# Patient Record
Sex: Female | Born: 1945 | Race: Black or African American | Hispanic: No | Marital: Married | Smoking: Never smoker
Health system: Southern US, Community
[De-identification: ages and names within clinical notes are randomized; demographics above are authoritative.]

## PROBLEM LIST (undated history)

## (undated) DIAGNOSIS — E119 Type 2 diabetes mellitus without complications: Secondary | ICD-10-CM

## (undated) DIAGNOSIS — G4730 Sleep apnea, unspecified: Secondary | ICD-10-CM

## (undated) DIAGNOSIS — I519 Heart disease, unspecified: Secondary | ICD-10-CM

## (undated) DIAGNOSIS — G9389 Other specified disorders of brain: Secondary | ICD-10-CM

## (undated) DIAGNOSIS — I729 Aneurysm of unspecified site: Secondary | ICD-10-CM

## (undated) DIAGNOSIS — I10 Essential (primary) hypertension: Secondary | ICD-10-CM

## (undated) DIAGNOSIS — M1990 Unspecified osteoarthritis, unspecified site: Secondary | ICD-10-CM

## (undated) DIAGNOSIS — M109 Gout, unspecified: Secondary | ICD-10-CM

## (undated) DIAGNOSIS — K219 Gastro-esophageal reflux disease without esophagitis: Secondary | ICD-10-CM

## (undated) DIAGNOSIS — R413 Other amnesia: Secondary | ICD-10-CM

## (undated) DIAGNOSIS — D649 Anemia, unspecified: Secondary | ICD-10-CM

## (undated) DIAGNOSIS — R569 Unspecified convulsions: Secondary | ICD-10-CM

## (undated) DIAGNOSIS — N189 Chronic kidney disease, unspecified: Secondary | ICD-10-CM

## (undated) HISTORY — DX: Other specified disorders of brain: G93.89

## (undated) HISTORY — DX: Other amnesia: R41.3

## (undated) HISTORY — DX: Aneurysm of unspecified site: I72.9

## (undated) HISTORY — DX: Unspecified osteoarthritis, unspecified site: M19.90

## (undated) HISTORY — DX: Type 2 diabetes mellitus without complications: E11.9

## (undated) HISTORY — DX: Essential (primary) hypertension: I10

## (undated) HISTORY — DX: Gout, unspecified: M10.9

## (undated) HISTORY — DX: Unspecified convulsions: R56.9

## (undated) HISTORY — DX: Heart disease, unspecified: I51.9

## (undated) HISTORY — PX: BREAST SURGERY: SHX581

## (undated) HISTORY — PX: TONSILLECTOMY: SUR1361

---

## 1976-12-19 DIAGNOSIS — I729 Aneurysm of unspecified site: Secondary | ICD-10-CM

## 1976-12-19 HISTORY — DX: Aneurysm of unspecified site: I72.9

## 2009-09-08 ENCOUNTER — Ambulatory Visit: Payer: Self-pay | Admitting: Cardiology

## 2012-10-17 DIAGNOSIS — Z79899 Other long term (current) drug therapy: Secondary | ICD-10-CM | POA: Insufficient documentation

## 2013-12-31 ENCOUNTER — Ambulatory Visit (INDEPENDENT_AMBULATORY_CARE_PROVIDER_SITE_OTHER): Payer: Medicare Other | Admitting: Cardiovascular Disease

## 2013-12-31 ENCOUNTER — Encounter: Payer: Self-pay | Admitting: Cardiovascular Disease

## 2013-12-31 VITALS — BP 126/68 | HR 73 | Ht 63.0 in | Wt 195.7 lb

## 2013-12-31 DIAGNOSIS — E119 Type 2 diabetes mellitus without complications: Secondary | ICD-10-CM

## 2013-12-31 DIAGNOSIS — Z9989 Dependence on other enabling machines and devices: Secondary | ICD-10-CM

## 2013-12-31 DIAGNOSIS — E785 Hyperlipidemia, unspecified: Secondary | ICD-10-CM

## 2013-12-31 DIAGNOSIS — G4733 Obstructive sleep apnea (adult) (pediatric): Secondary | ICD-10-CM

## 2013-12-31 DIAGNOSIS — I10 Essential (primary) hypertension: Secondary | ICD-10-CM

## 2013-12-31 DIAGNOSIS — I671 Cerebral aneurysm, nonruptured: Secondary | ICD-10-CM

## 2013-12-31 NOTE — Patient Instructions (Addendum)
Your physician has recommended that you have a sleep study. This test records several body functions during sleep, including: brain activity, eye movement, oxygen and carbon dioxide blood levels, heart rate and rhythm, breathing rate and rhythm, the flow of air through your mouth and nose, snoring, body muscle movements, and chest and belly movement.  This will be scheduled @ [facility].  Your physician recommends that you schedule a follow-up appointment in: 3 MONTHS.

## 2014-01-01 ENCOUNTER — Telehealth: Payer: Self-pay | Admitting: Cardiovascular Disease

## 2014-01-01 NOTE — Telephone Encounter (Signed)
Her husband called and wanted to know if her sleep study have been scheduled?He did not leave his name,just said he was her husband.

## 2014-01-01 NOTE — Telephone Encounter (Signed)
Forward to scheduler-deedie and kay  Has appt been made contact patient

## 2014-01-13 ENCOUNTER — Encounter: Payer: Self-pay | Admitting: Cardiovascular Disease

## 2014-01-21 ENCOUNTER — Encounter: Payer: Self-pay | Admitting: Cardiovascular Disease

## 2014-01-21 DIAGNOSIS — Z9989 Dependence on other enabling machines and devices: Secondary | ICD-10-CM

## 2014-01-21 DIAGNOSIS — I671 Cerebral aneurysm, nonruptured: Secondary | ICD-10-CM | POA: Insufficient documentation

## 2014-01-21 DIAGNOSIS — E119 Type 2 diabetes mellitus without complications: Secondary | ICD-10-CM | POA: Insufficient documentation

## 2014-01-21 DIAGNOSIS — E785 Hyperlipidemia, unspecified: Secondary | ICD-10-CM | POA: Insufficient documentation

## 2014-01-21 DIAGNOSIS — I10 Essential (primary) hypertension: Secondary | ICD-10-CM | POA: Insufficient documentation

## 2014-01-21 DIAGNOSIS — G4733 Obstructive sleep apnea (adult) (pediatric): Secondary | ICD-10-CM | POA: Insufficient documentation

## 2014-01-21 NOTE — Progress Notes (Signed)
Patient ID: Tara Holmes, female   DOB: [birth year], 68 y.o.   MRN: [id number]     HPI: Tara Holmes is a 68 y.o. female who presents to the office today for cardiology evaluation.  [name] is a 68 year old Afro-American female who has a history of hypertension for greater than 25 years, and family history for diabetes mellitus, hyperlipidemia, obesity, and is status post clipping of remote left brain aneurysm. She has a history of obstructive sleep apnea which was originally diagnosed over 7 years ago. This study had been done in [place].  Apparently, it she is now using [facility]. She needs a face-to-face evaluation in order to get supplies. Typically, she goes to bed at 9:30 to 10 PM and wakes up between 5:30 and 6:00 in the morning. Often times she wakes up at least 2 times per night. She has been using a fullface mask.  Presently, I calculated Epworth Sleepiness Scale score today which endorsed at 9. She had the highest chance of dozing while lying down to rest in the afternoon if circumstance permit as well as a passenger in a car for an hour without a break. Moderate chance of dozing while watching television and a slight chance of dozing when sitting quietly after lunch without alcohol. At times she has been without CPAP therapy. Her S 8 Elite CPAP unitis now over 7 years.  She has a history of hyperlipidemia for which she's been on Mevacor 40 mg, WelChol and fish oil capsules. Her blood pressure has been treated with losartan 100 mg, amlodipine 10 mg, as well as furosemide 40 mg. She is diabetic on Januvia.  She denies chest pressure. There is no seizure activity.  Presently, she works as a Oceanographer.  History reviewed. No pertinent past medical history.  History reviewed. No pertinent past surgical history.  No Known Allergies  Current Outpatient Prescriptions  Medication Sig Dispense Refill  . amLODipine (NORVASC) 10 MG tablet Take 10 mg  by mouth daily. Takes 1/2 tablet daily.      . Calcium Carbonate-Vitamin D (CALCIUM-VITAMIN D) 500-200 MG-UNIT per tablet Take 1 tablet by mouth 2 (two) times daily.      . colesevelam (WELCHOL) 625 MG tablet Take 625 mg by mouth 2 (two) times daily with a meal. 3 tabs      . furosemide (LASIX) 40 MG tablet Take 40 mg by mouth.      Marland Kitchen glimepiride (AMARYL) 2 MG tablet Take 2 mg by mouth daily with breakfast. Takes 1/2 tablet twice daily      . losartan (COZAAR) 100 MG tablet Take 100 mg by mouth daily.      Marland Kitchen lovastatin (MEVACOR) 40 MG tablet Take 40 mg by mouth at bedtime.      Marland Kitchen omega-3 acid ethyl esters (LOVAZA) 1 G capsule Take 1 g by mouth 2 (two) times daily.      Marland Kitchen oxybutynin (DITROPAN) 5 MG tablet Take 5 mg by mouth 2 (two) times daily.      . sitaGLIPtin (JANUVIA) 50 MG tablet Take 50 mg by mouth daily.       No current facility-administered medications for this visit.    History   Social History  . Marital Status: Married    Spouse Name: N/A    Number of Children: N/A  . Years of Education: N/A   Occupational History  . Not on file.   Social History Main Topics  . Smoking status: Never Smoker   .  Smokeless tobacco: Never Used  . Alcohol Use: No  . Drug Use: Not on file  . Sexual Activity: Not on file   Other Topics Concern  . Not on file   Social History Narrative  . No narrative on file    History reviewed. No pertinent family history.  ROS is negative for fevers, chills or night sweats. She denies headaches. She denies change in vision. She denies change in hearing. There is no presyncope or syncope. There is no angina symptoms. She denies nausea or vomiting. She denies blood in stool or urine. There is no claudication. There is no myalgias. She denies bruxism or restless legs. Without CPAP she snores. She denies definitive excessive daytime sleepiness.  Other comprehensive 14 point system review is negative.  PE BP 126/68  Pulse 73  Ht 5\' 3"  (1.6 m)  Wt 195  lb 11.2 oz (88.769 kg)  BMI 34.68 kg/m2  General: Alert, oriented, no distress.  Skin: normal turgor, no rashes HEENT: Normocephalic, atraumatic. Pupils round and reactive; sclera anicteric;no lid lag. Extraocular muscles intact. Nose without nasal septal hypertrophy Mouth/Parynx benign; Mallinpatti scale 3 Neck: No JVD, no carotid bruits; normal carotid upstroke Lungs: clear to ausculatation and percussion; no wheezing or rales Chest wall: no tenderness to palpitation Heart: RRR, s1 s2 normal 1/6 systolic murmur, unchanged; no rub or thrill or heave. Abdomen: soft, nontender; no hepatosplenomehaly, BS+; abdominal aorta nontender and not dilated by palpation. Back: no CVA tenderness Pulses 2+ Extremities: no clubbing cyanosis or edema, Homan's sign negative  Neurologic: grossly nonfocal; cranial nerves grossly normal. Psychologic: normal affect and mood.  ECG (independently read by me): Sinus rhythm. Nondiagnostic T changes.  LABS:  BMET No results found for this basename: na, k, cl, co2, glucose, bun, creatinine, calcium, gfrnonaa, gfraa     Hepatic Function Panel  No results found for this basename: prot, albumin, ast, alt, alkphos, bilitot, bilidir, ibili     CBC No results found for this basename: wbc, rbc, hgb, hct, plt, mcv, mch, mchc, rdw, neutrabs, lymphsabs, monoabs, eosabs, basosabs     BNP No results found for this basename: probnp    Lipid Panel  No results found for this basename: chol, trig, hdl, cholhdl, vldl, ldlcalc     RADIOLOGY: No results found.    ASSESSMENT AND PLAN: [name] is a 68 year old Afro-American female who has a history of diabetes mellitus, hyperlipidemia, hypertension, as well as documented obstructive sleep apnea for at least 7 years duration. Her blood pressure today is well controlled on amlodipine, losartan, and furosemide. She is tolerating lovastatin, lovaza, and WelChol for hyperlipidemia. She now is using [facility] for her  medical device company.  Her Epworth Sleepiness Scale endorsed at 9. She typically wakes up several times per night. His been over 7 years since her initial sleep study. She initially had been set at set pressure of 15 cm water pressure. A download done last year showed an AHI of 1.7 at that time she was averaging 7 hours and 32 minutes use. I am recommending a followup sleep study which will be done as a split night protocol if she meets criteria. She may benefit from a new CPAP machine. She will be getting her supplies from [facility]. I'll see her in several months for followup evaluation.     Troy Sine, MD, [facility]  01/21/2014 4:35 PM

## 2014-01-27 ENCOUNTER — Telehealth: Payer: Self-pay | Admitting: *Deleted

## 2014-01-27 NOTE — Telephone Encounter (Signed)
Returned call and pt verified x 2 w/ pt's husband, Percell Miller.  Stated he wanted to know if pt's sleep study results have been sent to [facility] in [place], [place].  Husband informed this RN is not sure of where pt is in the process and that Mariann Laster will be notified to contact him w/ an update.  Verbalized understanding and wanted to make sure info is sent to [facility] in [place], [place].  Message forwarded to [place], [place].

## 2014-01-27 NOTE — Telephone Encounter (Signed)
Pt husband called in regards to his wife's sleep study results. Also he wanted to know if Dr. Claiborne Billings sent it over to [facility] so she can get the stuff that she needs.  TK

## 2014-01-30 NOTE — Telephone Encounter (Signed)
Office note given to husband today while here getting a test. I have been unable to fax to [facility] in [place], [place] due to wrong number.

## 2014-03-19 ENCOUNTER — Ambulatory Visit (INDEPENDENT_AMBULATORY_CARE_PROVIDER_SITE_OTHER): Payer: Medicare Other | Admitting: Cardiovascular Disease

## 2014-03-19 ENCOUNTER — Encounter: Payer: Self-pay | Admitting: Cardiovascular Disease

## 2014-03-19 VITALS — BP 122/60 | HR 81 | Ht 63.0 in | Wt 193.3 lb

## 2014-03-19 DIAGNOSIS — G4733 Obstructive sleep apnea (adult) (pediatric): Secondary | ICD-10-CM

## 2014-03-19 DIAGNOSIS — E785 Hyperlipidemia, unspecified: Secondary | ICD-10-CM

## 2014-03-19 DIAGNOSIS — Z9989 Dependence on other enabling machines and devices: Principal | ICD-10-CM

## 2014-03-19 DIAGNOSIS — I10 Essential (primary) hypertension: Secondary | ICD-10-CM

## 2014-03-19 DIAGNOSIS — E119 Type 2 diabetes mellitus without complications: Secondary | ICD-10-CM

## 2014-03-19 NOTE — Patient Instructions (Signed)
Your physician recommends that you schedule a follow-up appointment in:2-3 months in a sleep clinic.

## 2014-03-26 ENCOUNTER — Telehealth: Payer: Self-pay | Admitting: Cardiovascular Disease

## 2014-03-26 NOTE — Telephone Encounter (Signed)
Please call-need a letter for Medicare for her C Pap> Please call,he will give you the details.

## 2014-03-26 NOTE — Telephone Encounter (Signed)
Forward to [place] CMA

## 2014-03-27 ENCOUNTER — Telehealth: Payer: Self-pay | Admitting: *Deleted

## 2014-03-27 NOTE — Telephone Encounter (Signed)
Faxed CPAP supply order back to [facility].

## 2014-03-27 NOTE — Telephone Encounter (Signed)
Amber can you call this patient and tell her that I sent them the signed orders late yesterday. Thanks.

## 2014-03-28 ENCOUNTER — Telehealth: Payer: Self-pay | Admitting: Cardiovascular Disease

## 2014-03-28 NOTE — Telephone Encounter (Signed)
Returned call and pt verified x 2.  Pt informed, per Mariann Laster, signed orders sent late yesterday.  Pt verbalized understanding and agreed w/ plan.

## 2014-03-28 NOTE — Telephone Encounter (Signed)
Calling back because they are asking for an additional form in order to get the c-pap machine . Please Call   Thanks

## 2014-03-28 NOTE — Telephone Encounter (Signed)
Returned call and pt verified x 2 w/ Percell Miller, pt's husband.  Stated [facility] needs another form completed for re-certification of pt's CPAP.  Stated he will have them fax it over again.  Fax number to medical records given and husband informed Mariann Laster will be notified.  Verbalized understanding.  Message forwarded to [place], [place].  Message forwarded to Medical Records to look out for fax and place on Wanda's desk once received.

## 2014-03-31 NOTE — Telephone Encounter (Signed)
Called and left message that I faxed the information requested by [facility] on 03/27/14. I telephoned [facility] and spoke with a rep informing her that I had done so. She states that she received the order form but not the questionnaire portion. Told her that I will resend the information, however the two pages were faxed at the same time. Information refaxed and receipt confirmed while on the phone with [facility].

## 2014-04-06 ENCOUNTER — Encounter: Payer: Self-pay | Admitting: Cardiovascular Disease

## 2014-04-06 NOTE — Progress Notes (Signed)
Patient ID: Tara Holmes, female   DOB: 1946/12/11, 68 y.o.   MRN: [id number]      HPI: Tara Holmes is a 68 y.o. female who presents to the office today for cardiology evaluation.  Tara Holmes is a 68 year old Afro-American female with a history of hypertension for greater than 25 years, family history for diabetes mellitus, hyperlipidemia, obesity, and is status post clipping of remote left brain aneurysm. She has a history of obstructive sleep apnea which was originally diagnosed over 7 years ago. This study had been done in [place].   RECENT  Epworth Sleepiness Scale score endorsed at 9. She had the highest chance of dozing while lying down to rest in the afternoon if circumstance permit as well as a passenger in a car for an hour without a break. Moderate chance of dozing while watching television and a slight chance of dozing when sitting quietly after lunch without alcohol. At times she has been without CPAP therapy.  When I last saw her, S 8 Elite CPAP unitis was over 52 years old and at times.  There was some intermittent malfunction.  I referred her for a noose split-night CPAP titration trial which she had done.  I do not have the specifics of this recent evaluation currently available, but this did verify continued significant obstructive sleep apnea.  She has not yet been given her new CPAP unit since she plans to use [facility] in [place], [place].  They have been contacted regarding the specifics of her recent sleep study and she will obtain a new CPAP unit and supplies.  She has a history of hyperlipidemia for which she's been on Mevacor 40 mg, WelChol and fish oil capsules. Her blood pressure has been treated with losartan 100 mg, amlodipine 10 mg, as well as furosemide 40 mg. She is diabetic on Januvia.  She denies chest pressure. There is no seizure activity.  Presently, she works as a Oceanographer.  History reviewed. No pertinent past medical  history.  History reviewed. No pertinent past surgical history.  No Known Allergies  Current Outpatient Prescriptions  Medication Sig Dispense Refill  . amLODipine (NORVASC) 10 MG tablet Take 10 mg by mouth daily. Takes 1/2 tablet daily.      . Calcium Carbonate-Vitamin D (CALCIUM-VITAMIN D) 500-200 MG-UNIT per tablet Take 1 tablet by mouth 2 (two) times daily.      . carvedilol (COREG) 25 MG tablet Take 1 tablet by mouth 2 (two) times daily.      . furosemide (LASIX) 40 MG tablet Take 40 mg by mouth.      Marland Kitchen glimepiride (AMARYL) 2 MG tablet Take 2 mg by mouth daily with breakfast. Takes 1/2 tablet twice daily      . linagliptin (TRADJENTA) 5 MG TABS tablet Take 5 mg by mouth daily.      Marland Kitchen losartan (COZAAR) 100 MG tablet Take 100 mg by mouth daily.      Marland Kitchen lovastatin (MEVACOR) 40 MG tablet Take 40 mg by mouth at bedtime.      Marland Kitchen omega-3 acid ethyl esters (LOVAZA) 1 G capsule Take 1 g by mouth 2 (two) times daily.      Marland Kitchen oxybutynin (DITROPAN) 5 MG tablet Take 5 mg by mouth 2 (two) times daily.       No current facility-administered medications for this visit.    History   Social History  . Marital Status: Married    Spouse Name: N/A    Number of Children:  N/A  . Years of Education: N/A   Occupational History  . Not on file.   Social History Main Topics  . Smoking status: Never Smoker   . Smokeless tobacco: Never Used  . Alcohol Use: No  . Drug Use: Not on file  . Sexual Activity: Not on file   Other Topics Concern  . Not on file   Social History Narrative  . No narrative on file    History reviewed. No pertinent family history.  ROS is negative for fevers, chills or night sweats. She denies headaches. She denies change in vision. She denies change in hearing.  She is unaware of lymphadenopathy There is no presyncope or syncope. There is no angina symptoms. She denies nausea or vomiting.  She denies change in bowel or bladder habits.  She denies blood in stool or urine.  There is no claudication. There is no myalgias.  She is diabetic.  She denies cold or heat intolerance. She denies bruxism or restless legs. Without CPAP she snores. She denies definitive excessive daytime sleepiness.  She denied hypnagogic hallucinations.  Other comprehensive 14 point system review is negative.  PE BP 122/60  Pulse 81  Ht 5\' 3"  (1.6 m)  Wt 193 lb 4.8 oz (87.68 kg)  BMI 34.25 kg/m2  General: Alert, oriented, no distress.  Skin: normal turgor, no rashes HEENT: Normocephalic, atraumatic. Pupils round and reactive; sclera anicteric;no lid lag. Extraocular muscles intact. Nose without nasal septal hypertrophy Mouth/Parynx benign; Mallinpatti scale 3 Neck: No JVD, no carotid bruits; normal carotid upstroke Lungs: clear to ausculatation and percussion; no wheezing or rales Chest wall: no tenderness to palpitation Heart: RRR, s1 s2 normal 1/6 systolic murmur, unchanged; no rub or thrill or heave. Abdomen: soft, nontender; no hepatosplenomehaly, BS+; abdominal aorta nontender and not dilated by palpation. Back: no CVA tenderness Pulses 2+ Extremities: no clubbing cyanosis or edema, Homan's sign negative  Neurologic: grossly nonfocal; cranial nerves grossly normal. Psychologic: normal affect and mood.  ECG (independently read by me): Normal sinus rhythm 81 beats per minute.  Nonspecific ST changes in leads 1, AVL and V6.  Prior 12/31/2013 ECG (independently read by me): Sinus rhythm. Nondiagnostic T changes.  LABS:  BMET No results found for this basename: na,  k,  cl,  co2,  glucose,  bun,  creatinine,  calcium,  gfrnonaa,  gfraa     Hepatic Function Panel  No results found for this basename: prot,  albumin,  ast,  alt,  alkphos,  bilitot,  bilidir,  ibili     CBC No results found for this basename: wbc,  rbc,  hgb,  hct,  plt,  mcv,  mch,  mchc,  rdw,  neutrabs,  lymphsabs,  monoabs,  eosabs,  basosabs     BNP No results found for this basename: probnp     Lipid Panel  No results found for this basename: chol,  trig,  hdl,  cholhdl,  vldl,  ldlcalc     RADIOLOGY: No results found.    ASSESSMENT AND PLAN: Tara Holmes is a 68 year old Afro-American female who has a history of diabetes mellitus, hyperlipidemia, hypertension, as well as documented obstructive sleep apnea for at least 7 years duration. Her blood pressure today is well controlled on amlodipine, losartan, and furosemide. She is tolerating lovastatin, lovaza, and WelChol for hyperlipidemia.  She recently underwent a followup sleep study, which again confirms significant obstructive sleep apnea.  Remotely, she had used [employer] if her in the company.  She now  tells me she will be using [facility].  We contacted [facility] to make certain, that they've received the results of her most recent sleep study and recommendation for CPAP therapy based on her CPAP titration.  I will see her back in the office in the sleep clinic following initiation of therapy and further recommendations will remain at that time.   Troy Sine, MD, [facility]  04/06/2014 6:55 PM

## 2014-04-30 NOTE — Addendum Note (Signed)
Addended byLauralee Evener. on: 04/30/2014 01:38 PM   Modules accepted: Orders

## 2014-05-02 ENCOUNTER — Telehealth: Payer: Self-pay | Admitting: *Deleted

## 2014-05-02 ENCOUNTER — Telehealth: Payer: Self-pay | Admitting: Cardiovascular Disease

## 2014-05-02 NOTE — Telephone Encounter (Signed)
Faxed order to change pressure setting to auto 11-20 cmH20 for 2 weeks. Download and fax results to [phone].

## 2014-05-02 NOTE — Telephone Encounter (Signed)
fowarded to [facility] [place]

## 2014-05-02 NOTE — Telephone Encounter (Signed)
Called and spoke with Butch Penny @ [facility] in [place]. [phone])  Requested that she have a respiratory therapist to telephone patient today @ 4 ( if possible)  to see if they can assist her with her CPAP issues. If after troubleshooting with her and they need order for pressure change then send it to Dr. Claiborne Billings for signature.

## 2014-05-02 NOTE — Telephone Encounter (Signed)
Returned a call and spoke with patients husband informing him that [facility] will be calling his wife to see if they can assist her.

## 2014-05-02 NOTE — Telephone Encounter (Signed)
Returned a call to [employer]. She recommends setting the patients machine on auto for a few weeks to see what patient is requiring. Advised them to send order for Dr. Claiborne Billings to sign. Fax number given to her.

## 2014-05-02 NOTE — Telephone Encounter (Signed)
She is stating that she sleeps about the first few hours then she wakes up and is up for the rest of the night , and that she has been snoring and she sleeps with the machine on . The number that was put in her machine was a 9 and she feels like that particular number is not working . She thinks her other machine was on 15 and she didn't have any problems with that one. She is now using a new machine . She is asking that someone please call her at 4pm and she will be near her phone . 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   Thanks

## 2014-05-02 NOTE — Telephone Encounter (Signed)
[facility] needs to speak with you about this patient.

## 2014-06-09 ENCOUNTER — Telehealth: Payer: Self-pay | Admitting: Diagnostic Neuroimaging

## 2014-06-09 NOTE — Telephone Encounter (Signed)
Spoke with patient and explained that she had not been seen in our office since 09/2012 (f/u on eval for seizure activity)and the head pain would be a new problem, suggested that she contact her PCP to have them send a new referral. she verbalized understanding and said that she would.

## 2014-06-09 NOTE — Telephone Encounter (Signed)
Patient has been having pain in her head above where she had an aneurism--not a constant ache---please call.

## 2014-06-09 NOTE — Telephone Encounter (Signed)
fyi

## 2014-06-18 ENCOUNTER — Encounter: Payer: Self-pay | Admitting: Diagnostic Neuroimaging

## 2014-06-18 ENCOUNTER — Encounter (INDEPENDENT_AMBULATORY_CARE_PROVIDER_SITE_OTHER): Payer: Self-pay

## 2014-06-18 ENCOUNTER — Ambulatory Visit (INDEPENDENT_AMBULATORY_CARE_PROVIDER_SITE_OTHER): Payer: Medicare Other | Admitting: Diagnostic Neuroimaging

## 2014-06-18 VITALS — BP 142/76 | HR 74 | Temp 97.1°F | Ht 63.0 in | Wt 197.0 lb

## 2014-06-18 DIAGNOSIS — G44209 Tension-type headache, unspecified, not intractable: Secondary | ICD-10-CM
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
## 2015-07-07 ENCOUNTER — Encounter: Payer: Self-pay | Admitting: Cardiovascular Disease

## 2015-08-11 ENCOUNTER — Telehealth: Payer: Self-pay | Admitting: *Deleted

## 2015-08-11 ENCOUNTER — Encounter: Payer: Self-pay | Admitting: Cardiovascular Disease

## 2015-08-11 NOTE — Telephone Encounter (Signed)
-----   Message from Troy Sine, MD sent at 08/11/2015 11:20 AM EDT ----- Compliant; good result with CPAP

## 2015-08-12 ENCOUNTER — Telehealth: Payer: Self-pay | Admitting: Cardiovascular Disease

## 2015-08-12 NOTE — Telephone Encounter (Signed)
Pt is returning Wanda's call from yesterday . Please f/u with her   Thanks

## 2015-08-12 NOTE — Telephone Encounter (Signed)
Routed to [place]. No answer when called patient today 8/24

## 2015-08-13 NOTE — Telephone Encounter (Signed)
Please call.

## 2015-08-13 NOTE — Telephone Encounter (Addendum)
Pt is returning Wanda's call. She said that she can be reached on her work number [phone] before 9:30am .

## 2015-08-13 NOTE — Telephone Encounter (Signed)
Routed to [place]

## 2015-08-13 NOTE — Telephone Encounter (Signed)
Pt is calling back stating that she the message can be left with her husband at [phone]

## 2015-08-13 NOTE — Telephone Encounter (Signed)
Routed to [place]

## 2015-08-14 NOTE — Telephone Encounter (Signed)
-----   Message from Troy Sine, MD sent at 08/11/2015 11:20 AM EDT ----- Compliant; good result with CPAP

## 2015-08-14 NOTE — Telephone Encounter (Signed)
Informed patient's husband CPAP compliance report is good. No changes needed at this time.

## 2016-04-28 ENCOUNTER — Ambulatory Visit: Payer: Medicare Other | Admitting: Cardiovascular Disease

## 2016-05-06 ENCOUNTER — Ambulatory Visit (INDEPENDENT_AMBULATORY_CARE_PROVIDER_SITE_OTHER): Payer: Medicare Other | Admitting: Cardiovascular Disease

## 2016-05-06 ENCOUNTER — Encounter: Payer: Self-pay | Admitting: Cardiovascular Disease

## 2016-05-06 VITALS — BP 107/56 | HR 68 | Ht 63.0 in | Wt 188.6 lb

## 2016-05-06 DIAGNOSIS — I10 Essential (primary) hypertension: Secondary | ICD-10-CM | POA: Diagnosis not present

## 2016-05-06 DIAGNOSIS — I359 Nonrheumatic aortic valve disorder, unspecified: Secondary | ICD-10-CM

## 2016-05-06 DIAGNOSIS — E119 Type 2 diabetes mellitus without complications: Secondary | ICD-10-CM

## 2016-05-06 DIAGNOSIS — G4733 Obstructive sleep apnea (adult) (pediatric): Secondary | ICD-10-CM | POA: Diagnosis not present

## 2016-05-06 DIAGNOSIS — E785 Hyperlipidemia, unspecified: Secondary | ICD-10-CM

## 2016-05-06 DIAGNOSIS — Z9989 Dependence on other enabling machines and devices: Secondary | ICD-10-CM

## 2016-05-06 NOTE — Patient Instructions (Signed)
Your physician has requested that you have an echocardiogram. Echocardiography is a painless test that uses sound waves to create images of your heart. It provides your doctor with information about the size and shape of your heart and how well your heart's chambers and valves are working. This procedure takes approximately one hour. There are no restrictions for this procedure. This will be scheduled at [facility].  Your physician wants you to follow-up in: 6 months or sooner if needed. You will receive a reminder letter in the mail two months in advance. If you don't receive a letter, please call our office to schedule the follow-up appointment.  If you need a refill on your cardiac medications before your next appointment, please call your pharmacy.

## 2016-05-08 ENCOUNTER — Encounter: Payer: Self-pay | Admitting: Cardiovascular Disease

## 2016-05-08 NOTE — Progress Notes (Signed)
Patient ID: Yamina Sponseller, female   DOB: 1946-06-05, 70 y.o.   MRN: [id number]     Primary M.D.: Dr. Quillian Quince Pomponsini  HPI: Azelyn Winker is a 70 y.o. female who presents to the office today for a 2 year follow-up cardiology evaluation.  [name] has a long history of hypertension, family history for diabetes mellitus, hyperlipidemia, obesity, and is status post clipping of remote left brain aneurysm. She has a history of obstructive sleep apnea which was originally diagnosed over 9 years ago. This study had been done in [place].  Since I last saw her, she was able to obtain a new CPAP machine and now has a ResMed air since 10.  [place]itt[place] to using CPAP with 100% compliance.  Typically she goes to bed between 9 and 9:30 PM.  She believes her sleep is restorative.  She occasionally takes a daytime nap.  She has a history of hyperlipidemia for which she's been on Mevacor 40 mg, and fish oil capsules.  She is no longer taking WelChol.  Her blood pressure has been treated with losartan 100 mg, amlodipine 10 mg, carvedilol 25 mg twice a day and furosemide 40 mg. She is diabetic and now is on glimepiride and Tradjenta.  She denies chest pressure. There is no seizure activity.  She has a known cardiac murmur.  Her last echo was in 2012.  She denies palpitations.  She denies dyspnea, PND, orthopnea.  She presents for evaluation.   Past Medical History  Diagnosis Date  . Hypertension   . Diabetes ([place])   . Aneurysm ([place]) 1978    Past Surgical History  Procedure Laterality Date  . Breast surgery Left     fibroid    No Known Allergies  Current Outpatient Prescriptions  Medication Sig Dispense Refill  . amLODipine (NORVASC) 10 MG tablet Take 10 mg by mouth daily. Takes 1/2 tablet daily.    . Calcium Carbonate-Vitamin D (CALCIUM-VITAMIN D) 500-200 MG-UNIT per tablet Take 1 tablet by mouth 2 (two) times daily.    . carvedilol (COREG) 25 MG tablet Take 1 tablet by mouth 2  (two) times daily.    . furosemide (LASIX) 40 MG tablet Take 40 mg by mouth.    Marland Kitchen glimepiride (AMARYL) 2 MG tablet Take 2 mg by mouth daily with breakfast. Takes 1/2 tablet  daily    . linagliptin (TRADJENTA) 5 MG TABS tablet Take 5 mg by mouth daily.    Marland Kitchen losartan (COZAAR) 100 MG tablet Take 100 mg by mouth daily.    Marland Kitchen lovastatin (MEVACOR) 40 MG tablet Take 40 mg by mouth at bedtime.    Marland Kitchen omega-3 acid ethyl esters (LOVAZA) 1 G capsule Take 1 g by mouth every morning.     Marland Kitchen oxybutynin (DITROPAN) 5 MG tablet Take 5 mg by mouth daily.     . traMADol (ULTRAM) 50 MG tablet Take by mouth every 4 (four) hours as needed.     No current facility-administered medications for this visit.    Social History   Social History  . Marital Status: Married    Spouse Name: Percell Miller  . Number of Children: 2  . Years of Education: MA   Occupational History  . Retired    Social History Main Topics  . Smoking status: Never Smoker   . Smokeless tobacco: Never Used  . Alcohol Use: No  . Drug Use: Not on file  . Sexual Activity: Not on file   Other Topics Concern  .  Not on file   Social History Narrative   Patient lives at home with spouse.   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  There is no tobacco, alcohol.  Family History  Problem Relation Age of Onset  . Heart attack Mother   . Diabetes Mother   . Colon cancer Father     ROS General: Negative; No fevers, chills, or night sweats;  HEENT: Negative; No changes in vision or hearing, sinus congestion, difficulty swallowing Pulmonary: Negative; No cough, wheezing, shortness of breath, hemoptysis Cardiovascular: Negative; No chest pain, presyncope, syncope, palpitations GI: Negative; No nausea, vomiting, diarrhea, or abdominal pain GU: Negative; No dysuria, hematuria, or difficulty voiding Musculoskeletal: Negative; no myalgias, joint pain, or weakness Hematologic/Oncology:  Negative; no easy bruising, bleeding Endocrine: Negative; no heat/cold intolerance; no diabetes Neuro: Negative; no changes in balance, headaches Skin: Negative; No rashes or skin lesions Psychiatric: Negative; No behavioral problems, depression Sleep: Positive for obstructive sleep apnea on CPAP therapy and she admits to 100% compliance.  No breakthrough snoring, daytime sleepiness, hypersomnolence, bruxism, restless legs, hypnogognic hallucinations, no cataplexy Other comprehensive 14 point system review is negative.   PE BP 107/56 mmHg  Pulse 68  Ht 5\' 3"  (1.6 m)  Wt 188 lb 9.6 oz (85.548 kg)  BMI 33.42 kg/m2   Wt Readings from Last 3 Encounters:  05/06/16 188 lb 9.6 oz (85.548 kg)  06/18/14 197 lb (89.359 kg)  03/19/14 193 lb 4.8 oz (87.68 kg)   General: Alert, oriented, no distress.  Skin: normal turgor, no rashes HEENT: Normocephalic, atraumatic. Pupils round and reactive; sclera anicteric;no lid lag. Extraocular muscles intact. Nose without nasal septal hypertrophy Mouth/Parynx benign; Mallinpatti scale 3 Neck: No JVD, no carotid bruits; normal carotid upstroke Lungs: clear to ausculatation and percussion; no wheezing or rales Chest wall: no tenderness to palpitation Heart: RRR, s1 s2 normal 2/6 systolic murmur in the aortic area, somewhat harsh;  no rub or thrill or heave. Abdomen: soft, nontender; no hepatosplenomehaly, BS+; abdominal aorta nontender and not dilated by palpation. Back: no CVA tenderness Pulses 2+ Extremities: no clubbing cyanosis or edema, Homan's sign negative  Neurologic: grossly nonfocal; cranial nerves grossly normal. Psychologic: normal affect and mood.  ECG (independently read by me): Normal sinus rhythm at 66 bpm.  Inferolateral T wave abnormality.  April 2015 ECG (independently read by me): Normal sinus rhythm 81 beats per minute.  Nonspecific ST changes in leads 1, AVL and V6.  Prior 12/31/2013 ECG (independently read by me): Sinus rhythm.  Nondiagnostic T changes.  LABS: No flowsheet data found.   BMET No results found for: NA   Hepatic Function Panel  No results found for: PROT   CBC No results found for: WBC   BNP No results found for: PROBNP  Lipid Panel  No results found for: CHOL   RADIOLOGY: No results found.    ASSESSMENT AND PLAN: [name] is a 70 year old Afro-American female who has a history of diabetes mellitus, hyperlipidemia, hypertension, as well as documented obstructive sleep apnea for at least 9 years duration. Her blood pressure today is well controlled on amlodipine, losartan, carvedilol, and furosemide.  On physical examination her cardiac murmur is latter and appears more harsh suggesting aortic valve disease.  Her last echo Doppler study was in 2012.  I'm scheduling her for a five-year follow-up echocardiographic evaluation to reevaluate systolic and diastolic function as well as potential for development of aortic valve stenosis.  Since I last saw her, she has received a new ResMed air since 10 CPAP machine.  She admits to 100% compliance.  There is no breakthrough snoring.  She denies hypersomnolence, but occasionally still takes intermittent naps.  She tells me she will be seeing her primary physician next week and laboratory will be obtained at that time.  I will ask that these be sent to me for my review.  She is diabetic and tolerating Tradjenta and Amaryl.  She has hyperlipidemia and is on lovastatin 40 mg and omega-3 fatty acids.  I will contact her regarding her echocardiographic results.  As long as she remains stable, I will see her in 6 months for reevaluation.  Time spent: 25 minutes  Troy Sine, MD, [facility]  05/08/2016 10:55 AM

## 2016-05-12 DIAGNOSIS — D649 Anemia, unspecified: Secondary | ICD-10-CM | POA: Insufficient documentation

## 2016-05-23 ENCOUNTER — Other Ambulatory Visit: Payer: Self-pay

## 2016-05-23 ENCOUNTER — Ambulatory Visit (HOSPITAL_COMMUNITY): Payer: Medicare Other | Attending: Cardiology

## 2016-05-23 DIAGNOSIS — E785 Hyperlipidemia, unspecified: Secondary | ICD-10-CM | POA: Insufficient documentation

## 2016-05-23 DIAGNOSIS — I119 Hypertensive heart disease without heart failure: Secondary | ICD-10-CM | POA: Insufficient documentation

## 2016-05-23 DIAGNOSIS — E119 Type 2 diabetes mellitus without complications: Secondary | ICD-10-CM | POA: Insufficient documentation

## 2016-05-23 DIAGNOSIS — I10 Essential (primary) hypertension: Secondary | ICD-10-CM

## 2016-05-23 DIAGNOSIS — Z6833 Body mass index (BMI) 33.0-33.9, adult: Secondary | ICD-10-CM | POA: Insufficient documentation

## 2016-05-23 DIAGNOSIS — I340 Nonrheumatic mitral (valve) insufficiency: Secondary | ICD-10-CM | POA: Insufficient documentation

## 2016-05-23 DIAGNOSIS — E669 Obesity, unspecified: Secondary | ICD-10-CM | POA: Diagnosis not present

## 2016-05-23 DIAGNOSIS — I359 Nonrheumatic aortic valve disorder, unspecified: Secondary | ICD-10-CM | POA: Diagnosis not present

## 2016-05-23 DIAGNOSIS — I358 Other nonrheumatic aortic valve disorders: Secondary | ICD-10-CM | POA: Diagnosis not present

## 2016-05-23 LAB — ECHOCARDIOGRAM COMPLETE
AVLVOTPG: 4 mmHg
CHL CUP TV REG PEAK VELOCITY: 242 cm/s
E/e' ratio: 11.93
EWDT: 211 ms
FS: 28 % (ref 28–44)
IVS/LV PW RATIO, ED: 1.34
LA diam index: 2.17 cm/m2
LA vol index: 29.7 mL/m2
LA vol: 59 cm3
LASIZE: 43 cm
LAVOLA4C: 52 mL
LDCA: 2.54 cm2
LEFT ATRIUM END SYS DIAM: 43 cm
LV E/e' medial: 11.93
LV E/e'average: 11.93
LV PW d: 9.58 mm — AB (ref 0.6–1.1)
LVELAT: 8.55 cm/s
LVOT SV: 58 cm3
LVOT VTI: 22.7 cm
LVOT peak vel: 95.5 cm/s
LVOTD: 18 mm
MV Dec: 211
MV Peak grad: 4 mmHg
MV pk E vel: 102 m/s
MVPKAVEL: 52.3 m/s
TDI e' lateral: 8.55
TDI e' medial: 6.14
TRMAXVEL: 242 m/s

## 2016-06-01 ENCOUNTER — Encounter: Payer: Self-pay | Admitting: *Deleted

## 2016-09-19 DIAGNOSIS — Z6837 Body mass index (BMI) 37.0-37.9, adult: Secondary | ICD-10-CM | POA: Insufficient documentation

## 2017-04-05 ENCOUNTER — Telehealth: Payer: Self-pay | Admitting: Cardiovascular Disease

## 2017-04-05 NOTE — Telephone Encounter (Signed)
Spoke to patient. She states she's been having some problems and wanted to be re-seen. She's previously seen Dr. Claiborne Billings. Has a scheduled visit on 5/21 w Dr. Claiborne Billings. I've also spoken with Dr. Danelle Berry office - they had sent a referral for patient to be re-seen by [place]. I informed them I could schedule her without a referral. Dr. Harmon Pier requested she be seen sooner than the May appt d/t concern that she may need stress test - will see if patient is amenable to visit w PA for evaluation.

## 2017-04-05 NOTE — Telephone Encounter (Signed)
Spoke to patient, I explained probably best that if Dr. Harmon Pier felt strong concern that she needed to be seen more urgently, and had not ordered a stress test, I would want to get her in this week or next w APP to follow up on the concern of chest pain. Pt refused APP visit - but was agreeable to see Dr. Claiborne Billings on Monday. Have arranged on open slot for her to come in. Pt aware to call back sooner if new concerns. She's aware for active chest pain, worsening symptoms, to go to ED.  Routed to Dr. Claiborne Billings as Juluis Rainier

## 2017-04-05 NOTE — Telephone Encounter (Signed)
New Message     Have your received order for Stress Test?? Patient states that her PCP sent it to Dr Claiborne Billings

## 2017-04-07 NOTE — Telephone Encounter (Signed)
ok 

## 2017-04-10 ENCOUNTER — Ambulatory Visit (INDEPENDENT_AMBULATORY_CARE_PROVIDER_SITE_OTHER): Payer: Medicare Other | Admitting: Cardiovascular Disease

## 2017-04-10 ENCOUNTER — Encounter: Payer: Self-pay | Admitting: Cardiovascular Disease

## 2017-04-10 VITALS — BP 118/64 | HR 67 | Ht 64.0 in | Wt 191.0 lb

## 2017-04-10 DIAGNOSIS — E1121 Type 2 diabetes mellitus with diabetic nephropathy: Secondary | ICD-10-CM

## 2017-04-10 DIAGNOSIS — G4733 Obstructive sleep apnea (adult) (pediatric): Secondary | ICD-10-CM

## 2017-04-10 DIAGNOSIS — R079 Chest pain, unspecified: Secondary | ICD-10-CM

## 2017-04-10 DIAGNOSIS — I10 Essential (primary) hypertension: Secondary | ICD-10-CM

## 2017-04-10 DIAGNOSIS — Z9989 Dependence on other enabling machines and devices: Secondary | ICD-10-CM

## 2017-04-10 DIAGNOSIS — E785 Hyperlipidemia, unspecified: Secondary | ICD-10-CM | POA: Diagnosis not present

## 2017-04-10 DIAGNOSIS — N184 Chronic kidney disease, stage 4 (severe): Secondary | ICD-10-CM

## 2017-04-10 NOTE — Patient Instructions (Addendum)
Your physician has requested that you have a lexiscan myoview. For further information please visit [email or web address]. Please follow instruction sheet, as given.  Your physician recommends that you schedule a follow-up appointment in: 2-3 months with Dr Claiborne Billings.  You will receive your test results via mail, phone call, or by My chart.

## 2017-04-10 NOTE — Progress Notes (Signed)
Patient ID: Tara Holmes, female   DOB: 1946-08-12, 71 y.o.   MRN: [id number]     Primary M.D.: Dr. Quillian Quince Pomponsini  HPI: Tara Holmes is a 71 y.o. female who presents to the office today for an 61 month follow-up cardiology evaluation.  [name] has a long history of hypertension, family history for diabetes mellitus, hyperlipidemia, obesity, and is status post clipping of remote left brain aneurysm. She has a history of obstructive sleep apnea which was originally diagnosed over 9 years ago. This study had been done in [place].  Since I last saw her, she was able to obtain a new CPAP machine and now has a ResMed air since 10.  Tara Holmes to using CPAP with 100% compliance.  Typically she goes to bed between 9 and 9:30 PM.  She believes her sleep is restorative.  She occasionally takes a daytime nap.  She has a history of hyperlipidemia for which she's been on Mevacor 40 mg, and fish oil capsules.  She is no longer taking WelChol.  Her blood pressure has been treated with losartan 100 mg, amlodipine 10 mg, carvedilol 25 mg twice a day and furosemide 40 mg. She is diabetic and now is on glimepiride and Tradjenta.  She was recently evaluated at [facility] room for chest pain on 04/02/2017.  Her chest pain was nonexertional and not described as a tightness.  It was felt to be somewhat atypical.  She saw her primary physician in follow-up the following day.  Her pain was described as sharp, which occurred in different locations on her chest nor not associated with radiation to her arms or jaw.  It would last 5 minutes and then dissipate spontaneously.  She was advised to follow-up with me for cardiology evaluation.  Since I saw her, she also has been evaluated at [facility] for chronic kidney disease stage IV.  There has been discussion for possible AV fistula formation in the very near future.  At her last evaluation.  Her serum creatinine was 3.6.  She had seen Dr.  Lissa Merlin and recently saw Dr. Adonis Housekeeper.   She presents for follow-up cardiology evaluation.  Past Medical History:  Diagnosis Date  . Aneurysm ([place]) 1978  . Diabetes ([place])   . Hypertension     Past Surgical History:  Procedure Laterality Date  . BREAST SURGERY Left    fibroid    No Known Allergies  Current Outpatient Prescriptions  Medication Sig Dispense Refill  . Calcium Carbonate-Vitamin D (CALCIUM-VITAMIN D) 500-200 MG-UNIT per tablet Take 1 tablet by mouth 2 (two) times daily.    . carvedilol (COREG) 25 MG tablet Take 1 tablet by mouth 2 (two) times daily.    . furosemide (LASIX) 40 MG tablet Take 40 mg by mouth.    Tara Holmes Kitchen glimepiride (AMARYL) 2 MG tablet Take 2 mg by mouth daily with breakfast. Takes 1/2 tablet  daily    . linagliptin (TRADJENTA) 5 MG TABS tablet Take 5 mg by mouth daily.    Tara Holmes Kitchen losartan (COZAAR) 100 MG tablet Take 100 mg by mouth daily.    Tara Holmes Kitchen lovastatin (MEVACOR) 40 MG tablet Take 40 mg by mouth at bedtime.    Tara Holmes Kitchen omega-3 acid ethyl esters (LOVAZA) 1 G capsule Take 1 g by mouth every morning.     Tara Holmes Kitchen oxybutynin (DITROPAN) 5 MG tablet Take 5 mg by mouth daily.     . RaNITidine HCl (ZANTAC PO) Take 1 tablet by mouth daily before breakfast.    .  traMADol (ULTRAM) 50 MG tablet Take by mouth every 4 (four) hours as needed.     No current facility-administered medications for this visit.     Social History   Social History  . Marital status: Married    Spouse name: Percell Miller  . Number of children: 2  . Years of education: MA   Occupational History  . Retired    Social History Main Topics  . Smoking status: Never Smoker  . Smokeless tobacco: Never Used  . Alcohol use No  . Drug use: Unknown  . Sexual activity: Not on file   Other Topics Concern  . Not on file   Social History Narrative   Patient lives at home with spouse.   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  There is no tobacco,  alcohol.  Family History  Problem Relation Age of Onset  . Heart attack Mother   . Diabetes Mother   . Colon cancer Father     ROS General: Negative; No fevers, chills, or night sweats;  HEENT: Negative; No changes in vision or hearing, sinus congestion, difficulty swallowing Pulmonary: Negative; No cough, wheezing, shortness of breath, hemoptysis Cardiovascular: Negative; No chest pain, presyncope, syncope, palpitations GI: Negative; No nausea, vomiting, diarrhea, or abdominal pain GU: Negative; No dysuria, hematuria, or difficulty voiding Musculoskeletal: Negative; no myalgias, joint pain, or weakness Hematologic/Oncology: Negative; no easy bruising, bleeding Endocrine: Negative; no heat/cold intolerance; no diabetes Neuro: Negative; no changes in balance, headaches Skin: Negative; No rashes or skin lesions Psychiatric: Negative; No behavioral problems, depression Sleep: Positive for obstructive sleep apnea on CPAP therapy and she admits to 100% compliance.  No breakthrough snoring, daytime sleepiness, hypersomnolence, bruxism, restless legs, hypnogognic hallucinations, no cataplexy Other comprehensive 14 point system review is negative.   PE BP 118/64   Pulse 67   Ht 5\' 4"  (1.626 m)   Wt 191 lb (86.6 kg)   BMI 32.79 kg/m    Repeat blood pressure by me 122/70  Wt Readings from Last 3 Encounters:  04/10/17 191 lb (86.6 kg)  05/06/16 188 lb 9.6 oz (85.5 kg)  06/18/14 197 lb (89.4 kg)   General: Alert, oriented, no distress.  Skin: normal turgor, no rashes HEENT: Normocephalic, atraumatic. Pupils round and reactive; sclera anicteric;no lid lag. Extraocular muscles intact. Nose without nasal septal hypertrophy Mouth/Parynx benign; Mallinpatti scale 3 Neck: No JVD, no carotid bruits; normal carotid upstroke Lungs: clear to ausculatation and percussion; no wheezing or rales Chest wall: no tenderness to palpitation Heart: RRR, s1 s2 normal 2/6 systolic murmur in the aortic  area, somewhat harsh;  no rub or thrill or heave. Abdomen: soft, nontender; no hepatosplenomehaly, BS+; abdominal aorta nontender and not dilated by palpation. Back: no CVA tenderness Pulses 2+ Extremities: no clubbing cyanosis or edema, Homan's sign negative  Neurologic: grossly nonfocal; cranial nerves grossly normal. Psychologic: normal affect and mood.  ECG (independently read by me): Normal sinus rhythm at 67 bpm.  Resolution of prior inferolateral T wave inversion, however, there is new T-wave inversion in leads 1 and aVL.  May 2017 ECG (independently read by me): Normal sinus rhythm at 66 bpm.  Inferolateral T wave abnormality.  April 2015 ECG (independently read by me): Normal sinus rhythm 81 beats per minute.  Nonspecific ST changes in leads 1, AVL and V6.  Prior 12/31/2013 ECG (independently read by me): Sinus rhythm. Nondiagnostic T changes.  LABS: No flowsheet data found.  BMET No results found for: NA   Hepatic Function Panel  No results found for: PROT   CBC No results found for: WBC   BNP No results found for: PROBNP  Lipid Panel  No results found for: CHOL   RADIOLOGY: No results found.  IMPRESSION:  1. Chest pain, unspecified type   2. Essential hypertension   3. Hyperlipidemia with target LDL less than 70   4. Type 2 diabetes mellitus with diabetic nephropathy, without long-term current use of insulin ([place])   5. OSA on CPAP   6. Chronic kidney disease, stage IV (severe) (HCC)     ASSESSMENT AND PLAN: [name] is a 71 year old African-American female who has a history of diabetes mellitus, hyperlipidemia, hypertension, as well as documented obstructive sleep apnea. Her blood pressure today is well controlled on losartan, carvedilol, and furosemide.  She has had diabetes mellitus for over 12 years and has developed progressive renal insufficiency with her most recent creatinine at 3.6.  She's been followed at [facility] by the nephrology department  and also had seen the vascular department for consideration of future AV fistula formation.  On physical examination her cardiac murmur is more harsh suggesting aortic valve disease.  Because of this, I scheduled her for 5 year follow-up echo Doppler study which was done in June 2017 and showed mild LVH with normal systolic function.  She had grade 2 diastolic dysfunction, moderate calcification of her aortic valve suggesting sclerosis but without stenosis, and mitral annular calcification with trivial MR. She had normal PA pressures. She received a new ResMed AirSense 10 CPAP machine last year and admits to 100% compliance.  There is no breakthrough snoring.  She denies hypersomnolence, but occasionally still takes intermittent naps.  She continues to be on lovastatin 40 mg for hyperlipidemia in addition to low Bozzo.  With her long-standing history of diabetes mellitus, and recent episodes of chest discomfort, although atypical, I have recommended that she undergo a [place] study to make certain there is no underlying ischemia.  If so, medications will need to be adjusted.  With her renal insufficiency, she is not a candidate for cardiac catheterization unless she becomes unstable or is on dialysis.  I will see her in several months for follow up evaluation.  Time spent: 25 minutes  Troy Sine, MD, [facility]  04/12/2017 6:28 PM

## 2017-04-13 ENCOUNTER — Telehealth: Payer: Self-pay | Admitting: Cardiovascular Disease

## 2017-04-13 NOTE — Telephone Encounter (Signed)
Spoke with pt she states that in the past she has had a reaction with contrast dye, she states that she gets vaginal infection every time they use it and wants to make sure it is not the same contrast they are going to use for the [facility]. Also, she is allergic to tylenol.

## 2017-04-13 NOTE — Telephone Encounter (Signed)
Need to contact nuclear medicine to verify type of contrast to be used during test,   Check available protocol for patient with prior reaction to contrast dye.    Please update allergies for this patient as well.

## 2017-04-13 NOTE — Telephone Encounter (Signed)
Pt.notified

## 2017-04-13 NOTE — Telephone Encounter (Signed)
Patient calling, states that she is scheduled for a Myocardial Perfusion on Wednesday. Patient would like to discuss some concerns from her last office visit.Thanks.

## 2017-04-13 NOTE — Telephone Encounter (Signed)
Spoke with Barnett Applebaum in nuclear imaging she states that they do not use contrast they use nuclear material that is not the same as contrast and it is ok for a pt with contrast allergy to use this.

## 2017-04-13 NOTE — Telephone Encounter (Signed)
Allergies added

## 2017-04-14 ENCOUNTER — Telehealth (HOSPITAL_COMMUNITY): Payer: Self-pay

## 2017-04-14 NOTE — Telephone Encounter (Signed)
Encounter complete. 

## 2017-04-19 ENCOUNTER — Ambulatory Visit (HOSPITAL_COMMUNITY)
Admission: RE | Admit: 2017-04-19 | Discharge: 2017-04-19 | Disposition: A | Discharge status: Home / Self Care | Payer: Medicare Other | Source: Ambulatory Visit | Attending: Cardiology | Admitting: Cardiology

## 2017-04-19 DIAGNOSIS — G4733 Obstructive sleep apnea (adult) (pediatric): Secondary | ICD-10-CM | POA: Insufficient documentation

## 2017-04-19 DIAGNOSIS — I129 Hypertensive chronic kidney disease with stage 1 through stage 4 chronic kidney disease, or unspecified chronic kidney disease: Secondary | ICD-10-CM | POA: Insufficient documentation

## 2017-04-19 DIAGNOSIS — Z8249 Family history of ischemic heart disease and other diseases of the circulatory system: Secondary | ICD-10-CM | POA: Insufficient documentation

## 2017-04-19 DIAGNOSIS — R079 Chest pain, unspecified: Secondary | ICD-10-CM | POA: Insufficient documentation

## 2017-04-19 DIAGNOSIS — E1122 Type 2 diabetes mellitus with diabetic chronic kidney disease: Secondary | ICD-10-CM | POA: Insufficient documentation

## 2017-04-19 DIAGNOSIS — E669 Obesity, unspecified: Secondary | ICD-10-CM | POA: Diagnosis not present

## 2017-04-19 DIAGNOSIS — Z8679 Personal history of other diseases of the circulatory system: Secondary | ICD-10-CM | POA: Insufficient documentation

## 2017-04-19 DIAGNOSIS — N184 Chronic kidney disease, stage 4 (severe): Secondary | ICD-10-CM | POA: Diagnosis not present

## 2017-04-19 DIAGNOSIS — I10 Essential (primary) hypertension: Secondary | ICD-10-CM

## 2017-04-19 LAB — MYOCARDIAL PERFUSION IMAGING
CHL CUP NUCLEAR SDS: 0
CHL CUP NUCLEAR SRS: 2
LV dias vol: 91 mL (ref 46–106)
LVSYSVOL: 41 mL
NUC STRESS TID: 1.05
Peak HR: 94 {beats}/min
Rest HR: 56 {beats}/min
SSS: 2

## 2017-04-19 MED ORDER — REGADENOSON 0.4 MG/5ML IV SOLN
0.4000 mg | Freq: Once | INTRAVENOUS | Status: AC
  Administered 2017-04-19: 0.4 mg via INTRAVENOUS

## 2017-04-19 MED ORDER — TECHNETIUM TC 99M TETROFOSMIN IV KIT
31.0000 | PACK | Freq: Once | INTRAVENOUS | Status: AC | PRN
  Administered 2017-04-19: 31 via INTRAVENOUS
  Filled 2017-04-19: qty 31

## 2017-04-19 MED ORDER — AMINOPHYLLINE 25 MG/ML IV SOLN
75.0000 mg | Freq: Once | INTRAVENOUS | Status: AC
  Administered 2017-04-19: 75 mg via INTRAVENOUS

## 2017-04-19 MED ORDER — TECHNETIUM TC 99M TETROFOSMIN IV KIT
10.8000 | PACK | Freq: Once | INTRAVENOUS | Status: AC | PRN
  Administered 2017-04-19: 10.8 via INTRAVENOUS
  Filled 2017-04-19: qty 11

## 2017-05-01 ENCOUNTER — Encounter: Payer: Self-pay | Admitting: *Deleted

## 2017-05-03 ENCOUNTER — Telehealth: Payer: Self-pay | Admitting: Cardiovascular Disease

## 2017-05-03 NOTE — Telephone Encounter (Signed)
New message ° ° ° ° ° °Calling to get stress test results °

## 2017-05-03 NOTE — Telephone Encounter (Signed)
Notes recorded by Troy Sine, MD on 04/30/2017 at 9:17 AM EDT Normal nuclear study  Per result note, letter was mailed to patient on 5/14 Patient called w/results

## 2017-05-08 ENCOUNTER — Ambulatory Visit: Payer: Medicare Other | Admitting: Cardiovascular Disease

## 2017-07-03 ENCOUNTER — Encounter (INDEPENDENT_AMBULATORY_CARE_PROVIDER_SITE_OTHER): Payer: Medicare Other | Admitting: Vascular Surgery

## 2017-07-18 ENCOUNTER — Ambulatory Visit (INDEPENDENT_AMBULATORY_CARE_PROVIDER_SITE_OTHER): Payer: Medicare Other | Admitting: Cardiovascular Disease

## 2017-07-18 VITALS — BP 122/74 | HR 61 | Ht 63.0 in | Wt 183.8 lb

## 2017-07-18 DIAGNOSIS — I10 Essential (primary) hypertension: Secondary | ICD-10-CM | POA: Diagnosis not present

## 2017-07-18 DIAGNOSIS — E1121 Type 2 diabetes mellitus with diabetic nephropathy: Secondary | ICD-10-CM | POA: Diagnosis not present

## 2017-07-18 DIAGNOSIS — I2510 Atherosclerotic heart disease of native coronary artery without angina pectoris: Secondary | ICD-10-CM | POA: Diagnosis not present

## 2017-07-18 DIAGNOSIS — Z9989 Dependence on other enabling machines and devices: Secondary | ICD-10-CM

## 2017-07-18 DIAGNOSIS — N184 Chronic kidney disease, stage 4 (severe): Secondary | ICD-10-CM | POA: Diagnosis not present

## 2017-07-18 DIAGNOSIS — E785 Hyperlipidemia, unspecified: Secondary | ICD-10-CM | POA: Diagnosis not present

## 2017-07-18 DIAGNOSIS — G4733 Obstructive sleep apnea (adult) (pediatric): Secondary | ICD-10-CM | POA: Diagnosis not present

## 2017-07-18 DIAGNOSIS — I359 Nonrheumatic aortic valve disorder, unspecified: Secondary | ICD-10-CM | POA: Diagnosis not present

## 2017-07-18 NOTE — Patient Instructions (Signed)
Your physician wants you to follow-up in: 1 year or sooner if needed. You will receive a reminder letter in the mail two months in advance. If you don't receive a letter, please call our office to schedule the follow-up appointment.  

## 2017-07-18 NOTE — Progress Notes (Signed)
Patient ID: Tara Holmes, female   DOB: 1945-12-31, 71 y.o.   MRN: [id number]     Primary M.D.: Dr. Quillian Quince Pomponsini  HPI: Tara Holmes is a 71 y.o. female who presents to the office today for a 4 month follow-up cardiology evaluation.  Tara Holmes has a long history of hypertension, family history for diabetes mellitus, hyperlipidemia, obesity, and is status post clipping of remote left brain aneurysm. She has a history of obstructive sleep apnea which was originally diagnosed over 9 years ago. This study had been done in [place].  Since I last saw her, she was able to obtain a new CPAP machine and now has a ResMed air since 10.  Mitts to using CPAP with 100% compliance.  Typically she goes to bed between 9 and 9:30 PM.  She believes her sleep is restorative.  She occasionally takes a daytime nap.  She has a history of hyperlipidemia for which she's been on Mevacor 40 mg, and fish oil capsules.  She is no longer taking WelChol.  Her blood pressure has been treated with losartan 100 mg, amlodipine 10 mg, carvedilol 25 mg twice a day and furosemide 40 mg. She is diabetic and now is on glimepiride and Tradjenta.  She was  evaluated at [facility] emergency room for chest pain on 04/02/2017.  Her chest pain was nonexertional and not described as a tightness.  It was felt to be somewhat atypical.  She saw her primary physician in follow-up the following day.  Her pain was described as sharp, which occurred in different locations on her chest nor not associated with radiation to her arms or jaw.  It would last 5 minutes and then dissipate spontaneously.    Since I saw her, she also has been evaluated at [facility] for chronic kidney disease stage IV.  There has been discussion for possible AV fistula formation in the very near future.  At her last evaluation.  Her serum creatinine was 3.6.  She had seen Dr. Lissa Merlin and recently saw Dr. Adonis Housekeeper.   I last saw her in April 2018 in  follow-up of her ER evaluation.  I scheduled her for [facility] study which was done on 04/19/2017.  This revealed normal perfusion without scar or ischemia.  Ejection fraction was 55%.  I reviewed her most recent echo which showed LVH with normal systolic function with grade 2 diastolic dysfunction, aortic valve sclerosis without stenosis, and mitral annular calcification with trivial PR.  She has continued to use her CPAP.  She admits to 100% compliance.  She presents for follow-up cardiology evaluation.  Past Medical History:  Diagnosis Date  . Aneurysm ([place]) 1978  . Diabetes ([place])   . Hypertension     Past Surgical History:  Procedure Laterality Date  . BREAST SURGERY Left    fibroid    Allergies  Allergen Reactions  . Acetaminophen   . Amoxicillin Other (See Comments)    Pt states allergic reaction in vaginal region.  . Contrast Media [Iodinated Diagnostic Agents]     Current Outpatient Prescriptions  Medication Sig Dispense Refill  . acyclovir (ZOVIRAX) 200 MG capsule Take 1 capsule by mouth as needed.    Marland Kitchen amLODipine (NORVASC) 10 MG tablet Take 5 mg by mouth daily.    . carvedilol (COREG) 25 MG tablet Take 1 tablet by mouth 2 (two) times daily.    . ferrous sulfate 325 (65 FE) MG EC tablet Take 2 tablets by mouth daily.  0  .  furosemide (LASIX) 40 MG tablet Pt take 1 tablet one day and 1 1/2 tablets the next day    . glimepiride (AMARYL) 2 MG tablet Take 0.5 tablets by mouth daily.    Marland Kitchen linagliptin (TRADJENTA) 5 MG TABS tablet Take 5 mg by mouth as needed.     Marland Kitchen losartan (COZAAR) 100 MG tablet Take 100 mg by mouth daily.    Marland Kitchen lovastatin (MEVACOR) 40 MG tablet Take 40 mg by mouth at bedtime.    Marland Kitchen omega-3 acid ethyl esters (LOVAZA) 1 G capsule Take 1 g by mouth every morning.     Marland Kitchen oxybutynin (DITROPAN) 5 MG tablet Take 1 tablet by mouth 2 (two) times daily.    . traMADol (ULTRAM) 50 MG tablet Take by mouth every 4 (four) hours as needed.     No current  facility-administered medications for this visit.     Social History   Social History  . Marital status: Married    Spouse name: Percell Miller  . Number of children: 2  . Years of education: MA   Occupational History  . Retired    Social History Main Topics  . Smoking status: Never Smoker  . Smokeless tobacco: Never Used  . Alcohol use No  . Drug use: Unknown  . Sexual activity: Not on file   Other Topics Concern  . Not on file   Social History Narrative   Patient lives at home with spouse.   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  There is no tobacco, alcohol.  Family History  Problem Relation Age of Onset  . Heart attack Mother   . Diabetes Mother   . Colon cancer Father     ROS General: Negative; No fevers, chills, or night sweats;  HEENT: Negative; No changes in vision or hearing, sinus congestion, difficulty swallowing Pulmonary: Negative; No cough, wheezing, shortness of breath, hemoptysis Cardiovascular: Negative; No chest pain, presyncope, syncope, palpitations GI: Negative; No nausea, vomiting, diarrhea, or abdominal pain GU: Negative; No dysuria, hematuria, or difficulty voiding Musculoskeletal: Negative; no myalgias, joint pain, or weakness Hematologic/Oncology: Negative; no easy bruising, bleeding Endocrine: Negative; no heat/cold intolerance; no diabetes Neuro: Negative; no changes in balance, headaches Skin: Negative; No rashes or skin lesions Psychiatric: Negative; No behavioral problems, depression Sleep: Positive for obstructive sleep apnea on CPAP therapy and she admits to 100% compliance.  No breakthrough snoring, daytime sleepiness, hypersomnolence, bruxism, restless legs, hypnogognic hallucinations, no cataplexy Other comprehensive 14 point system review is negative.   PE BP 122/74   Pulse 61   Ht 5\' 3"  (1.6 m)   Wt 183 lb 12.8 oz (83.4 kg)   BMI 32.56 kg/m    Repeat blood  pressure by me 122/70  Wt Readings from Last 3 Encounters:  07/18/17 183 lb 12.8 oz (83.4 kg)  04/19/17 191 lb (86.6 kg)  04/10/17 191 lb (86.6 kg)    General: Alert, oriented, no distress.  Skin: normal turgor, no rashes, warm and dry HEENT: Normocephalic, atraumatic. Pupils equal round and reactive to light; sclera anicteric; extraocular muscles intact;  Nose without nasal septal hypertrophy Mouth/Parynx benign; Mallinpatti scale 3 Neck: No JVD, no carotid bruits; normal carotid upstroke Lungs: clear to ausculatation and percussion; no wheezing or rales Chest wall: without tenderness to palpitation Heart: PMI not displaced, RRR, s1 s2 normal, 2/6 somewhat harsh systolic murmur, no diastolic murmur, no rubs, gallops, thrills, or heaves Abdomen: soft, nontender; no hepatosplenomehaly,  BS+; abdominal aorta nontender and not dilated by palpation. Back: no CVA tenderness Pulses 2+ Musculoskeletal: full range of motion, normal strength, no joint deformities Extremities: no clubbing cyanosis or edema, Homan's sign negative  Neurologic: grossly nonfocal; Cranial nerves grossly wnl Psychologic: Normal mood and affect   ECG (independently read by me): Normal sinus rhythm at 61 bpm.  T-wave abnormality in leads 1 and aVL  April 2018 ECG (independently read by me): Normal sinus rhythm at 67 bpm.  Resolution of prior inferolateral T wave inversion, however, there is new T-wave inversion in leads 1 and aVL.  May 2017 ECG (independently read by me): Normal sinus rhythm at 66 bpm.  Inferolateral T wave abnormality.  April 2015 ECG (independently read by me): Normal sinus rhythm 81 beats per minute.  Nonspecific ST changes in leads 1, AVL and V6.   12/31/2013 ECG (independently read by me): Sinus rhythm. Nondiagnostic T changes.  LABS: No flowsheet data found.   BMET No results found for: NA   Hepatic Function Panel  No results found for: PROT   CBC No results found for:  WBC   BNP No results found for: PROBNP  Lipid Panel  No results found for: CHOL   RADIOLOGY: No results found.  IMPRESSION:  1. Essential hypertension   2. CAD in native artery   3. OSA on CPAP   4. Aortic valvular disorder   5. Hyperlipidemia with target LDL less than 70   6. Chronic kidney disease, stage IV (severe) ([place])   7. Type 2 diabetes mellitus with diabetic nephropathy, without long-term current use of insulin ([place])     ASSESSMENT AND PLAN: Tara Holmes is a 71 year old African-American female who has a history of diabetes mellitus, hyperlipidemia, hypertension, as well as documented obstructive sleep apnea. Her blood pressure continues to be  well controlled on losartan, amlodipine, carvedilol, and furosemide.  She has had diabetes mellitus for over 12 years and has developed progressive renal insufficiency with her most recent creatinine at 3.6 earlier this year.  She had  been followed at [facility] by the nephrology department and also had seen the vascular department for consideration of future AV fistula formation.  She is now being followed by Dr. Marlowe Sax a nephrologist in [place] and there are no plans for an AV fistula insertion.  On physical examination, her systolic murmur is most likely contributed by her significant aortic sclerosis rather than stenosis.  At noted on her last echo.  With her recent somewhat atypical chest pain, she had undergone a nuclear perfusion study.  I reviewed this with her in detail today and this continues to be low risk and showed normal perfusion without scar or ischemia.  She has not had recurrent ischemic chest pain symptomatology, but at times notes a vague nonexertional chest ache which lasts seconds and is noncardiac.  She is sleeping well and is utilizing CPAP with 100% compliance.  She denies excessive daytime sleepiness.  Her sleep is restorative.  She is diabetic on Amaryl and only takes Tradjenta as needed and not daily.  She continues  to be on lovastatin as well as omega-3 fatty acid with lovaza.  Her ECG remained stable.  Time spent: 25 minutes  Troy Sine, MD, [facility]  07/19/2017 7:26 PM

## 2017-07-19 ENCOUNTER — Encounter: Payer: Self-pay | Admitting: Cardiovascular Disease

## 2017-09-15 IMAGING — NM NM MISC PROCEDURE
9 series · 54 of 54 positions shown · non-contrast
Comparison: none

[Series 1: wbr rest · 6.40mm/px · 6 of 64 frames shown]
[frame 6/64]
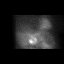
[frame 16/64]
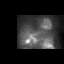
[frame 27/64]
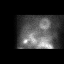
[frame 38/64]
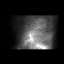
[frame 48/64]
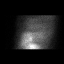
[frame 59/64]
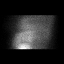

[Series 1: wbr_r-proj_st wbr rest · 6.40mm/px · 6 of 64 frames shown]
[frame 6/64]
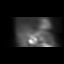
[frame 16/64]
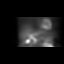
[frame 27/64]
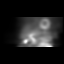
[frame 38/64]
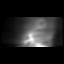
[frame 48/64]
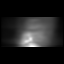
[frame 59/64]
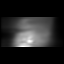

[Series 1: rest sax · 6.4mm · 6.40mm/px · 6 of 25 frames shown]
[frame 3/25]
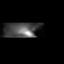
[frame 7/25]
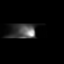
[frame 11/25]
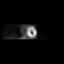
[frame 15/25]
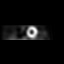
[frame 19/25]
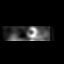
[frame 23/25]
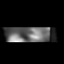

[Series 2: wbr_s-proj_st wbr stress-gsp · 6.40mm/px · 6 of 512 frames shown]
[frame 43/512]
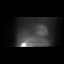
[frame 128/512]
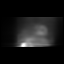
[frame 214/512]
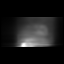
[frame 299/512]
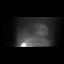
[frame 384/512]
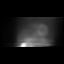
[frame 470/512]
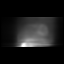

[Series 2: wbr stress-gsp · 6.40mm/px · 6 of 511 frames shown]
[frame 43/511]
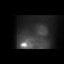
[frame 128/511]
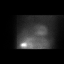
[frame 213/511]
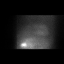
[frame 298/511]
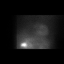
[frame 383/511]
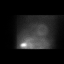
[frame 469/511]
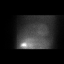

[Series 2: stress sax gs · 6.4mm · 6.40mm/px · 6 of 200 frames shown]
[frame 17/200]
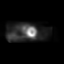
[frame 50/200]
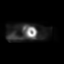
[frame 84/200]
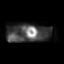
[frame 117/200]
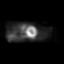
[frame 150/200]
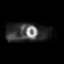
[frame 184/200]
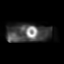

[Series 2: stress sax · 6.4mm · 6.40mm/px · 6 of 25 frames shown]
[frame 3/25]
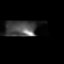
[frame 7/25]
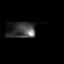
[frame 11/25]
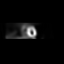
[frame 15/25]
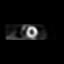
[frame 19/25]
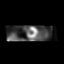
[frame 23/25]
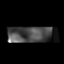

[Series 3: wbr stress-sum-em · 6.40mm/px · 6 of 64 frames shown]
[frame 6/64]
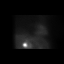
[frame 16/64]
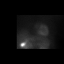
[frame 27/64]
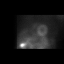
[frame 38/64]
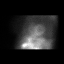
[frame 48/64]
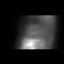
[frame 59/64]
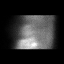

[Series 3: wbr_s-proj_st wbr stress-sum-em · 6.40mm/px · 6 of 64 frames shown]
[frame 6/64]
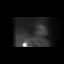
[frame 16/64]
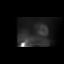
[frame 27/64]
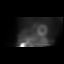
[frame 38/64]
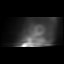
[frame 48/64]
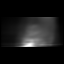
[frame 59/64]
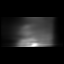

[54 of 54 positions shown; findings below may reference images not displayed]

Canned report from images found in remote index.

Refer to host system for actual result text.

## 2017-12-22 ENCOUNTER — Telehealth: Payer: Self-pay | Admitting: Cardiovascular Disease

## 2017-12-22 NOTE — Telephone Encounter (Signed)
Download obtained.   Called patient to discuss-lmtcb.

## 2017-12-22 NOTE — Telephone Encounter (Signed)
Spoke with pt, for the last 2-3 weeks she has been sleeping only 2-3 hours and then will be awake for the rest of the night. She feels it maybe related to the CPAP machine. She denies any symptoms of SOB or CP. Will forward to haley, dr [facility] nurse for direction.

## 2017-12-22 NOTE — Telephone Encounter (Signed)
New message   Pt verbalized that she is not sleeping during the night   Pt dont know if the machine needs to be checked or altered

## 2017-12-25 NOTE — Telephone Encounter (Signed)
Follow up    Patient returning call. Please call

## 2017-12-25 NOTE — Telephone Encounter (Signed)
Spoke to patient who reports x 2-3 weeks she is not sleeping well.  Reports she goes to bed as usual but wakes up and is wide awake and cannot go back to sleep.   Reports this is causing her to be sleepy during the day which she does not like.   Denies any change in habits or increase in stress, etc.   Patient is concerned this may be related to her CPAP machine and states this has not been checked in over a year.   Advised download has been obtained and will have Dr. Claiborne Billings review/make recommendations.

## 2017-12-25 NOTE — Telephone Encounter (Signed)
I have reviewed a download from 11/22/2017 through 12/21/2017.  AHI is excellent at 1.3.  She is averaging 5 hours and 58 minutes of CPAP usage daily.  There is no significant leak.  Compliance is excellent.  Ideally should have increased sleep duration.  Consider optimization of sleep hygiene.

## 2017-12-26 NOTE — Telephone Encounter (Signed)
Patient aware and verbalized understanding. °

## 2018-04-23 ENCOUNTER — Encounter (INDEPENDENT_AMBULATORY_CARE_PROVIDER_SITE_OTHER): Payer: Medicare Other | Admitting: Vascular Surgery

## 2018-04-30 ENCOUNTER — Encounter (INDEPENDENT_AMBULATORY_CARE_PROVIDER_SITE_OTHER): Payer: Self-pay | Admitting: Vascular Surgery

## 2018-04-30 ENCOUNTER — Ambulatory Visit (INDEPENDENT_AMBULATORY_CARE_PROVIDER_SITE_OTHER): Payer: Medicare Other | Admitting: Vascular Surgery

## 2018-04-30 VITALS — BP 136/67 | HR 71 | Resp 16 | Ht 63.0 in | Wt 179.4 lb

## 2018-04-30 DIAGNOSIS — E1121 Type 2 diabetes mellitus with diabetic nephropathy: Secondary | ICD-10-CM

## 2018-04-30 DIAGNOSIS — N185 Chronic kidney disease, stage 5: Secondary | ICD-10-CM | POA: Diagnosis not present

## 2018-04-30 DIAGNOSIS — I10 Essential (primary) hypertension: Secondary | ICD-10-CM

## 2018-04-30 DIAGNOSIS — E785 Hyperlipidemia, unspecified: Secondary | ICD-10-CM

## 2018-05-01 ENCOUNTER — Encounter (INDEPENDENT_AMBULATORY_CARE_PROVIDER_SITE_OTHER): Payer: Self-pay | Admitting: Vascular Surgery

## 2018-05-01 DIAGNOSIS — N185 Chronic kidney disease, stage 5: Secondary | ICD-10-CM | POA: Insufficient documentation

## 2018-05-01 NOTE — Progress Notes (Signed)
MRN : [id number]  Tara Holmes is a 72 y.o. (23-May-1946) female who presents with chief complaint of  Chief Complaint  Patient presents with  . New Patient (Initial Visit)    ref Marlowe Sax for Fistula  .  History of Present Illness: The patient is seen for evaluation for dialysis access. 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 The patient's most recent creatinine clearance is less than 20. The patient volume status has not yet become an issue. Patient's blood pressures been relatively well controlled. There are mild uremic symptoms which appear to be relatively well tolerated at this time. The patient is right-handed.  The patient has been considering the various methods of dialysis and wishes to proceed with hemodialysis and therefore creation of AV access.  The patient denies amaurosis fugax or recent TIA symptoms. There are no recent neurological changes noted. The patient denies claudication symptoms or rest pain symptoms. The patient denies history of DVT, PE or superficial thrombophlebitis. The patient denies recent episodes of angina or shortness of breath.   Current Meds  Medication Sig  . amLODipine (NORVASC) 10 MG tablet Take 5 mg by mouth daily.  . carvedilol (COREG) 25 MG tablet Take 1 tablet by mouth 2 (two) times daily.  Marland Kitchen ezetimibe (ZETIA) 10 MG tablet ezetimibe 10 mg tablet  Take 1 tablet every day by oral route.  . ferrous sulfate 325 (65 FE) MG EC tablet Take 2 tablets by mouth daily.  . furosemide (LASIX) 40 MG tablet Pt take 1 tablet one day and 1 1/2 tablets the next day  . glimepiride (AMARYL) 2 MG tablet Take 0.5 tablets by mouth daily.  Marland Kitchen losartan (COZAAR) 100 MG tablet Take 100 mg by mouth daily.  Marland Kitchen omega-3 acid ethyl esters (LOVAZA) 1 G capsule Take 1 g by mouth every morning.   Marland Kitchen oxybutynin (DITROPAN) 5 MG tablet Take 1 tablet by mouth 2 (two) times daily.  . ranitidine (ZANTAC) 150 MG capsule  Take 150 mg by mouth daily.  . rosuvastatin (CRESTOR) 10 MG tablet Take 10 mg by mouth daily.    Past Medical History:  Diagnosis Date  . Aneurysm ([place]) 1978  . Diabetes ([place])   . Hypertension     Past Surgical History:  Procedure Laterality Date  . BREAST SURGERY Left    fibroid    Social History Social History   Tobacco Use  . Smoking status: Never Smoker  . Smokeless tobacco: Never Used  Substance Use Topics  . Alcohol use: No  . Drug use: Not on file    Family History Family History  Problem Relation Age of Onset  . Heart attack Mother   . Diabetes Mother   . Colon cancer Father   No family history of bleeding/clotting disorders, porphyria or autoimmune disease   Allergies  Allergen Reactions  . Acetaminophen   . Amoxicillin Other (See Comments)    Pt states allergic reaction in vaginal region.  . Atorvastatin   . Prednisone   . Contrast Media [Iodinated Diagnostic Agents]      REVIEW OF SYSTEMS (Negative unless checked)  Constitutional: [] Weight loss  [] Fever  [] Chills Cardiac: [] Chest pain   [] Chest pressure   [] Palpitations   [] Shortness of breath when laying flat   [] Shortness of breath with exertion. Vascular:  [] Pain in legs with walking   [] Pain in legs at rest  [] History of DVT   [] Phlebitis   [] Swelling in legs   []   Varicose veins   [] Non-healing ulcers Pulmonary:   [] Uses home oxygen   [] Productive cough   [] Hemoptysis   [] Wheeze  [] COPD   [] Asthma Neurologic:  [] Dizziness   [] Seizures   [] History of stroke   [] History of TIA  [] Aphasia   [] Vissual changes   [] Weakness or numbness in arm   [] Weakness or numbness in leg Musculoskeletal:   [] Joint swelling   [] Joint pain   [] Low back pain Hematologic:  [] Easy bruising  [] Easy bleeding   [] Hypercoagulable state   [] Anemic Gastrointestinal:  [] Diarrhea   [] Vomiting  [] Gastroesophageal reflux/heartburn   [] Difficulty swallowing. Genitourinary:  [x] Chronic kidney disease   [] Difficult urination   [] Frequent urination   [] Blood in urine Skin:  [] Rashes   [] Ulcers  Psychological:  [] History of anxiety   []  History of major depression.  Physical Examination  Vitals:   04/30/18 1423  BP: 136/67  Pulse: 71  Resp: 16  Weight: 179 lb 6.4 oz (81.4 kg)  Height: 5\' 3"  (1.6 m)   Body mass index is 31.78 kg/m. Gen: WD/WN, NAD Head: [place]/AT, No temporalis wasting.  Ear/Nose/Throat: Hearing grossly intact, nares w/o erythema or drainage, poor dentition Eyes: PER, EOMI, sclera nonicteric.  Neck: Supple, no masses.  No bruit or JVD.  Pulmonary:  Good air movement, clear to auscultation bilaterally, no use of accessory muscles.  Cardiac: RRR, normal S1, S2, no Murmurs. Vascular: Superficial veins bilateral upper extremities not readily apparent, 2+ bilateral ankle edema Vessel Right Left  Radial Palpable Palpable  Ulnar Palpable Palpable  Brachial Palpable Palpable  Gastrointestinal: soft, non-distended. No guarding/no peritoneal signs.  Musculoskeletal: M/S 5/5 throughout.  No deformity or atrophy.  Neurologic: CN 2-12 intact. Pain and light touch intact in extremities.  Symmetrical.  Speech is fluent. Motor exam as listed above. Psychiatric: Judgment intact, Mood & affect appropriate for pt's clinical situation. Dermatologic: No rashes or ulcers noted.  No changes consistent with cellulitis. Lymph : No Cervical lymphadenopathy, no lichenification or skin changes of chronic lymphedema.  CBC No results found for: WBC, HGB, HCT, MCV, PLT  BMET No results found for: NA, K, CL, CO2, GLUCOSE, BUN, CREATININE, CALCIUM, GFRNONAA, GFRAA CrCl cannot be calculated (No order found.).  COAG No results found for: INR, PROTIME  Radiology No results found.    Assessment/Plan 1. Chronic kidney disease, stage V ([place]) Recommend:  The patient currently has catheter based access.  Patient should have vein mapping of his arms to plan for upper extremity access creation.  The goal is to  improve the quality of dialysis therapy.  The risks, benefits and alternative therapies with respect to to the different kinds of dialysis accesee were reviewed in detail with the patient.  All questions were answered.  The patient agrees to proceed with mapping.     A total of 40 minutes was spent with this patient and greater than 50% was spent in counseling and coordination of care with the patient.  Discussion included the treatment options for vascular access crreation including indications for surgery and intervention.  Also discussed is the appropriate timing of treatment.  In addition medical therapy was discussed.  - VAS [place] UPPER EXT VEIN MAPPING (PRE-OP AVF); Future  2. Essential hypertension Continue antihypertensive medications as already ordered, these medications have been reviewed and there are no changes at this time.   3. Type 2 diabetes mellitus with diabetic nephropathy, without long-term current use of insulin (HCC) Continue hypoglycemic medications as already ordered, these medications have been reviewed and  there are no changes at this time.  Hgb A1C to be monitored as already arranged by primary service   4. Hyperlipidemia with target LDL less than 70 Continue statin as ordered and reviewed, no changes at this time     Hortencia Pilar, MD  05/01/2018 9:46 AM

## 2018-05-23 ENCOUNTER — Encounter (INDEPENDENT_AMBULATORY_CARE_PROVIDER_SITE_OTHER): Payer: Self-pay

## 2018-05-23 ENCOUNTER — Other Ambulatory Visit (INDEPENDENT_AMBULATORY_CARE_PROVIDER_SITE_OTHER): Payer: Self-pay | Admitting: Vascular Surgery

## 2018-05-23 DIAGNOSIS — N186 End stage renal disease: Secondary | ICD-10-CM

## 2018-05-24 ENCOUNTER — Ambulatory Visit (INDEPENDENT_AMBULATORY_CARE_PROVIDER_SITE_OTHER): Payer: Medicare Other

## 2018-05-24 ENCOUNTER — Encounter (INDEPENDENT_AMBULATORY_CARE_PROVIDER_SITE_OTHER): Payer: Self-pay | Admitting: Vascular Surgery

## 2018-05-24 ENCOUNTER — Ambulatory Visit (INDEPENDENT_AMBULATORY_CARE_PROVIDER_SITE_OTHER): Payer: Medicare Other | Admitting: Vascular Surgery

## 2018-05-24 VITALS — BP 105/59 | HR 67 | Resp 16 | Ht 63.0 in | Wt 182.0 lb

## 2018-05-24 DIAGNOSIS — N186 End stage renal disease: Secondary | ICD-10-CM

## 2018-05-24 DIAGNOSIS — E1121 Type 2 diabetes mellitus with diabetic nephropathy: Secondary | ICD-10-CM | POA: Diagnosis not present

## 2018-05-24 DIAGNOSIS — N185 Chronic kidney disease, stage 5: Secondary | ICD-10-CM

## 2018-05-24 DIAGNOSIS — I10 Essential (primary) hypertension: Secondary | ICD-10-CM | POA: Diagnosis not present

## 2018-05-24 DIAGNOSIS — E785 Hyperlipidemia, unspecified: Secondary | ICD-10-CM

## 2018-05-24 NOTE — Progress Notes (Signed)
MRN : [id number]  Tara Holmes is a 72 y.o. (10-16-1946) female who presents with chief complaint of  Chief Complaint  Patient presents with  . Follow-up    Vein mapping  .  History of Present Illness:   The patient is seen for follow up regarding dialysis access. 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 The patient's most recent creatinine clearance is 16.6. The patient volume status has not yet become an issue. Patient's blood pressures been relatively well controlled. There are mild uremic symptoms which appear to be relatively well tolerated at this time. The patient is right-handed.  The patient has been considering the various methods of dialysis and wishes to proceed with hemodialysis and therefore creation of AV access.  The patient denies amaurosis fugax or recent TIA symptoms. There are no recent neurological changes noted. The patient denies claudication symptoms or rest pain symptoms. The patient denies history of DVT, PE or superficial thrombophlebitis. The patient denies recent episodes of angina or shortness of breath.   Vein mapping today shows she is a candidate for a WavelinQ left arm only  Overall the veins are inadequate for a fistula on the right.  Current Meds  Medication Sig  . amLODipine (NORVASC) 10 MG tablet Take 5 mg by mouth daily.  . carvedilol (COREG) 25 MG tablet Take 1 tablet by mouth 2 (two) times daily.  Marland Kitchen ezetimibe (ZETIA) 10 MG tablet ezetimibe 10 mg tablet  Take 1 tablet every day by oral route.  . ferrous sulfate 325 (65 FE) MG EC tablet Take 2 tablets by mouth daily.  . furosemide (LASIX) 40 MG tablet 40 mg. Pt take 1 tablet one day and 1 1/2 tablets the next day  . glimepiride (AMARYL) 2 MG tablet Take 0.5 tablets by mouth daily.   Marland Kitchen losartan (COZAAR) 100 MG tablet Take 100 mg by mouth daily.  Marland Kitchen omega-3 acid ethyl esters (LOVAZA) 1 G capsule Take 1 g by mouth every morning.   Marland Kitchen  oxybutynin (DITROPAN) 5 MG tablet Take 1 tablet by mouth 2 (two) times daily.  . ranitidine (ZANTAC) 150 MG capsule Take 150 mg by mouth daily.  . rosuvastatin (CRESTOR) 10 MG tablet Take 10 mg by mouth daily.  . traMADol (ULTRAM) 50 MG tablet Take by mouth every 4 (four) hours as needed.    Past Medical History:  Diagnosis Date  . Aneurysm ([place]) 1978  . Diabetes ([place])   . Hypertension     Past Surgical History:  Procedure Laterality Date  . BREAST SURGERY Left    fibroid    Social History Social History   Tobacco Use  . Smoking status: Never Smoker  . Smokeless tobacco: Never Used  Substance Use Topics  . Alcohol use: No  . Drug use: Not on file    Family History Family History  Problem Relation Age of Onset  . Heart attack Mother   . Diabetes Mother   . Colon cancer Father     Allergies  Allergen Reactions  . Acetaminophen   . Amoxicillin Other (See Comments)    Pt states allergic reaction in vaginal region.  . Atorvastatin   . Prednisone   . Contrast Media [Iodinated Diagnostic Agents]      REVIEW OF SYSTEMS (Negative unless checked)  Constitutional: [] Weight loss  [] Fever  [] Chills Cardiac: [] Chest pain   [] Chest pressure   [] Palpitations   [] Shortness of breath when laying flat   [] Shortness  of breath with exertion. Vascular:  [] Pain in legs with walking   [] Pain in legs at rest  [] History of DVT   [] Phlebitis   [] Swelling in legs   [] Varicose veins   [] Non-healing ulcers Pulmonary:   [] Uses home oxygen   [] Productive cough   [] Hemoptysis   [] Wheeze  [] COPD   [] Asthma Neurologic:  [] Dizziness   [] Seizures   [] History of stroke   [] History of TIA  [] Aphasia   [] Vissual changes   [] Weakness or numbness in arm   [] Weakness or numbness in leg Musculoskeletal:   [] Joint swelling   [] Joint pain   [] Low back pain Hematologic:  [] Easy bruising  [] Easy bleeding   [] Hypercoagulable state   [] Anemic Gastrointestinal:  [] Diarrhea   [] Vomiting  [] Gastroesophageal  reflux/heartburn   [] Difficulty swallowing. Genitourinary:  [x] Chronic kidney disease   [] Difficult urination  [] Frequent urination   [] Blood in urine Skin:  [] Rashes   [] Ulcers  Psychological:  [] History of anxiety   []  History of major depression.  Physical Examination  Vitals:   05/24/18 1537  BP: (!) 105/59  Pulse: 67  Resp: 16  Weight: 182 lb (82.6 kg)  Height: 5\' 3"  (1.6 m)   Body mass index is 32.24 kg/m. Gen: WD/WN, NAD Head: [place]/AT, No temporalis wasting.  Ear/Nose/Throat: Hearing grossly intact, nares w/o erythema or drainage Eyes: PER, EOMI, sclera nonicteric.  Neck: Supple, no large masses.   Pulmonary:  Good air movement, no audible wheezing bilaterally, no use of accessory muscles.  Cardiac: RRR, no JVD Vascular:  Vessel Right Left  Radial Palpable Palpable  Ulnar Palpable Palpable  Brachial Palpable Palpable  Gastrointestinal: Non-distended. No guarding/no peritoneal signs.  Musculoskeletal: M/S 5/5 throughout.  No deformity or atrophy.  Neurologic: CN 2-12 intact. Symmetrical.  Speech is fluent. Motor exam as listed above. Psychiatric: Judgment intact, Mood & affect appropriate for pt's clinical situation. Dermatologic: No rashes or ulcers noted.  No changes consistent with cellulitis. Lymph : No lichenification or skin changes of chronic lymphedema.  CBC No results found for: WBC, HGB, HCT, MCV, PLT  BMET No results found for: NA, K, CL, CO2, GLUCOSE, BUN, CREATININE, CALCIUM, GFRNONAA, GFRAA CrCl cannot be calculated (No order found.).  COAG No results found for: INR, PROTIME  Radiology No results found.    Assessment/Plan 1. Chronic kidney disease, stage V ([place]) Recommend:  At this time the patient does not have appropriate extremity access for dialysis  Patient should have a left arm WavelnQ created. I have discussed with her that I will plan to have to 2-3 case scheduled together and so we will call her to schedule at a later date.  The  patient was also informed that the procedure is done under anesthesia and she will need to have PAT  The risks, benefits and alternative therapies were reviewed in detail with the patient.  All questions were answered.  The patient agrees to proceed with surgery.    2. Type 2 diabetes mellitus with diabetic nephropathy, without long-term current use of insulin ([place]) Continue hypoglycemic medications as already ordered, these medications have been reviewed and there are no changes at this time.  Hgb A1C to be monitored as already arranged by primary service   3. Essential hypertension Continue antihypertensive medications as already ordered, these medications have been reviewed and there are no changes at this time.   4. Hyperlipidemia with target LDL less than 70 Continue statin as ordered and reviewed, no changes at this time  Hortencia Pilar, MD  05/24/2018 9:35 PM

## 2018-07-13 ENCOUNTER — Encounter (INDEPENDENT_AMBULATORY_CARE_PROVIDER_SITE_OTHER): Payer: Self-pay

## 2018-07-30 ENCOUNTER — Other Ambulatory Visit (INDEPENDENT_AMBULATORY_CARE_PROVIDER_SITE_OTHER): Payer: Self-pay | Admitting: Nurse Practitioner

## 2018-08-01 ENCOUNTER — Other Ambulatory Visit (INDEPENDENT_AMBULATORY_CARE_PROVIDER_SITE_OTHER): Payer: Self-pay | Admitting: Vascular Surgery

## 2018-08-01 ENCOUNTER — Encounter
Admission: RE | Admit: 2018-08-01 | Discharge: 2018-08-01 | Disposition: A | Discharge status: Home / Self Care | Payer: Medicare Other | Source: Ambulatory Visit | Attending: Vascular Surgery | Admitting: Vascular Surgery

## 2018-08-01 ENCOUNTER — Other Ambulatory Visit: Payer: Self-pay

## 2018-08-01 DIAGNOSIS — Z01812 Encounter for preprocedural laboratory examination: Secondary | ICD-10-CM | POA: Insufficient documentation

## 2018-08-01 DIAGNOSIS — R9431 Abnormal electrocardiogram [ECG] [EKG]: Secondary | ICD-10-CM | POA: Insufficient documentation

## 2018-08-01 DIAGNOSIS — R001 Bradycardia, unspecified: Secondary | ICD-10-CM | POA: Diagnosis not present

## 2018-08-01 DIAGNOSIS — Z01818 Encounter for other preprocedural examination: Secondary | ICD-10-CM | POA: Diagnosis not present

## 2018-08-01 DIAGNOSIS — Z0183 Encounter for blood typing: Secondary | ICD-10-CM | POA: Diagnosis not present

## 2018-08-01 HISTORY — DX: Chronic kidney disease, unspecified: N18.9

## 2018-08-01 HISTORY — DX: Gastro-esophageal reflux disease without esophagitis: K21.9

## 2018-08-01 HISTORY — DX: Anemia, unspecified: D64.9

## 2018-08-01 HISTORY — DX: Sleep apnea, unspecified: G47.30

## 2018-08-01 LAB — CBC WITH DIFFERENTIAL/PLATELET
Basophils Absolute: 0 10*3/uL (ref 0–0.1)
Basophils Relative: 1 %
EOS PCT: 2 %
Eosinophils Absolute: 0.1 10*3/uL (ref 0–0.7)
HCT: 30.1 % — ABNORMAL LOW (ref 35.0–47.0)
Hemoglobin: 10.3 g/dL — ABNORMAL LOW (ref 12.0–16.0)
LYMPHS ABS: 1.6 10*3/uL (ref 1.0–3.6)
LYMPHS PCT: 25 %
MCH: 28.7 pg (ref 26.0–34.0)
MCHC: 34.1 g/dL (ref 32.0–36.0)
MCV: 84.2 fL (ref 80.0–100.0)
MONO ABS: 0.7 10*3/uL (ref 0.2–0.9)
MONOS PCT: 10 %
Neutro Abs: 4.2 10*3/uL (ref 1.4–6.5)
Neutrophils Relative %: 62 %
PLATELETS: 215 10*3/uL (ref 150–440)
RBC: 3.58 MIL/uL — ABNORMAL LOW (ref 3.80–5.20)
RDW: 14 % (ref 11.5–14.5)
WBC: 6.6 10*3/uL (ref 3.6–11.0)

## 2018-08-01 LAB — BASIC METABOLIC PANEL
Anion gap: 7 (ref 5–15)
BUN: 54 mg/dL — AB (ref 8–23)
CHLORIDE: 106 mmol/L (ref 98–111)
CO2: 29 mmol/L (ref 22–32)
Calcium: 9.6 mg/dL (ref 8.9–10.3)
Creatinine, Ser: 2.84 mg/dL — ABNORMAL HIGH (ref 0.44–1.00)
GFR calc Af Amer: 18 mL/min — ABNORMAL LOW (ref >60)
GFR calc non Af Amer: 16 mL/min — ABNORMAL LOW (ref >60)
GLUCOSE: 137 mg/dL — AB (ref 70–99)
POTASSIUM: 4.1 mmol/L (ref 3.5–5.1)
Sodium: 142 mmol/L (ref 135–145)

## 2018-08-01 LAB — PROTIME-INR
INR: 1.06
Prothrombin Time: 13.7 seconds (ref 11.4–15.2)

## 2018-08-01 LAB — APTT: APTT: 31 s (ref 24–36)

## 2018-08-01 LAB — TYPE AND SCREEN
ABO/RH(D): O POS
Antibody Screen: NEGATIVE

## 2018-08-01 NOTE — Pre-Procedure Instructions (Signed)
EKG COMPARED WITH 2018. ALSO NEGATIVE STRESS 5/18 IN EPIC

## 2018-08-01 NOTE — Patient Instructions (Signed)
Your procedure is scheduled on: August 08, 2018 [facility] Report to [place] To find out your arrival time, please call [phone] LAURA REMEMBER: Instructions that are not followed completely may result in serious medical risk, up to and including death; or upon the discretion of your surgeon and anesthesiologist your surgery may need to be rescheduled.  Do not eat food OR DRINK ANYTHING after midnight the night before surgery.  No gum chewing, lozengers or hard candies.  No Alcohol for 24 hours before or after surgery.  No Smoking including e-cigarettes for 24 hours prior to surgery.  No chewable tobacco products for at least 6 hours prior to surgery.  No nicotine patches on the day of surgery.  On the morning of surgery brush your teeth with toothpaste and water, you may rinse your mouth with mouthwash if you wish. Do not swallow any toothpaste or mouthwash.  Notify your doctor if there is any change in your medical condition (cold, fever, infection).  Do not wear jewelry, make-up, hairpins, clips or nail polish.  Do not wear lotions, powders, or perfumes. You may NOT wear deodorant.  Do not shave 48 hours prior to surgery. Men may shave face and neck.  Contacts and dentures may not be worn into surgery.  Do not bring valuables to the hospital, including drivers license, insurance or credit cards.  [facility] is not responsible for any belongings or valuables.   TAKE THESE MEDICATIONS THE MORNING OF SURGERY: CARVEDILOL AMLODIPINE ZETIA OXYBUTYNIN  Use CHG Soap or wipes as directed on instruction sheet.  Bring your C-PAP to the hospital with you in case you may have to spend the night.   Follow recommendations from Cardiologist, Pulmonologist or PCP regarding stopping Aspirin, Coumadin, Plavix, Eliquis, Pradaxa, or Pletal.  Stop Anti-inflammatories (NSAIDS) such as Advil, Aleve, Ibuprofen, Motrin, Naproxen, Naprosyn and Aspirin based products such as  Excedrin, Goodys Powder, BC Powder NO INDOMETHACIN (May take Tylenol or Acetaminophen if needed.)  Stop ANY OVER THE COUNTER supplements until after surgery. OMEGA -3  (May continue FERROUS SULFATE Vitamin D, Vitamin B, and multivitamin.)  Wear comfortable clothing (specific to your surgery type) to the hospital.  Plan for stool softeners for home use.   If you are being discharged the day of surgery, you will not be allowed to drive home. You will need a responsible adult to drive you home and stay with you that night.   If you are taking public transportation, you will need to have a responsible adult with you. Please confirm with your physician that it is acceptable to use public transportation.   Please call [phone] if you have any questions about these instructions.

## 2018-08-07 MED ORDER — VANCOMYCIN HCL IN DEXTROSE 1-5 GM/200ML-% IV SOLN
1000.0000 mg | INTRAVENOUS | Status: AC
  Administered 2018-08-08: 1000 mg via INTRAVENOUS
  Filled 2018-08-07: qty 200

## 2018-08-07 MED ORDER — METHYLPREDNISOLONE SODIUM SUCC 125 MG IJ SOLR
125.0000 mg | Freq: Once | INTRAMUSCULAR | Status: AC
  Administered 2018-08-08: 125 mg via INTRAVENOUS

## 2018-08-08 ENCOUNTER — Ambulatory Visit: Payer: Medicare Other | Admitting: Certified Registered Nurse Anesthetist

## 2018-08-08 ENCOUNTER — Encounter
Admission: RE | Disposition: A | Discharge status: Home / Self Care | Payer: Self-pay | Source: Ambulatory Visit | Attending: Vascular Surgery

## 2018-08-08 ENCOUNTER — Ambulatory Visit
Admission: RE | Admit: 2018-08-08 | Discharge: 2018-08-08 | Disposition: A | Discharge status: Home / Self Care | Payer: Medicare Other | Source: Ambulatory Visit | Attending: Vascular Surgery | Admitting: Vascular Surgery

## 2018-08-08 ENCOUNTER — Ambulatory Visit: Payer: Medicare Other

## 2018-08-08 DIAGNOSIS — E1122 Type 2 diabetes mellitus with diabetic chronic kidney disease: Secondary | ICD-10-CM | POA: Diagnosis not present

## 2018-08-08 DIAGNOSIS — G4730 Sleep apnea, unspecified: Secondary | ICD-10-CM | POA: Insufficient documentation

## 2018-08-08 DIAGNOSIS — Z880 Allergy status to penicillin: Secondary | ICD-10-CM | POA: Diagnosis not present

## 2018-08-08 DIAGNOSIS — Z9889 Other specified postprocedural states: Secondary | ICD-10-CM | POA: Diagnosis not present

## 2018-08-08 DIAGNOSIS — Z833 Family history of diabetes mellitus: Secondary | ICD-10-CM | POA: Insufficient documentation

## 2018-08-08 DIAGNOSIS — E785 Hyperlipidemia, unspecified: Secondary | ICD-10-CM | POA: Diagnosis not present

## 2018-08-08 DIAGNOSIS — E11649 Type 2 diabetes mellitus with hypoglycemia without coma: Secondary | ICD-10-CM | POA: Diagnosis not present

## 2018-08-08 DIAGNOSIS — Z8679 Personal history of other diseases of the circulatory system: Secondary | ICD-10-CM | POA: Insufficient documentation

## 2018-08-08 DIAGNOSIS — I120 Hypertensive chronic kidney disease with stage 5 chronic kidney disease or end stage renal disease: Secondary | ICD-10-CM | POA: Diagnosis not present

## 2018-08-08 DIAGNOSIS — E119 Type 2 diabetes mellitus without complications: Secondary | ICD-10-CM

## 2018-08-08 DIAGNOSIS — N186 End stage renal disease: Secondary | ICD-10-CM | POA: Diagnosis present

## 2018-08-08 DIAGNOSIS — K219 Gastro-esophageal reflux disease without esophagitis: Secondary | ICD-10-CM | POA: Diagnosis not present

## 2018-08-08 DIAGNOSIS — N185 Chronic kidney disease, stage 5: Secondary | ICD-10-CM

## 2018-08-08 DIAGNOSIS — I10 Essential (primary) hypertension: Secondary | ICD-10-CM

## 2018-08-08 DIAGNOSIS — Z888 Allergy status to other drugs, medicaments and biological substances status: Secondary | ICD-10-CM | POA: Diagnosis not present

## 2018-08-08 DIAGNOSIS — Z91041 Radiographic dye allergy status: Secondary | ICD-10-CM | POA: Insufficient documentation

## 2018-08-08 DIAGNOSIS — Z8249 Family history of ischemic heart disease and other diseases of the circulatory system: Secondary | ICD-10-CM | POA: Diagnosis not present

## 2018-08-08 HISTORY — PX: AV FISTULA INSERTION W/ RF MAGNETIC GUIDANCE: CATH118308

## 2018-08-08 LAB — GLUCOSE, CAPILLARY
GLUCOSE-CAPILLARY: 77 mg/dL (ref 70–99)
Glucose-Capillary: 162 mg/dL — ABNORMAL HIGH (ref 70–99)
Glucose-Capillary: 181 mg/dL — ABNORMAL HIGH (ref 70–99)

## 2018-08-08 LAB — ABO/RH: ABO/RH(D): O POS

## 2018-08-08 SURGERY — AV FISTULA INSERTION W/RF MAGNETIC GUIDANCE
Anesthesia: General | Laterality: Left

## 2018-08-08 MED ORDER — VERAPAMIL HCL 2.5 MG/ML IV SOLN
INTRAVENOUS | Status: AC
  Filled 2018-08-08: qty 2

## 2018-08-08 MED ORDER — FAMOTIDINE 20 MG PO TABS
ORAL_TABLET | ORAL | Status: AC
  Filled 2018-08-08: qty 2

## 2018-08-08 MED ORDER — PROPOFOL 10 MG/ML IV BOLUS
INTRAVENOUS | Status: AC
  Filled 2018-08-08: qty 20

## 2018-08-08 MED ORDER — METHYLPREDNISOLONE SODIUM SUCC 125 MG IJ SOLR
INTRAMUSCULAR | Status: AC
  Filled 2018-08-08: qty 2

## 2018-08-08 MED ORDER — SODIUM CHLORIDE 0.9 % IV SOLN
INTRAVENOUS | Status: DC
  Administered 2018-08-08: 08:00:00 via INTRAVENOUS

## 2018-08-08 MED ORDER — DEXAMETHASONE SODIUM PHOSPHATE 10 MG/ML IJ SOLN
INTRAMUSCULAR | Status: AC
  Filled 2018-08-08: qty 1

## 2018-08-08 MED ORDER — FENTANYL CITRATE (PF) 100 MCG/2ML IJ SOLN
INTRAMUSCULAR | Status: DC | PRN
  Administered 2018-08-08: 50 ug via INTRAVENOUS
  Administered 2018-08-08: 100 ug via INTRAVENOUS

## 2018-08-08 MED ORDER — SUCCINYLCHOLINE CHLORIDE 20 MG/ML IJ SOLN
INTRAMUSCULAR | Status: DC | PRN
  Administered 2018-08-08: 80 mg via INTRAVENOUS

## 2018-08-08 MED ORDER — LIDOCAINE HCL (PF) 1 % IJ SOLN
INTRAMUSCULAR | Status: AC
  Filled 2018-08-08: qty 30

## 2018-08-08 MED ORDER — LIDOCAINE HCL (CARDIAC) PF 100 MG/5ML IV SOSY
PREFILLED_SYRINGE | INTRAVENOUS | Status: DC | PRN
  Administered 2018-08-08: 60 mg via INTRAVENOUS

## 2018-08-08 MED ORDER — PHENYLEPHRINE HCL 10 MG/ML IJ SOLN
INTRAMUSCULAR | Status: DC | PRN
  Administered 2018-08-08 (×7): 100 ug via INTRAVENOUS

## 2018-08-08 MED ORDER — HYDROCODONE-ACETAMINOPHEN 5-325 MG PO TABS: 1.0000 | ORAL_TABLET | Freq: Four times a day (QID) | ORAL | 0 refills | Status: DC | PRN

## 2018-08-08 MED ORDER — MIDAZOLAM HCL 2 MG/2ML IJ SOLN
INTRAMUSCULAR | Status: DC | PRN
  Administered 2018-08-08: 2 mg via INTRAVENOUS

## 2018-08-08 MED ORDER — OXYCODONE HCL 5 MG PO TABS: 5.0000 mg | ORAL_TABLET | Freq: Once | ORAL | Status: DC | PRN

## 2018-08-08 MED ORDER — MIDAZOLAM HCL 2 MG/2ML IJ SOLN
INTRAMUSCULAR | Status: AC
  Filled 2018-08-08: qty 2

## 2018-08-08 MED ORDER — NITROGLYCERIN 0.2 MG/ML ON CALL CATH LAB
INTRAVENOUS | Status: DC | PRN
  Administered 2018-08-08: 10 ug via INTRAVENOUS
  Administered 2018-08-08: 5 ug via INTRAVENOUS

## 2018-08-08 MED ORDER — FAMOTIDINE 20 MG PO TABS
40.0000 mg | ORAL_TABLET | Freq: Once | ORAL | Status: AC
  Administered 2018-08-08: 40 mg via ORAL

## 2018-08-08 MED ORDER — ONDANSETRON HCL 4 MG/2ML IJ SOLN
INTRAMUSCULAR | Status: AC
  Filled 2018-08-08: qty 2

## 2018-08-08 MED ORDER — GLYCOPYRROLATE 0.2 MG/ML IJ SOLN
INTRAMUSCULAR | Status: AC
  Filled 2018-08-08: qty 1

## 2018-08-08 MED ORDER — LIDOCAINE HCL (PF) 2 % IJ SOLN
INTRAMUSCULAR | Status: AC
  Filled 2018-08-08: qty 10

## 2018-08-08 MED ORDER — DEXAMETHASONE SODIUM PHOSPHATE 10 MG/ML IJ SOLN
INTRAMUSCULAR | Status: DC | PRN
  Administered 2018-08-08: 5 mg via INTRAVENOUS

## 2018-08-08 MED ORDER — FENTANYL CITRATE (PF) 100 MCG/2ML IJ SOLN
INTRAMUSCULAR | Status: AC
  Filled 2018-08-08: qty 2

## 2018-08-08 MED ORDER — HEPARIN SODIUM (PORCINE) 1000 UNIT/ML IJ SOLN
INTRAMUSCULAR | Status: DC | PRN
  Administered 2018-08-08: 300 [IU] via INTRAVENOUS
  Administered 2018-08-08: 150 [IU] via INTRAVENOUS
  Administered 2018-08-08: 2000 [IU] via INTRAVENOUS

## 2018-08-08 MED ORDER — IOPAMIDOL (ISOVUE-300) INJECTION 61%
INTRAVENOUS | Status: DC | PRN
  Administered 2018-08-08: 80 mL via INTRAVENOUS

## 2018-08-08 MED ORDER — HEPARIN (PORCINE) IN NACL 1000-0.9 UT/500ML-% IV SOLN
INTRAVENOUS | Status: AC
  Filled 2018-08-08: qty 1000

## 2018-08-08 MED ORDER — KETAMINE HCL 50 MG/ML IJ SOLN
INTRAMUSCULAR | Status: DC | PRN
  Administered 2018-08-08: 50 mg via INTRAMUSCULAR

## 2018-08-08 MED ORDER — VERAPAMIL HCL 2.5 MG/ML IV SOLN
INTRAVENOUS | Status: DC | PRN
  Administered 2018-08-08: .125 mg via INTRAVENOUS
  Administered 2018-08-08: .25 mg via INTRAVENOUS

## 2018-08-08 MED ORDER — FENTANYL CITRATE (PF) 100 MCG/2ML IJ SOLN: 25.0000 ug | INTRAMUSCULAR | Status: DC | PRN

## 2018-08-08 MED ORDER — OXYCODONE HCL 5 MG/5ML PO SOLN: 5.0000 mg | Freq: Once | ORAL | Status: DC | PRN

## 2018-08-08 MED ORDER — DIPHENHYDRAMINE HCL 50 MG/ML IJ SOLN
INTRAMUSCULAR | Status: AC
  Filled 2018-08-08: qty 1

## 2018-08-08 MED ORDER — HEPARIN SODIUM (PORCINE) 1000 UNIT/ML IJ SOLN
INTRAMUSCULAR | Status: AC
  Filled 2018-08-08: qty 1

## 2018-08-08 MED ORDER — ONDANSETRON HCL 4 MG/2ML IJ SOLN
INTRAMUSCULAR | Status: DC | PRN
  Administered 2018-08-08: 4 mg via INTRAVENOUS

## 2018-08-08 MED ORDER — GLYCOPYRROLATE 0.2 MG/ML IJ SOLN
INTRAMUSCULAR | Status: DC | PRN
  Administered 2018-08-08: 0.2 mg via INTRAVENOUS

## 2018-08-08 MED ORDER — SODIUM CHLORIDE 0.9 % IJ SOLN
INTRAMUSCULAR | Status: AC
  Filled 2018-08-08: qty 50

## 2018-08-08 MED ORDER — DIPHENHYDRAMINE HCL 50 MG/ML IJ SOLN
12.5000 mg | Freq: Once | INTRAMUSCULAR | Status: AC
  Administered 2018-08-08: 12.5 mg via INTRAVENOUS

## 2018-08-08 MED ORDER — PROPOFOL 10 MG/ML IV BOLUS
INTRAVENOUS | Status: DC | PRN
  Administered 2018-08-08: 150 mg via INTRAVENOUS

## 2018-08-08 SURGICAL SUPPLY — 21 items
CATH BEACON 5 .035 40 KMP TP (CATHETERS) ×1 IMPLANT
CATH BEACON 5 .038 40 KMP TP (CATHETERS) ×1
CATH MICROCATH PRGRT 2.8F 110 (CATHETERS) ×1 IMPLANT
CATH WAVELINQ 6FR (CATHETERS) ×2 IMPLANT
DEVICE OCCLUSION POD5 (Vascular Products) ×1 IMPLANT
DRAPE BRACHIAL (DRAPES) ×2 IMPLANT
GUIDEWIRE ADV .018X180CM (WIRE) ×2 IMPLANT
HANDLE DETACHMENT COIL (MISCELLANEOUS) ×2 IMPLANT
MICROCATH PROGREAT 2.8F 110 CM (CATHETERS) ×2
NEEDLE ENTRY 21GA 7CM ECHOTIP (NEEDLE) ×2 IMPLANT
OCCLUSION DEVICE POD5 (Vascular Products) ×2 IMPLANT
PACK ANGIOGRAPHY (CUSTOM PROCEDURE TRAY) ×2 IMPLANT
PENCIL ELECTRO HAND CTR (MISCELLANEOUS) ×2 IMPLANT
SET INTRO CAPELLA COAXIAL (SET/KITS/TRAYS/PACK) ×6 IMPLANT
SHEATH  7FR 11CM 018 (SHEATH) ×1
SHEATH 6FR 11CM 018 (SHEATH) ×2 IMPLANT
SHEATH 7FR 11CM 018 (SHEATH) ×1 IMPLANT
SHIELD RADPAD DADD DRAPE 4X9 (MISCELLANEOUS) ×2 IMPLANT
VALVE HEMO TOUHY BORST Y (ADAPTER) ×2 IMPLANT
WIRE G 018X200 V18 (WIRE) ×2 IMPLANT
WIRE G V-18X110 (WIRE) ×2 IMPLANT

## 2018-08-08 NOTE — Op Note (Signed)
[facility]  Percutaneous Study/Intervention Procedural Note   Date of Surgery: 08/08/2018,1:55 PM  Surgeon: Hortencia Pilar  Pre-operative Diagnosis: End-stage renal disease requiring hemodialysis  Post-operative diagnosis:  Same  Procedure(s) Performed:  1.  Placement 7 French venous sheath left brachial vein with ultrasound guidance  2.  Placement 6 French arterial sheath left brachial artery with ultrasound guidance  3.  Left arm arteriography second-order catheter placement  4.  Left arm venography  5.  Creation of an ulnar ulnar fistula using radiofrequency with the [facility] system  6.  Coil embolization of the left brachial vein    Anesthesia: General anesthesia  Sheath: 7 French sheath left brachial vein and 6 French sheath left brachial artery  Contrast: 60 cc   Fluoroscopy Time: 21.2 minutes  Indications: Patient presents with end-stage renal disease and will require hemodialysis.  Therefore appropriate upper extremity access is being created.  The patient has been preoperatively vein mapped and is found to be a good candidate for the [facility] radiofrequency system.  Risks and benefits of been reviewed all questions been answered patient has been shown a video of the [place].  The patient agrees to proceed with surgery   Procedure:  Tara Schneck Hairstonis a 72 y.o. female who was identified and appropriate procedural time out was performed.  The patient was then placed supine on the special procedures table with the left arm extended palm upward and secured to the armboard in the standard fashion.  The left arm was then prepped and draped in the usual sterile fashion.    Ultrasound was used to evaluate the left brachial vein.  The brachial vein was echolucent and compressible indicating it is patent .  An ultrasound image was acquired for the permanent record.  A micropuncture needle was used to access the left brachial vein under direct  ultrasound guidance.  The microwire was then advanced under fluoroscopic guidance without difficulty followed by the micro-sheath.    Ultrasound was then used to evaluate the left brachial artery.  The brachial artery was echolucent and pulsatile indicating patency.  An ultrasound image was acquired for the permanent record.  A micropuncture needle was then used to access the left brachial artery.  Microwire was then advanced followed by the micro sheath.  Hand-injection of contrast was then used to create the initial arteriogram.  A V 18 wire was then negotiated under direct visualization down into the more distal brachial artery and the micro-sheath was then upsized to a 6 [place] sheath.  V 18 wire and Kumpe catheter were then utilized to first select the common ulnar and subsequently the proper ulnar artery.  Hand-injection contrast was then used to demonstrate the distal ulnar artery.  This represents second-order catheter placement.  Once hand-injection and confirmed positioning within the ulnar artery the V 18 wire was reintroduced.    The venous sheath was then upsized to a 7 [place] sheath and using a combination of a Kumpe catheter and a V 18 wire the wire catheter combination was negotiated down into the ulnar vein.  This was accomplished utilizing multiple hand injections to slowly map your way down to this level.  This is required as the contrast will not reflux distally to a competent valve.  Once the 2 wires had been advanced side-by-side the detector was moved across the field with fluoroscopy to choose the view demonstrating the maximal separation between the wires.  Arteriography was now performed by hand in a magnified image centered  over the common ulnar artery, the WavelinQ arterial catheter was advanced down to the level of the common ulnar artery and positioned so that the ceramic plate was facing the venous wire.  The WavelinQ venous catheter was then advanced through the 7 French sheath  and positioned opposite to the arterial catheter.  The catheters were then visualized in a meticulous fashion to ensure there was no wire wrap that the electrode is facing the ceramic plate and that the magnets are lined up and separated by no greater than 2 mm.  This was accomplished in multiple different obliquities.  The electrode was then deployed.  Ultimately the final view was a complete orthogonal view and a coned in magnified image was used to verify the electrode was against the ceramic plate.  We then returned to the working view that was initially determined as noted above.  Both the arterial and venous wires were then pulled back proximal to the catheters.   Once the catheter position had been verified the RF pulse was delivered the electrode was visually noted to touch the ceramic plate.  The electrode was then recaptured and the catheter and wire removed from the venous sheath.  Catheter and wire were removed from the arterial sheath.  Hand-injection through the arterial sheath confirmed successful fistula creation.  The venous sheath was then readdressed hand-injection of contrast and a magnified view was used to demonstrate the brachial vein anatomy.  Subsequently, a single 4 x 25 Ruby coil was deployed successfully occluding the brachial vein and forcing maximal flow through the fistula  Final fistulogram was then performed confirming rapid flow of contrast through the arteriovenous fistula with filling of the superficial system.  Both sheaths were then removed and manual pressure was held for 20 minutes.   Disposition: Patient was taken to the recovery room in stable condition having tolerated the procedure well.  Tara Holmes Single 08/08/2018,1:55 PM

## 2018-08-08 NOTE — Anesthesia Procedure Notes (Signed)
Procedure Name: Intubation Date/Time: 08/08/2018 11:23 AM Performed by: Eben Burow, CRNA Pre-anesthesia Checklist: Patient identified, Emergency Drugs available, Suction available, Patient being monitored and Timeout performed Patient Re-evaluated:Patient Re-evaluated prior to induction Oxygen Delivery Method: Circle system utilized Preoxygenation: Pre-oxygenation with 100% oxygen Induction Type: IV induction Ventilation: Mask ventilation without difficulty Laryngoscope Size: Miller and 2 Grade View: Grade I Tube type: Oral Tube size: 7.0 mm Number of attempts: 1 Airway Equipment and Method: Stylet Placement Confirmation: ETT inserted through vocal cords under direct vision,  positive ETCO2 and breath sounds checked- equal and bilateral Secured at: 21 cm Tube secured with: Tape Dental Injury: Teeth and Oropharynx as per pre-operative assessment

## 2018-08-08 NOTE — Transfer of Care (Signed)
Immediate Anesthesia Transfer of Care Note  Patient: Tara Holmes  Procedure(s) Performed: AV FISTULA INSERTION W/RF MAGNETIC GUIDANCE (Left )  Patient Location: PACU  Anesthesia Type:General  Level of Consciousness: drowsy  Airway & Oxygen Therapy: Patient Spontanous Breathing and Patient connected to face mask oxygen  Post-op Assessment: Report given to RN and Post -op Vital signs reviewed and stable  Post vital signs: Reviewed and stable  Last Vitals:  Vitals Value Taken Time  BP 112/65 08/08/2018  1:59 PM  Temp 36.1 C 08/08/2018  1:59 PM  Pulse 72 08/08/2018  2:11 PM  Resp 11 08/08/2018  2:11 PM  SpO2 100 % 08/08/2018  2:11 PM  Vitals shown include unvalidated device data.  Last Pain:  Vitals:   08/08/18 1359  TempSrc:   PainSc: 0-No pain         Complications: No apparent anesthesia complications

## 2018-08-08 NOTE — Anesthesia Preprocedure Evaluation (Signed)
Anesthesia Evaluation  Patient identified by MRN, date of birth, ID band Patient awake    Reviewed: Allergy & Precautions, H&P , NPO status , Patient's Chart, lab work & pertinent test results  History of Anesthesia Complications Negative for: history of anesthetic complications  Airway Mallampati: III  TM Distance: <3 FB Neck ROM: limited    Dental  (+) Chipped, Poor Dentition   Pulmonary neg pulmonary ROS, neg shortness of breath, sleep apnea and Continuous Positive Airway Pressure Ventilation ,           Cardiovascular Exercise Tolerance: Good hypertension, (-) angina+ Peripheral Vascular Disease  (-) Past MI and (-) DOE      Neuro/Psych negative neurological ROS  negative psych ROS   GI/Hepatic Neg liver ROS, GERD  Medicated and Controlled,  Endo/Other  diabetes, Type 2  Renal/GU ESRFRenal disease     Musculoskeletal   Abdominal   Peds  Hematology negative hematology ROS (+)   Anesthesia Other Findings Past Medical History: No date: Anemia 1978: Aneurysm ([place]) No date: Chronic kidney disease     Comment:  ESRD No date: Diabetes ([place]) No date: GERD (gastroesophageal reflux disease) No date: Hypertension No date: Sleep apnea  Past Surgical History: No date: BREAST SURGERY; Left     Comment:  fibroid No date: TONSILLECTOMY  BMI    Body Mass Index:  31.44 kg/m      Reproductive/Obstetrics negative OB ROS                             Anesthesia Physical Anesthesia Plan  ASA: IV  Anesthesia Plan: General ETT   Post-op Pain Management:    Induction: Intravenous  PONV Risk Score and Plan: Ondansetron, Dexamethasone, Midazolam and Treatment may vary due to age or medical condition  Airway Management Planned: Oral ETT  Additional Equipment:   Intra-op Plan:   Post-operative Plan: Extubation in OR  Informed Consent: I have reviewed the patients History and Physical,  chart, labs and discussed the procedure including the risks, benefits and alternatives for the proposed anesthesia with the patient or authorized representative who has indicated his/her understanding and acceptance.   Dental Advisory Given  Plan Discussed with: Anesthesiologist, CRNA and Surgeon  Anesthesia Plan Comments: (Patient consented for risks of anesthesia including but not limited to:  - adverse reactions to medications - damage to teeth, lips or other oral mucosa - sore throat or hoarseness - Damage to heart, brain, lungs or loss of life  Patient voiced understanding.)        Anesthesia Quick Evaluation

## 2018-08-08 NOTE — Anesthesia Post-op Follow-up Note (Signed)
Anesthesia QCDR form completed.        

## 2018-08-08 NOTE — H&P (Signed)
[facility] Admission History & Physical  MRN : [id number]  Tara Holmes is a 72 y.o. (January 04, 1946) female who presents with chief complaint of No chief complaint on file. Marland Kitchen  History of Present Illness:  The patient is here today for creation of dialysis access.  There have not been previous accesses.  Her most recent GFR is now 16 she is a stage V.  Currently she is not on dialysis.    The patient denies amaurosis fugax or recent TIA symptoms. There are no recent neurological changes noted. The patient denies claudication symptoms or rest pain symptoms. The patient denies history of DVT, PE or superficial thrombophlebitis. The patient denies recent episodes of angina or shortness of breath.   Vein mapping shows she is a candidate for a wavelength.   Current Facility-Administered Medications  Medication Dose Route Frequency Provider Last Rate Last Dose  . methylPREDNISolone sodium succinate (SOLU-MEDROL) 125 mg/2 mL injection 125 mg  125 mg Intravenous Once Eulogio Ditch E, NP      . vancomycin (VANCOCIN) IVPB 1000 mg/200 mL premix  1,000 mg Intravenous On Call to [place], NP        Past Medical History:  Diagnosis Date  . Anemia   . Aneurysm ([place]) 1978  . Chronic kidney disease    ESRD  . Diabetes ([place])   . GERD (gastroesophageal reflux disease)   . Hypertension   . Sleep apnea     Past Surgical History:  Procedure Laterality Date  . BREAST SURGERY Left    fibroid  . TONSILLECTOMY      Social History Social History   Tobacco Use  . Smoking status: Never Smoker  . Smokeless tobacco: Never Used  Substance Use Topics  . Alcohol use: No  . Drug use: Never    Family History Family History  Problem Relation Age of Onset  . Heart attack Mother   . Diabetes Mother   . Colon cancer Father     No family history of bleeding or clotting disorders, autoimmune disease or porphyria  Allergies  Allergen Reactions  .  Amoxicillin Other (See Comments)    Pt states allergic reaction in vaginal region. Has patient had a PCN reaction causing immediate rash, facial/tongue/throat swelling, SOB or lightheadedness with hypotension: No Has patient had a PCN reaction causing severe rash involving mucus membranes or skin necrosis: No Has patient had a PCN reaction that required hospitalization: No Has patient had a PCN reaction occurring within the last 10 years: No Yeast infection reaction ONLY If all of the above answers are "NO", then may proceed with Ce  . Atorvastatin Other (See Comments)    MUSCLE ACHES   . Prednisone Other (See Comments)    ELEVATED BLOOD SUGAR  . Contrast Media [Iodinated Diagnostic Agents] Other (See Comments)    PATIENT IS UNAWARE OF THIS ALLERGY OR ANY REACTION TO THIS MEDICATION.     REVIEW OF SYSTEMS (Negative unless checked)  Constitutional: [] Weight loss  [] Fever  [] Chills Cardiac: [] Chest pain   [] Chest pressure   [] Palpitations   [] Shortness of breath when laying flat   [] Shortness of breath at rest   [x] Shortness of breath with exertion. Vascular:  [] Pain in legs with walking   [] Pain in legs at rest   [] Pain in legs when laying flat   [] Claudication   [] Pain in feet when walking  [] Pain in feet at rest  [] Pain in feet when laying flat   [] History  of DVT   [] Phlebitis   [] Swelling in legs   [] Varicose veins   [] Non-healing ulcers Pulmonary:   [] Uses home oxygen   [] Productive cough   [] Hemoptysis   [] Wheeze  [] COPD   [] Asthma Neurologic:  [] Dizziness  [] Blackouts   [] Seizures   [] History of stroke   [] History of TIA  [] Aphasia   [] Temporary blindness   [] Dysphagia   [] Weakness or numbness in arms   [] Weakness or numbness in legs Musculoskeletal:  [] Arthritis   [] Joint swelling   [] Joint pain   [] Low back pain Hematologic:  [] Easy bruising  [] Easy bleeding   [] Hypercoagulable state   [] Anemic  [] Hepatitis Gastrointestinal:  [] Blood in stool   [] Vomiting blood  [] Gastroesophageal  reflux/heartburn   [] Difficulty swallowing. Genitourinary:  [x] Chronic kidney disease   [] Difficult urination  [] Frequent urination  [] Burning with urination   [] Blood in urine Skin:  [] Rashes   [] Ulcers   [] Wounds Psychological:  [] History of anxiety   []  History of major depression.  Physical Examination  Vitals:   08/08/18 0757  BP: 134/64  Pulse: 62  Resp: 16  Temp: 97.7 F (36.5 C)  TempSrc: Oral  SpO2: 98%  Weight: 80.5 kg  Height: 5\' 3"  (1.6 m)   Body mass index is 31.44 kg/m. Gen: WD/WN, NAD Head: [place]/AT, No temporalis wasting. Prominent temp pulse not noted. Ear/Nose/Throat: Hearing grossly intact, nares w/o erythema or drainage, oropharynx w/o Erythema/Exudate,  Eyes: Conjunctiva clear, sclera non-icteric Neck: Trachea midline.  No JVD.  Pulmonary:  Good air movement, respirations not labored, no use of accessory muscles.  Cardiac: RRR, normal S1, S2. Vascular:  Vessel Right Left  Radial Palpable Palpable  Ulnar Not Palpable Not Palpable  Brachial Palpable Palpable  Carotid Palpable, without bruit Palpable, without bruit  Gastrointestinal: soft, non-tender/non-distended. No guarding/reflex.  Musculoskeletal: M/S 5/5 throughout.  Extremities without ischemic changes.  No deformity or atrophy.  Neurologic: Sensation grossly intact in extremities.  Symmetrical.  Speech is fluent. Motor exam as listed above. Psychiatric: Judgment intact, Mood & affect appropriate for pt's clinical situation. Dermatologic: No rashes or ulcers noted.  No cellulitis or open wounds. Lymph : No Cervical, Axillary, or Inguinal lymphadenopathy.   CBC Lab Results  Component Value Date   WBC 6.6 08/01/2018   HGB 10.3 (L) 08/01/2018   HCT 30.1 (L) 08/01/2018   MCV 84.2 08/01/2018   PLT 215 08/01/2018    BMET    Component Value Date/Time   NA 142 08/01/2018 1121   K 4.1 08/01/2018 1121   CL 106 08/01/2018 1121   CO2 29 08/01/2018 1121   GLUCOSE 137 (H) 08/01/2018 1121   BUN 54  (H) 08/01/2018 1121   CREATININE 2.84 (H) 08/01/2018 1121   CALCIUM 9.6 08/01/2018 1121   GFRNONAA 16 (L) 08/01/2018 1121   GFRAA 18 (L) 08/01/2018 1121   Estimated Creatinine Clearance: 18.8 mL/min (A) (by C-G formula based on SCr of 2.84 mg/dL (H)).  COAG Lab Results  Component Value Date   INR 1.06 08/01/2018    Radiology No results found.  Assessment/Plan 1.  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  She has been vein mapped and is found to have adequate anatomy for a fistula creation specifically using the wave link device.  The risks and benefits as well as alternative therapies of all been reviewed.  She agrees to proceed with fistula creation. 2.  Hyperlipidemia: Continue statin as ordered  and reviewed, no changes at this time 3.  Hypertension:  Patient will continue medical management; nephrology is following no changes in oral medications. 4. Diabetes mellitus:  Glucose will be monitored and oral medications been held this morning once the patient has undergone the patient's procedure po intake will be reinitiated and again Accu-Cheks will be used to assess the blood glucose level and treat as needed. The patient will be restarted on the patient's usual hypoglycemic regime     Hortencia Pilar, MD  08/08/2018 8:05 AM

## 2018-08-09 ENCOUNTER — Telehealth (INDEPENDENT_AMBULATORY_CARE_PROVIDER_SITE_OTHER): Payer: Self-pay

## 2018-08-09 ENCOUNTER — Encounter: Payer: Self-pay | Admitting: Vascular Surgery

## 2018-08-09 NOTE — Telephone Encounter (Signed)
Patient called to ask question as to what her restrictions are since she had surgery on yesterday. She wanted to know if it was okay to shower, and if the site needed to be recovered while showering and afterwards?  I read her instructions to her from the discharge summary.

## 2018-08-16 ENCOUNTER — Encounter: Payer: Self-pay | Admitting: Cardiovascular Disease

## 2018-08-16 ENCOUNTER — Ambulatory Visit (INDEPENDENT_AMBULATORY_CARE_PROVIDER_SITE_OTHER): Payer: Medicare Other | Admitting: Cardiovascular Disease

## 2018-08-16 VITALS — BP 112/52 | HR 81 | Ht 63.0 in | Wt 180.8 lb

## 2018-08-16 DIAGNOSIS — I10 Essential (primary) hypertension: Secondary | ICD-10-CM

## 2018-08-16 DIAGNOSIS — G4733 Obstructive sleep apnea (adult) (pediatric): Secondary | ICD-10-CM

## 2018-08-16 DIAGNOSIS — N186 End stage renal disease: Secondary | ICD-10-CM

## 2018-08-16 DIAGNOSIS — E1121 Type 2 diabetes mellitus with diabetic nephropathy: Secondary | ICD-10-CM

## 2018-08-16 DIAGNOSIS — I359 Nonrheumatic aortic valve disorder, unspecified: Secondary | ICD-10-CM | POA: Diagnosis not present

## 2018-08-16 DIAGNOSIS — Z9989 Dependence on other enabling machines and devices: Secondary | ICD-10-CM

## 2018-08-16 DIAGNOSIS — E785 Hyperlipidemia, unspecified: Secondary | ICD-10-CM

## 2018-08-16 NOTE — Patient Instructions (Signed)
Medication Instructions:  Your physician recommends that you continue on your current medications as directed. Please refer to the Current Medication list given to you today.  Follow-Up: Your physician wants you to follow-up in: 1 year with Dr. Claiborne Billings.  You will receive a reminder letter in the mail two months in advance. If you don't receive a letter, please call our office to schedule the follow-up appointment.   Any Other Special Instructions Will Be Listed Below (If Applicable).     If you need a refill on your cardiac medications before your next appointment, please call your pharmacy.

## 2018-08-18 ENCOUNTER — Encounter: Payer: Self-pay | Admitting: Cardiovascular Disease

## 2018-08-18 NOTE — Progress Notes (Signed)
Patient ID: Tara Holmes, female   DOB: 11-05-1946, 72 y.o.   MRN: [id number]     Primary M.D.: Dr. Quillian Quince Pomponsini  HPI: Tara Holmes is a 72 y.o. female who presents to the office today for a 13 month follow-up cardiology/sleep evaluation.  [name] has a long history of hypertension, family history for diabetes mellitus, hyperlipidemia, obesity, and is status post clipping of remote left brain aneurysm. She has a history of obstructive sleep apnea which was originally diagnosed over 10 years ago. This study had been done in [place].  She was able to obtain a new CPAP machine and now has a ResMed air since 10.  [place] to using CPAP with 100% compliance.  Typically she goes to bed between 9 and 9:30 PM.  She believes her sleep is restorative.  She occasionally takes a daytime nap.  She has a history of hyperlipidemia for which she's been on Mevacor 40 mg, and fish oil capsules.  She is no longer taking WelChol.  Her blood pressure has been treated with losartan 100 mg, amlodipine 10 mg, carvedilol 25 mg twice a day and furosemide 40 mg. She is diabetic and now is on glimepiride and Tradjenta.  She was  evaluated at [facility] emergency room for chest pain on 04/02/2017.  Her chest pain was nonexertional and not described as a tightness.  It was felt to be somewhat atypical.  She saw her primary physician in follow-up the following day.  Her pain was described as sharp, which occurred in different locations on her chest nor not associated with radiation to her arms or jaw.  It would last 5 minutes and then dissipate spontaneously.    Since I saw her, she also has been evaluated at [facility] for chronic kidney disease stage IV.  There has been discussion for possible AV fistula formation in the very near future.  At her last evaluation.  Her serum creatinine was 3.6.  She had seen Dr. Lissa Merlin and recently saw Dr. Adonis Housekeeper.   I  saw her in April 2018 in follow-up of her  ER evaluation.  I scheduled her for [facility] study which was done on 04/19/2017.  This revealed normal perfusion without scar or ischemia.  Ejection fraction was 55%.  I reviewed her most recent echo which showed LVH with normal systolic function with grade 2 diastolic dysfunction, aortic valve sclerosis without stenosis, and mitral annular calcification with trivial PR.  She has continued to use her CPAP.  She admits to 100% compliance.  I saw her in July 2018.  She has developed progressive renal last week underwent an AV fistula in her left arm bands by Dr. Evelina Bucy.  Has been on amlodipine 5 mg, low 5 mg twice a day, furosemide 40 mg daily but pressure control.  She is diabetic he is on gloimeperide  but is no longer taking Tradjenta.  He used to use CPAP and her DME company is [employer] in [place].  Load was obtained in the office today from April 18 through May 04, 2018 and she continues to meet compliance only sleeping 6 hours and 23 minutes with CPAP on a daily basis.  At a auto set up her 95th percentile pressure is 16.8 with a maximum average pressure of 18.3.  She is using a fullface mask.  AHI is excellent at 1.3.  Except for 2 days there was no leak.  Eyes any chest pain.  She denies PND orthopnea.  She presents for yearly  evaluation.  Past Medical History:  Diagnosis Date  . Anemia   . Aneurysm ([place]) 1978  . Chronic kidney disease    ESRD  . Diabetes ([place])   . GERD (gastroesophageal reflux disease)   . Hypertension   . Sleep apnea     Past Surgical History:  Procedure Laterality Date  . AV FISTULA INSERTION W/ RF MAGNETIC GUIDANCE Left 08/08/2018   Procedure: AV FISTULA INSERTION W/RF MAGNETIC GUIDANCE;  Surgeon: Katha Cabal, MD;  Location: [place] [facility];  Service: Cardiovascular;  Laterality: Left;  . BREAST SURGERY Left    fibroid  . TONSILLECTOMY      Allergies  Allergen Reactions  . Amoxicillin Other (See Comments)    Pt states allergic reaction in  vaginal region. Has patient had a PCN reaction causing immediate rash, facial/tongue/throat swelling, SOB or lightheadedness with hypotension: No Has patient had a PCN reaction causing severe rash involving mucus membranes or skin necrosis: No Has patient had a PCN reaction that required hospitalization: No Has patient had a PCN reaction occurring within the last 10 years: No Yeast infection reaction ONLY If all of the above answers are "NO", then may proceed with Ce  . Atorvastatin Other (See Comments)    MUSCLE ACHES   . Prednisone Other (See Comments)    ELEVATED BLOOD SUGAR  . Contrast Media [Iodinated Diagnostic Agents] Other (See Comments)    PATIENT IS UNAWARE OF THIS ALLERGY OR ANY REACTION TO THIS MEDICATION.    Current Outpatient Medications  Medication Sig Dispense Refill  . ACCU-CHEK COMPACT PLUS test strip CHECK BLOOD SUGAR ONCE A DAY  3  . acetaminophen (TYLENOL) 650 MG CR tablet Take 650 mg by mouth every 8 (eight) hours as needed for pain.    Marland Kitchen acyclovir (ZOVIRAX) 200 MG capsule Take 200 mg by mouth daily as needed (for cold sores/fever blisters.).     Marland Kitchen amLODipine (NORVASC) 10 MG tablet Take 5 mg by mouth daily.    . carvedilol (COREG) 25 MG tablet Take 25 mg by mouth 2 (two) times daily.     Marland Kitchen ezetimibe (ZETIA) 10 MG tablet Take 10 mg by mouth daily.    . ferrous sulfate 325 (65 FE) MG EC tablet Take 325 mg by mouth every evening.   0  . furosemide (LASIX) 40 MG tablet Take 40 mg by mouth daily.     Marland Kitchen glimepiride (AMARYL) 2 MG tablet Take 1 mg by mouth daily as needed (elevated blood sugar).     Marland Kitchen HYDROcodone-acetaminophen (NORCO) 5-325 MG tablet Take 1-2 tablets by mouth every 6 (six) hours as needed for moderate pain or severe pain. 50 tablet 0  . indomethacin (INDOCIN) 25 MG capsule Take 25 mg by mouth daily as needed. Gout  0  . Investigational - Study Medication Take 300 mg by mouth daily. Study drug: Vadadustat TAKE 2 TABLETS BY MOUTH EVERY MORNING Additional  study details: Obtains at [facility]. 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    Marland Kitchen linagliptin (TRADJENTA) 5 MG TABS tablet Take 5 mg by mouth daily as needed (for elevated blood sugar).     Marland Kitchen losartan (COZAAR) 100 MG tablet Take 100 mg by mouth daily.    Marland Kitchen omega-3 acid ethyl esters (LOVAZA) 1 g capsule Take 1 g by mouth daily.    Marland Kitchen oxybutynin (DITROPAN) 5 MG tablet Take 5 mg by mouth 2 (two) times daily.     . predniSONE (DELTASONE) 10 MG tablet Take 30-40  mg by mouth daily as needed. For gout  0  . ranitidine (ZANTAC) 150 MG capsule Take 150 mg by mouth daily as needed (for drainage).     . rosuvastatin (CRESTOR) 10 MG tablet Take 10 mg by mouth every evening.   3  . traMADol (ULTRAM) 50 MG tablet Take by mouth every 4 (four) hours as needed.     No current facility-administered medications for this visit.     Social History   Socioeconomic History  . Marital status: Married    Spouse name: Percell Miller  . Number of children: 2  . Years of education: MA  . Highest education level: Not on file  Occupational History  . Occupation: Retired  Scientific laboratory technician  . Financial resource strain: Not on file  . Food insecurity:    Worry: Not on file    Inability: Not on file  . Transportation needs:    Medical: Not on file    Non-medical: Not on file  Tobacco Use  . Smoking status: Never Smoker  . Smokeless tobacco: Never Used  Substance and Sexual Activity  . Alcohol use: No  . Drug use: Never  . Sexual activity: Not on file  Lifestyle  . Physical activity:    Days per week: Not on file    Minutes per session: Not on file  . Stress: Not on file  Relationships  . Social connections:    Talks on phone: Not on file    Gets together: Not on file    Attends religious service: Not on file    Active member of club or organization: Not on file    Attends meetings of clubs or organizations: Not on file    Relationship status: Not on file  . Intimate partner violence:    Fear of current or ex  partner: Not on file    Emotionally abused: Not on file    Physically abused: Not on file    Forced sexual activity: Not on file  Other Topics Concern  . Not on file  Social History Narrative   Patient lives at home with spouse.   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  There is no tobacco, alcohol.  Family History  Problem Relation Age of Onset  . Heart attack Mother   . Diabetes Mother   . Colon cancer Father     ROS General: Negative; No fevers, chills, or night sweats;  HEENT: Negative; No changes in vision or hearing, sinus congestion, difficulty swallowing Pulmonary: Negative; No cough, wheezing, shortness of breath, hemoptysis Cardiovascular: Negative; No chest pain, presyncope, syncope, palpitations Post left arm AV fistula GI: Negative; No nausea, vomiting, diarrhea, or abdominal pain GU: Negative; No dysuria, hematuria, or difficulty voiding Musculoskeletal: Negative; no myalgias, joint pain, or weakness Hematologic/Oncology: Negative; no easy bruising, bleeding Endocrine: Negative; no heat/cold intolerance; no diabetes Neuro: Negative; no changes in balance, headaches Skin: Negative; No rashes or skin lesions Psychiatric: Negative; No behavioral problems, depression Sleep: Positive for obstructive sleep apnea on CPAP therapy and she admits to 100% compliance.  No breakthrough snoring, daytime sleepiness, hypersomnolence, bruxism, restless legs, hypnogognic hallucinations, no cataplexy Other comprehensive 14 point system review is negative.   PE BP (!) 112/52   Pulse 81   Ht 5\' 3"  (1.6 m)   Wt 180 lb 12.8 oz (82 kg)   SpO2 99%   BMI 32.03 kg/m    BP by me was  22/68  Wt Readings from Last 3 Encounters:  08/16/18 180 lb 12.8 oz (82 kg)  08/08/18 177 lb 8 oz (80.5 kg)  08/01/18 178 lb 3.2 oz (80.8 kg)      Physical Exam BP (!) 112/52   Pulse 81   Ht 5\' 3"  (1.6 m)   Wt 180 lb 12.8 oz  (82 kg)   SpO2 99%   BMI 32.03 kg/m  General: Alert, oriented, no distress.  Skin: normal turgor, no rashes, warm and dry HEENT: Normocephalic, atraumatic. Pupils equal round and reactive to light; sclera anicteric; extraocular muscles intact; Fundi ** Nose without nasal septal hypertrophy Mouth/Parynx benign; Mallinpatti scale Neck: No JVD, no carotid bruits; normal carotid upstroke Lungs: clear to ausculatation and percussion; no wheezing or rales Chest wall: without tenderness to palpitation Heart: PMI not displaced, RRR, s1 s2 normal, 2/6 harsh systolic murmur, no diastolic murmur, no rubs, gallops, thrills, or heaves Abdomen: soft, nontender; no hepatosplenomehaly, BS+; abdominal aorta nontender and not dilated by palpation. Back: no CVA tenderness Pulses 2+ Musculoskeletal: full range of motion, normal strength, no joint deformities Extremities: Left Arm AV fistula; no clubbing cyanosis or edema, Homan's sign negative  Neurologic: grossly nonfocal; Cranial nerves grossly wnl Psychologic: Normal mood and affect  No ECG done today but ECG of 08/01/2018 was reviewed independently by me and shows sinus bradycardia 55 bpm.  There is mild T wave abnormality in leads I and aVL.  July 2018 ECG (independently read by me): Normal sinus rhythm at 61 bpm.  T-wave abnormality in leads 1 and aVL  April 2018 ECG (independently read by me): Normal sinus rhythm at 67 bpm.  Resolution of prior inferolateral T wave inversion, however, there is new T-wave inversion in leads 1 and aVL.  May 2017 ECG (independently read by me): Normal sinus rhythm at 66 bpm.  Inferolateral T wave abnormality.  April 2015 ECG (independently read by me): Normal sinus rhythm 81 beats per minute.  Nonspecific ST changes in leads 1, AVL and V6.   12/31/2013 ECG (independently read by me): Sinus rhythm. Nondiagnostic T changes.  LABS: BMP Latest Ref Rng & Units 08/01/2018  Glucose 70 - 99 mg/dL 137(H)  BUN 8 - 23 mg/dL  54(H)  Creatinine 0.44 - 1.00 mg/dL 2.84(H)  Sodium 135 - 145 mmol/L 142  Potassium 3.5 - 5.1 mmol/L 4.1  Chloride 98 - 111 mmol/L 106  CO2 22 - 32 mmol/L 29  Calcium 8.9 - 10.3 mg/dL 9.6     BMET    Component Value Date/Time   NA 142 08/01/2018 1121     Hepatic Function Panel  No results found for: PROT   CBC    Component Value Date/Time   WBC 6.6 08/01/2018 1121     BNP No results found for: PROBNP  Lipid Panel  No results found for: CHOL   RADIOLOGY: No results found.  IMPRESSION:  1. Essential hypertension   2. OSA on CPAP   3. Aortic valvular disorder   4. Hyperlipidemia with target LDL less than 70   5. Type 2 diabetes mellitus with diabetic nephropathy, without long-term current use of insulin ([place])   6. ESRD (end stage renal disease) ([place])     ASSESSMENT AND PLAN: [name] is a 72 year old African-American female who has a history of diabetes mellitus, hyperlipidemia, hypertension, obstructive sleep apnea has developed progressive renal insufficiency to left arm AV fistula.  Dr. today is stable at 122/68 when on repeat by me on her medical  regimen consisting of amlodipine 5 mg, carvedilol 25 mg twice a day, and furosemide 40 mg daily.  She of hyperlipidemia and is on rosuvastatin milligrams with target LDL less than 70 in this diabetic female.  Back murmur is stable and consistent with her documented aortic sclerosis rather than stenosis.  She has not had any recent symptoms of chest pain denies any presyncope or syncope.  Remotely a nuclear perfusion study had shown normal perfusion without scar or ischemia.  She continues to use CPAP therapy.  Her most recent download.  She is meeting compliance standards.  However, I will contact [facility] in [place] to see why we were unable to access to be on May 04, 2018.  She continues to use CPAP with 100% compliance her AHI was excellent at 1.3 although she required high pressures with a 95th percentile pressure at  16.8 and a maximum average pressure of 18.3.  An Epworth sleepiness scale score was calculated today and this endorsed at arguing against residual daytime sleepiness.  She is being followed closely by her nephrologist and recently had her fistula placed [place] by Dr. Evelina Bucy.  I will see her in 1 year for reevaluation.  Troy Sine, MD, [facility]  08/18/2018 12:38 PM

## 2018-08-22 ENCOUNTER — Encounter (INDEPENDENT_AMBULATORY_CARE_PROVIDER_SITE_OTHER): Payer: Self-pay | Admitting: Nurse Practitioner

## 2018-08-22 ENCOUNTER — Ambulatory Visit (INDEPENDENT_AMBULATORY_CARE_PROVIDER_SITE_OTHER): Payer: Medicare Other

## 2018-08-22 ENCOUNTER — Ambulatory Visit (INDEPENDENT_AMBULATORY_CARE_PROVIDER_SITE_OTHER): Payer: Medicare Other | Admitting: Nurse Practitioner

## 2018-08-22 ENCOUNTER — Other Ambulatory Visit (INDEPENDENT_AMBULATORY_CARE_PROVIDER_SITE_OTHER): Payer: Self-pay | Admitting: Vascular Surgery

## 2018-08-22 VITALS — BP 112/62 | HR 69 | Resp 13 | Ht 64.0 in | Wt 180.0 lb

## 2018-08-22 DIAGNOSIS — E1121 Type 2 diabetes mellitus with diabetic nephropathy: Secondary | ICD-10-CM | POA: Diagnosis not present

## 2018-08-22 DIAGNOSIS — N186 End stage renal disease: Secondary | ICD-10-CM

## 2018-08-22 DIAGNOSIS — N185 Chronic kidney disease, stage 5: Secondary | ICD-10-CM

## 2018-08-22 DIAGNOSIS — I10 Essential (primary) hypertension: Secondary | ICD-10-CM | POA: Diagnosis not present

## 2018-08-22 DIAGNOSIS — E785 Hyperlipidemia, unspecified: Secondary | ICD-10-CM | POA: Diagnosis not present

## 2018-08-24 ENCOUNTER — Encounter (INDEPENDENT_AMBULATORY_CARE_PROVIDER_SITE_OTHER): Payer: Self-pay | Admitting: Nurse Practitioner

## 2018-08-24 NOTE — Progress Notes (Signed)
Subjective:    Patient ID: Tara Holmes, female    DOB: Aug 30, 1946, 72 y.o.   MRN: [id number] Chief Complaint  Patient presents with  . Follow-up    2 week HDA f/u    HPI  Tara Holmes is a 72 y.o. female that presents following left ulnar ulnar AV fistula creation utilizing radiofrequency with the wavelinQ system on 08/08/2018.  The patient reports having no pain or soreness.  She has some slight bruising.  She denies any swelling of her left upper extremity.  She denies any numbness or pain in her hand or fingers.  She denies any fever, chills, nausea, vomiting.  She denies any chest pain or shortness of breath.  She denies any amaurosis fugax or TIA-like symptoms.  Patient underwent a duplex of her dialysis access which revealed a patent left ulnar ulnar AV fistula with no significant focal velocity increases or internal vessel narrowing noted.  Flow volumes were noted at 830 mL/min.  There is a partial obstruction of a left distal upper arm level brachial vein which could possibly be due to thrombus versus wall trauma from sheath insertion.  Constitutional: [] Weight loss  [] Fever  [] Chills Cardiac: [] Chest pain   [] Chest pressure   [] Palpitations   [] Shortness of breath when laying flat   [] Shortness of breath with exertion. Vascular:  [] Pain in legs with walking   [] Pain in legs with standing  [] History of DVT   [] Phlebitis   [] Swelling in legs   [] Varicose veins   [] Non-healing ulcers Pulmonary:   [] Uses home oxygen   [] Productive cough   [] Hemoptysis   [] Wheeze  [] COPD   [] Asthma Neurologic:  [] Dizziness   [] Seizures   [] History of stroke   [] History of TIA  [] Aphasia   [] Vissual changes   [] Weakness or numbness in arm   [] Weakness or numbness in leg Musculoskeletal:   [] Joint swelling   [] Joint pain   [] Low back pain Hematologic:  [] Easy bruising  [] Easy bleeding   [] Hypercoagulable state   [] Anemic Gastrointestinal:  [] Diarrhea   [] Vomiting   [] Gastroesophageal reflux/heartburn   [] Difficulty swallowing. Genitourinary:  [x] Chronic kidney disease   [] Difficult urination  [] Frequent urination   [] Blood in urine Skin:  [] Rashes   [] Ulcers  Psychological:  [] History of anxiety   []  History of major depression.     Objective:   Physical Exam  BP 112/62 (BP Location: Right Arm, Patient Position: Sitting)   Pulse 69   Resp 13   Ht 5\' 4"  (1.626 m)   Wt 180 lb (81.6 kg)   BMI 30.90 kg/m   Past Medical History:  Diagnosis Date  . Anemia   . Aneurysm ([place]) 1978  . Chronic kidney disease    ESRD  . Diabetes ([place])   . GERD (gastroesophageal reflux disease)   . Hypertension   . Sleep apnea      Gen: WD/WN, NAD Head: [place]/AT, No temporalis wasting.  Ear/Nose/Throat: Hearing grossly intact, nares w/o erythema or drainage Eyes: PER, EOMI, sclera nonicteric.  Neck: Supple, no masses.  No JVD.  Pulmonary:  Good air movement, no use of accessory muscles.  Cardiac: RRR Vascular:  Good thrill felt and bruit heard Vessel Right Left  Radial  palpable  palpable  Gastrointestinal: soft, non-distended. No guarding/no peritoneal signs.  Musculoskeletal: M/S 5/5 throughout.  No deformity or atrophy.  Neurologic: Pain and light touch intact in extremities.  Symmetrical.  Speech is fluent. Motor exam as listed above. Psychiatric: Judgment intact, Mood &  affect appropriate for pt's clinical situation. Dermatologic: No Venous rashes. No Ulcers Noted.  No changes consistent with cellulitis.  Small amounts of bruising on the left upper extremity.  No significant swelling. Lymph : No Cervical lymphadenopathy, no lichenification or skin changes of chronic lymphedema.   Social History   Socioeconomic History  . Marital status: Married    Spouse name: Percell Miller  . Number of children: 2  . Years of education: MA  . Highest education level: Not on file  Occupational History  . Occupation: Retired  Scientific laboratory technician  . Financial resource strain:  Not on file  . Food insecurity:    Worry: Not on file    Inability: Not on file  . Transportation needs:    Medical: Not on file    Non-medical: Not on file  Tobacco Use  . Smoking status: Never Smoker  . Smokeless tobacco: Never Used  Substance and Sexual Activity  . Alcohol use: No  . Drug use: Never  . Sexual activity: Not on file  Lifestyle  . Physical activity:    Days per week: Not on file    Minutes per session: Not on file  . Stress: Not on file  Relationships  . Social connections:    Talks on phone: Not on file    Gets together: Not on file    Attends religious service: Not on file    Active member of club or organization: Not on file    Attends meetings of clubs or organizations: Not on file    Relationship status: Not on file  . Intimate partner violence:    Fear of current or ex partner: Not on file    Emotionally abused: Not on file    Physically abused: Not on file    Forced sexual activity: Not on file  Other Topics Concern  . Not on file  Social History Narrative   Patient lives at home with spouse.   Caffeine Use: none    Past Surgical History:  Procedure Laterality Date  . AV FISTULA INSERTION W/ RF MAGNETIC GUIDANCE Left 08/08/2018   Procedure: AV FISTULA INSERTION W/RF MAGNETIC GUIDANCE;  Surgeon: Katha Cabal, MD;  Location: [facility];  Service: Cardiovascular;  Laterality: Left;  . BREAST SURGERY Left    fibroid  . TONSILLECTOMY      Family History  Problem Relation Age of Onset  . Heart attack Mother   . Diabetes Mother   . Colon cancer Father     Allergies  Allergen Reactions  . Amoxicillin Other (See Comments)    Pt states allergic reaction in vaginal region. Has patient had a PCN reaction causing immediate rash, facial/tongue/throat swelling, SOB or lightheadedness with hypotension: No Has patient had a PCN reaction causing severe rash involving mucus membranes or skin necrosis: No Has patient had a PCN reaction  that required hospitalization: No Has patient had a PCN reaction occurring within the last 10 years: No Yeast infection reaction ONLY If all of the above answers are "NO", then may proceed with Ce  . Atorvastatin Other (See Comments)    MUSCLE ACHES   . Prednisone Other (See Comments)    ELEVATED BLOOD SUGAR  . Contrast Media [Iodinated Diagnostic Agents] Other (See Comments)    PATIENT IS UNAWARE OF THIS ALLERGY OR ANY REACTION TO THIS MEDICATION.       Assessment & Plan:   1. Chronic kidney disease, stage V [facility]) Patient underwent a duplex of her dialysis  access which revealed a patent left ulnar ulnar AV fistula with no significant focal velocity increases or internal vessel narrowing noted.  Flow volumes were noted at 830 mL/min.  There is a partial obstruction of a left distal upper arm level brachial vein which could possibly be due to thrombus versus wall trauma from sheath insertion.  Currently the patient is not beginning dialysis.  The creation of the new AV fistula appears to be developing well.  We will have the patient return in 2 months to evaluate the left distal upper arm level brachial vein to determine if the thrombus versus wall trauma has resolved.  We will also assess whether it is adequate to begin dialysis should that need arise.  - VAS US DUPLEX DIALYSIS ACCESS (AVF, AVG); Future  2. Hyperlipidemia with target LDL less than 70 Continue statin as ordered and reviewed, no changes at this time   3. Essential hypertension Continue antihypertensive medications as already ordered, these medications have been reviewed and there are no changes at this time.   4. Type 2 diabetes mellitus with diabetic nephropathy, without long-term current use of insulin (HCC) Continue hypoglycemic medications as already ordered, these medications have been reviewed and there are no changes at this time.  Hgb A1C to be monitored as already arranged by primary service    Current  Outpatient Medications on File Prior to Visit  Medication Sig Dispense Refill  . ACCU-CHEK COMPACT PLUS test strip CHECK BLOOD SUGAR ONCE A DAY  3  . acetaminophen (TYLENOL) 650 MG CR tablet Take 650 mg by mouth every 8 (eight) hours as needed for pain.    Marland Kitchen acyclovir (ZOVIRAX) 200 MG capsule Take 200 mg by mouth daily as needed (for cold sores/fever blisters.).     Marland Kitchen amLODipine (NORVASC) 10 MG tablet Take 5 mg by mouth daily.    . carvedilol (COREG) 25 MG tablet Take 25 mg by mouth 2 (two) times daily.     Marland Kitchen ezetimibe (ZETIA) 10 MG tablet Take 10 mg by mouth daily.    . ferrous sulfate 325 (65 FE) MG EC tablet Take 325 mg by mouth every evening.   0  . furosemide (LASIX) 40 MG tablet Take 40 mg by mouth daily.     Marland Kitchen glimepiride (AMARYL) 2 MG tablet Take 1 mg by mouth daily as needed (elevated blood sugar).     Marland Kitchen HYDROcodone-acetaminophen (NORCO) 5-325 MG tablet Take 1-2 tablets by mouth every 6 (six) hours as needed for moderate pain or severe pain. 50 tablet 0  . indomethacin (INDOCIN) 25 MG capsule Take 25 mg by mouth daily as needed. Gout  0  . Investigational - Study Medication Take 300 mg by mouth daily. Study drug: Vadadustat TAKE 2 TABLETS BY MOUTH EVERY MORNING Additional study details: Obtains at [facility] 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    Marland Kitchen linagliptin (TRADJENTA) 5 MG TABS tablet Take 5 mg by mouth daily as needed (for elevated blood sugar).     Marland Kitchen losartan (COZAAR) 100 MG tablet Take 100 mg by mouth daily.    Marland Kitchen omega-3 acid ethyl esters (LOVAZA) 1 g capsule Take 1 g by mouth daily.    Marland Kitchen oxybutynin (DITROPAN) 5 MG tablet Take 5 mg by mouth 2 (two) times daily.     . predniSONE (DELTASONE) 10 MG tablet Take 30-40 mg by mouth daily as needed. For gout  0  . ranitidine (ZANTAC) 150 MG capsule Take 150 mg by mouth daily as  needed (for drainage).     . rosuvastatin (CRESTOR) 10 MG tablet Take 10 mg by mouth every evening.   3  . traMADol (ULTRAM) 50 MG tablet Take by mouth  every 4 (four) hours as needed.     No current facility-administered medications on file prior to visit.     There are no Patient Instructions on file for this visit. No follow-ups on file.   Kris Hartmann, NP

## 2018-08-27 NOTE — Anesthesia Postprocedure Evaluation (Signed)
Anesthesia Post Note  Patient: Tara Holmes  Procedure(s) Performed: AV FISTULA INSERTION W/RF MAGNETIC GUIDANCE (Left )  Patient location during evaluation: PACU Anesthesia Type: General Level of consciousness: awake and alert Pain management: pain level controlled Vital Signs Assessment: post-procedure vital signs reviewed and stable Respiratory status: spontaneous breathing, nonlabored ventilation and respiratory function stable Cardiovascular status: blood pressure returned to baseline and stable Postop Assessment: no apparent nausea or vomiting Anesthetic complications: no     Last Vitals:  Vitals:   08/08/18 1530 08/08/18 1600  BP: (!) 124/55 134/69  Pulse: 69 74  Resp: 14 15  Temp:    SpO2: 98% 100%    Last Pain:  Vitals:   08/08/18 1600  TempSrc:   PainSc: 0-No pain                 Alphonsus Sias

## 2018-09-20 ENCOUNTER — Telehealth: Payer: Self-pay | Admitting: *Deleted

## 2018-09-20 NOTE — Telephone Encounter (Signed)
PRIMARY  CARDIOLOGIST; DR [facility] Pre-operative Risk Assessment    Request for surgical clearance:  1. What type of surgery is being performed? LEFT ARM Fel[place] FISTULA  2. When is this surgery scheduled?  TBD  3. What type of clearance is required (medical clearance vs. Pharmacy clearance to hold med vs. Both)? MEDICAL  4. Are there any medications that need to be held prior to surgery and how long?N/A  5. Practice name and name of physician performing surgery? [facility]; DR [place]  6. What is your office phone number   [phone]   7.   What is your office fax number [phone]   8.   Anesthesia type (None, local, MAC, general) ? UNKNOWN   Raiford Simmonds 09/20/2018, 5:18 PM  _________________________________________________________________   (provider comments below)

## 2018-09-21 NOTE — Telephone Encounter (Signed)
Spoke with patient regarding surgical clearance request. Pt states that she has already had the surgery on 08/07/18 and has been seen by Dr. Evelina Bucy in follow up and has done well. She was instructed to keep with her annual cardiology appointment with Dr. Claiborne Billings, due 07/2019 unless needed otherwise.    Kathyrn Drown NP-C [place] Pager: [phone]

## 2018-09-27 ENCOUNTER — Ambulatory Visit (INDEPENDENT_AMBULATORY_CARE_PROVIDER_SITE_OTHER): Payer: Medicare Other

## 2018-09-27 ENCOUNTER — Ambulatory Visit (INDEPENDENT_AMBULATORY_CARE_PROVIDER_SITE_OTHER): Payer: Medicare Other | Admitting: Vascular Surgery

## 2018-09-27 ENCOUNTER — Encounter (INDEPENDENT_AMBULATORY_CARE_PROVIDER_SITE_OTHER): Payer: Self-pay | Admitting: Vascular Surgery

## 2018-09-27 VITALS — BP 112/59 | HR 73 | Resp 16 | Ht 63.5 in | Wt 177.0 lb

## 2018-09-27 DIAGNOSIS — N185 Chronic kidney disease, stage 5: Secondary | ICD-10-CM

## 2018-09-28 ENCOUNTER — Encounter (INDEPENDENT_AMBULATORY_CARE_PROVIDER_SITE_OTHER): Payer: Self-pay | Admitting: Vascular Surgery

## 2018-09-28 NOTE — Progress Notes (Signed)
Patient ID: Tara Holmes, female   DOB: 1946/05/23, 72 y.o.   MRN: [id number]  Chief Complaint  Patient presents with  . Follow-up    1 month HDA    HPI Tara Holmes is a 72 y.o. female.    Patient denies left arm pain no hand pain.   Past Medical History:  Diagnosis Date  . Anemia   . Aneurysm ([place]) 1978  . Chronic kidney disease    ESRD  . Diabetes ([place])   . GERD (gastroesophageal reflux disease)   . Hypertension   . Sleep apnea     Past Surgical History:  Procedure Laterality Date  . AV FISTULA INSERTION W/ RF MAGNETIC GUIDANCE Left 08/08/2018   Procedure: AV FISTULA INSERTION W/RF MAGNETIC GUIDANCE;  Surgeon: Katha Cabal, MD;  Location: [facility];  Service: Cardiovascular;  Laterality: Left;  . BREAST SURGERY Left    fibroid  . TONSILLECTOMY        Allergies  Allergen Reactions  . Amoxicillin Other (See Comments)    Pt states allergic reaction in vaginal region. Has patient had a PCN reaction causing immediate rash, facial/tongue/throat swelling, SOB or lightheadedness with hypotension: No Has patient had a PCN reaction causing severe rash involving mucus membranes or skin necrosis: No Has patient had a PCN reaction that required hospitalization: No Has patient had a PCN reaction occurring within the last 10 years: No Yeast infection reaction ONLY If all of the above answers are "NO", then may proceed with Ce  . Atorvastatin Other (See Comments)    MUSCLE ACHES   . Prednisone Other (See Comments)    ELEVATED BLOOD SUGAR  . Contrast Media [Iodinated Diagnostic Agents] Other (See Comments)    PATIENT IS UNAWARE OF THIS ALLERGY OR ANY REACTION TO THIS MEDICATION.    Current Outpatient Medications  Medication Sig Dispense Refill  . ACCU-CHEK COMPACT PLUS test strip CHECK BLOOD SUGAR ONCE A DAY  3  . acetaminophen (TYLENOL) 650 MG CR tablet Take 650 mg by mouth every 8 (eight) hours as needed for pain.    Marland Kitchen  acyclovir (ZOVIRAX) 200 MG capsule Take 200 mg by mouth daily as needed (for cold sores/fever blisters.).     Marland Kitchen amLODipine (NORVASC) 10 MG tablet Take 5 mg by mouth daily.    . carvedilol (COREG) 25 MG tablet Take 25 mg by mouth 2 (two) times daily.     Marland Kitchen ezetimibe (ZETIA) 10 MG tablet Take 10 mg by mouth daily.    . ferrous sulfate 325 (65 FE) MG EC tablet Take 325 mg by mouth every evening.   0  . furosemide (LASIX) 40 MG tablet Take 40 mg by mouth daily.     Marland Kitchen glimepiride (AMARYL) 2 MG tablet Take 1 mg by mouth daily as needed (elevated blood sugar).     Marland Kitchen HYDROcodone-acetaminophen (NORCO) 5-325 MG tablet Take 1-2 tablets by mouth every 6 (six) hours as needed for moderate pain or severe pain. 50 tablet 0  . indomethacin (INDOCIN) 25 MG capsule Take 25 mg by mouth daily as needed. Gout  0  . Investigational - Study Medication Take 300 mg by mouth daily. Study drug: Vadadustat TAKE 2 TABLETS BY MOUTH EVERY MORNING Additional study details: Obtains at [facility]. 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    Marland Kitchen linagliptin (TRADJENTA) 5 MG TABS tablet Take 5 mg by mouth daily as needed (for elevated blood sugar).     Marland Kitchen losartan (  COZAAR) 100 MG tablet Take 100 mg by mouth daily.    Marland Kitchen omega-3 acid ethyl esters (LOVAZA) 1 g capsule Take 1 g by mouth daily.    Marland Kitchen oxybutynin (DITROPAN) 5 MG tablet Take 5 mg by mouth 2 (two) times daily.     . predniSONE (DELTASONE) 10 MG tablet Take 30-40 mg by mouth daily as needed. For gout  0  . ranitidine (ZANTAC) 150 MG capsule Take 150 mg by mouth daily as needed (for drainage).     . rosuvastatin (CRESTOR) 10 MG tablet Take 10 mg by mouth every evening.   3  . traMADol (ULTRAM) 50 MG tablet Take by mouth every 4 (four) hours as needed.     No current facility-administered medications for this visit.         Physical Exam BP (!) 112/59 (BP Location: Right Arm, Patient Position: Sitting)   Pulse 73   Resp 16   Ht 5' 3.5" (1.613 m)   Wt 177 lb (80.3 kg)    BMI 30.86 kg/m  Gen:  WD/WN, NAD Skin: incision C/D/I; good thrill good bruit in the cephalic vein at the antecubital fossa on the left     Assessment/Plan:  1. 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  At this time she is not on dialysis.  I will recheck her in 6 months.  It would appear that she will be ready to cannulate at the time she is ready for dialysis.  - VAS US DUPLEX DIALYSIS ACCESS (AVF, AVG); Future      Hortencia Pilar 09/28/2018, 5:12 PM   This note was created with Dragon medical transcription system.  Any errors from dictation are unintentional.

## 2018-10-16 ENCOUNTER — Telehealth: Payer: Self-pay | Admitting: Cardiovascular Disease

## 2018-10-16 DIAGNOSIS — G4733 Obstructive sleep apnea (adult) (pediatric): Secondary | ICD-10-CM

## 2018-10-16 DIAGNOSIS — Z9989 Dependence on other enabling machines and devices: Secondary | ICD-10-CM

## 2018-10-16 DIAGNOSIS — I671 Cerebral aneurysm, nonruptured: Secondary | ICD-10-CM

## 2018-10-16 NOTE — Telephone Encounter (Signed)
  Patient would like for Dr Claiborne Billings to send a referral over to [facility] because she has a hx of aneurysm. They won't see her without a referral.  [facility] Phone [phone] [address], [place]

## 2018-10-16 NOTE — Telephone Encounter (Signed)
rqst fwd to [facility] and his nurse [facility]

## 2018-10-18 NOTE — Telephone Encounter (Signed)
Referral placed.

## 2018-10-18 NOTE — Telephone Encounter (Signed)
Spoke with pt. Pt aware that neuro ref has been placed. Pt voiced appreciation for the call.

## 2018-10-18 NOTE — Telephone Encounter (Signed)
With history of brain aneurysm status post clipping, okay to refer to neurology

## 2018-11-25 ENCOUNTER — Emergency Department (HOSPITAL_COMMUNITY)
Admission: EM | Admit: 2018-11-25 | Discharge: 2018-11-25 | Disposition: A | Discharge status: Home / Self Care | Payer: Medicare Other | Attending: Emergency Medicine | Admitting: Emergency Medicine

## 2018-11-25 ENCOUNTER — Other Ambulatory Visit: Payer: Self-pay

## 2018-11-25 ENCOUNTER — Encounter (HOSPITAL_COMMUNITY): Payer: Self-pay | Admitting: Emergency Medicine

## 2018-11-25 DIAGNOSIS — M79671 Pain in right foot: Secondary | ICD-10-CM | POA: Diagnosis present

## 2018-11-25 DIAGNOSIS — Z79899 Other long term (current) drug therapy: Secondary | ICD-10-CM | POA: Insufficient documentation

## 2018-11-25 DIAGNOSIS — E119 Type 2 diabetes mellitus without complications: Secondary | ICD-10-CM | POA: Insufficient documentation

## 2018-11-25 DIAGNOSIS — R7989 Other specified abnormal findings of blood chemistry: Secondary | ICD-10-CM

## 2018-11-25 DIAGNOSIS — N185 Chronic kidney disease, stage 5: Secondary | ICD-10-CM | POA: Diagnosis not present

## 2018-11-25 DIAGNOSIS — N390 Urinary tract infection, site not specified: Secondary | ICD-10-CM | POA: Insufficient documentation

## 2018-11-25 DIAGNOSIS — I120 Hypertensive chronic kidney disease with stage 5 chronic kidney disease or end stage renal disease: Secondary | ICD-10-CM | POA: Insufficient documentation

## 2018-11-25 LAB — COMPREHENSIVE METABOLIC PANEL
ALK PHOS: 61 U/L (ref 38–126)
ALT: 19 U/L (ref 0–44)
AST: 14 U/L — ABNORMAL LOW (ref 15–41)
Albumin: 3 g/dL — ABNORMAL LOW (ref 3.5–5.0)
Anion gap: 11 (ref 5–15)
BUN: 46 mg/dL — AB (ref 8–23)
CALCIUM: 9.6 mg/dL (ref 8.9–10.3)
CO2: 24 mmol/L (ref 22–32)
CREATININE: 3.01 mg/dL — AB (ref 0.44–1.00)
Chloride: 99 mmol/L (ref 98–111)
GFR calc non Af Amer: 15 mL/min — ABNORMAL LOW (ref >60)
GFR, EST AFRICAN AMERICAN: 18 mL/min — AB (ref >60)
Glucose, Bld: 276 mg/dL — ABNORMAL HIGH (ref 70–99)
Potassium: 3.9 mmol/L (ref 3.5–5.1)
SODIUM: 134 mmol/L — AB (ref 135–145)
Total Bilirubin: 1.5 mg/dL — ABNORMAL HIGH (ref 0.3–1.2)
Total Protein: 7.5 g/dL (ref 6.5–8.1)

## 2018-11-25 LAB — URINALYSIS, ROUTINE W REFLEX MICROSCOPIC
Bilirubin Urine: NEGATIVE
Glucose, UA: 50 mg/dL — AB
Ketones, ur: NEGATIVE mg/dL
Nitrite: NEGATIVE
Protein, ur: NEGATIVE mg/dL
SPECIFIC GRAVITY, URINE: 1.008 (ref 1.005–1.030)
pH: 5 (ref 5.0–8.0)

## 2018-11-25 LAB — CBC WITH DIFFERENTIAL/PLATELET
ABS IMMATURE GRANULOCYTES: 0.04 10*3/uL (ref 0.00–0.07)
Basophils Absolute: 0 10*3/uL (ref 0.0–0.1)
Basophils Relative: 0 %
Eosinophils Absolute: 0.1 10*3/uL (ref 0.0–0.5)
Eosinophils Relative: 1 %
HCT: 33.8 % — ABNORMAL LOW (ref 36.0–46.0)
HEMOGLOBIN: 10.3 g/dL — AB (ref 12.0–15.0)
Immature Granulocytes: 0 %
Lymphocytes Relative: 8 %
Lymphs Abs: 1.1 10*3/uL (ref 0.7–4.0)
MCH: 27 pg (ref 26.0–34.0)
MCHC: 30.5 g/dL (ref 30.0–36.0)
MCV: 88.5 fL (ref 80.0–100.0)
MONO ABS: 1.4 10*3/uL — AB (ref 0.1–1.0)
MONOS PCT: 10 %
NEUTROS ABS: 10.7 10*3/uL — AB (ref 1.7–7.7)
Neutrophils Relative %: 81 %
Platelets: 220 10*3/uL (ref 150–400)
RBC: 3.82 MIL/uL — AB (ref 3.87–5.11)
RDW: 13.8 % (ref 11.5–15.5)
WBC: 13.2 10*3/uL — ABNORMAL HIGH (ref 4.0–10.5)
nRBC: 0 % (ref 0.0–0.2)

## 2018-11-25 LAB — URIC ACID: Uric Acid, Serum: 10.1 mg/dL — ABNORMAL HIGH (ref 2.5–7.1)

## 2018-11-25 MED ORDER — KETOROLAC TROMETHAMINE 60 MG/2ML IM SOLN
60.0000 mg | Freq: Once | INTRAMUSCULAR | Status: AC
  Administered 2018-11-25: 60 mg via INTRAMUSCULAR
  Filled 2018-11-25: qty 2

## 2018-11-25 MED ORDER — OXYCODONE-ACETAMINOPHEN 5-325 MG PO TABS: 1.0000 | ORAL_TABLET | ORAL | 0 refills | Status: DC | PRN

## 2018-11-25 MED ORDER — CEPHALEXIN 500 MG PO CAPS: 500.0000 mg | ORAL_CAPSULE | Freq: Four times a day (QID) | ORAL | 0 refills | Status: DC

## 2018-11-25 NOTE — ED Notes (Signed)
Pt aware a urine specimen is needed. Will notify staff when one can be obtained. 

## 2018-11-25 NOTE — Discharge Instructions (Addendum)
Recommend taking one prednisone tablet a day.  Prescription for pain medicine and antibiotic.  Kidney function is stable.  Unfortunately, I could not E prescribe your medications.

## 2018-11-25 NOTE — ED Notes (Signed)
Phlebotomy at bedside.

## 2018-11-25 NOTE — ED Triage Notes (Signed)
Patient c/o bilateral foot pain related to gout. Patient seen in PCP in [place] on Wednesday for a 3 month check up and had blood test. Patient told all blood test were normal except for her uric acid was elevated. Patient states started to have the foot pain 2 days later. Patient put on allopurinol 100mg , per patient she has taken 2 doses with no improvement in pain. Patient states she has been diagnosed with gout in past but has never had pain to this extent.

## 2018-11-25 NOTE — ED Provider Notes (Signed)
[facility] EMERGENCY DEPARTMENT Provider Note   CSN: [id number] Arrival date & time: 11/25/18  1317     History   Chief Complaint Chief Complaint  Patient presents with  . Foot Pain    HPI Tara Holmes is a 72 y.o. female.  Bilateral foot pain right greater than left for several days.  She was seen by her primary care doctor and her uric acid was elevated.  She was started on allopurinol, but this has not helped much.  She has had gout in the past.  No calf or posterior thigh pain.  No dyspnea, chest pain, fever, chills.  No known trauma.  Past medical history includes chronic kidney disease, diabetes, hypertension.  Review of systems positive for urinary frequency, but no dysuria     Past Medical History:  Diagnosis Date  . Anemia   . Aneurysm ([place]) 1978  . Chronic kidney disease    ESRD  . Diabetes ([place])   . GERD (gastroesophageal reflux disease)   . Hypertension   . Sleep apnea     Patient Active Problem List   Diagnosis Date Noted  . 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 OSA on CPAP 01/21/2014  . HTN (hypertension) 01/21/2014  . Hyperlipidemia with target LDL less than 70 01/21/2014  . DM2 (diabetes mellitus, type 2) ([place]) 01/21/2014  . Brain aneurysm 01/21/2014    Past Surgical History:  Procedure Laterality Date  . AV FISTULA INSERTION W/ RF MAGNETIC GUIDANCE Left 08/08/2018   Procedure: AV FISTULA INSERTION W/RF MAGNETIC GUIDANCE;  Surgeon: Katha Cabal, MD;  Location: [facility];  Service: Cardiovascular;  Laterality: Left;  . BREAST SURGERY Left    fibroid  . TONSILLECTOMY       OB History    Gravida  2   Para  2   Term  2   Preterm      AB      Living  2     SAB      TAB      Ectopic      Multiple      Live Births               Home Medications    Prior to Admission medications   Medication Sig Start Date End Date Taking? Authorizing Provider  amLODipine (NORVASC) 10 MG tablet  Take 5 mg by mouth every morning.    Yes [provider]  carvedilol (COREG) 25 MG tablet Take 25 mg by mouth 2 (two) times daily.  03/11/14  Yes [provider]  ezetimibe (ZETIA) 10 MG tablet Take 10 mg by mouth daily.   Yes [provider]  ferrous sulfate 325 (65 FE) MG EC tablet Take 325 mg by mouth every evening.  06/23/17  Yes [provider]  furosemide (LASIX) 40 MG tablet Take 40 mg by mouth daily.    Yes [provider]  glimepiride (AMARYL) 2 MG tablet Take 1 mg by mouth daily as needed (elevated blood sugar).    Yes [provider]  Investigational - Study Medication Take 450 mg by mouth every morning. Study name:VADADUSTAT Additional study details:150mg  Tablets prescribed by [facility] Nephrology/Nuerology   Yes [provider]  linagliptin (TRADJENTA) 5 MG TABS tablet Take 5 mg by mouth daily as needed (for elevated blood sugar).    Yes [provider]  losartan (COZAAR) 100 MG tablet Take 100 mg by mouth daily.   Yes [provider]  omega-3 acid ethyl esters (LOVAZA) 1 g capsule Take 1 g by mouth daily.   Yes [provider]  oxybutynin (DITROPAN) 5 MG tablet Take 5 mg by mouth 2 (two) times daily.    Yes [provider]  rosuvastatin (CRESTOR) 10 MG tablet Take 10 mg by mouth every evening.  02/04/18  Yes [provider]  ACCU-CHEK COMPACT PLUS test strip CHECK BLOOD SUGAR ONCE A DAY 05/07/18   [provider]  cephALEXin (KEFLEX) 500 MG capsule Take 1 capsule (500 mg total) by mouth 4 (four) times daily. 11/25/18   Nat Christen, MD  oxyCODONE-acetaminophen (PERCOCET) 5-325 MG tablet Take 1 tablet by mouth every 4 (four) hours as needed. 11/25/18   Nat Christen, MD    Family History Family History  Problem Relation Age of Onset  . Heart attack Mother   . Diabetes Mother   . Colon cancer Father     Social History Social History   Tobacco Use  . Smoking status: Never  Smoker  . Smokeless tobacco: Never Used  Substance Use Topics  . Alcohol use: No  . Drug use: Never     Allergies   Amoxicillin; Atorvastatin; Prednisone; and Contrast media [iodinated diagnostic agents]   Review of Systems Review of Systems  All other systems reviewed and are negative.    Physical Exam Updated Vital Signs BP (!) 104/54 (BP Location: Right Arm)   Pulse 79   Temp 98.6 F (37 C) (Oral)   Resp 16   Ht 5\' 3"  (1.6 m)   Wt 80.7 kg   SpO2 99%   BMI 31.53 kg/m   Physical Exam  Constitutional: She is oriented to person, place, and time.  nad  HENT:  Head: Normocephalic and atraumatic.  Eyes: Conjunctivae are normal.  Neck: Neck supple.  Cardiovascular: Normal rate and regular rhythm.  Pulmonary/Chest: Effort normal and breath sounds normal.  Abdominal: Soft. Bowel sounds are normal.  Musculoskeletal:  Bilateral feet: Right greater than left swelling.  Difficult to ascertain joint tenderness.  No calf or posterior thigh tenderness.  Neurological: She is alert and oriented to person, place, and time.  Skin: Skin is warm and dry.  Psychiatric: She has a normal mood and affect. Her behavior is normal.  Nursing note and vitals reviewed.    ED Treatments / Results  Labs (all labs ordered are listed, but only abnormal results are displayed) Labs Reviewed  CBC WITH DIFFERENTIAL/PLATELET - Abnormal; Notable for the following components:      Result Value   WBC 13.2 (*)    RBC 3.82 (*)    Hemoglobin 10.3 (*)    HCT 33.8 (*)    Neutro Abs 10.7 (*)    Monocytes Absolute 1.4 (*)    All other components within normal limits  COMPREHENSIVE METABOLIC PANEL - Abnormal; Notable for the following components:   Sodium 134 (*)    Glucose, Bld 276 (*)    BUN 46 (*)    Creatinine, Ser 3.01 (*)    Albumin 3.0 (*)    AST 14 (*)    Total Bilirubin 1.5 (*)    GFR calc non Af Amer 15 (*)    GFR calc Af Amer 18 (*)    All other components within normal limits    URIC ACID - Abnormal; Notable for the following components:   Uric Acid, Serum 10.1 (*)    All other components within normal limits  URINALYSIS, ROUTINE W REFLEX MICROSCOPIC -  Abnormal; Notable for the following components:   APPearance HAZY (*)    Glucose, UA 50 (*)    Hgb urine dipstick SMALL (*)    Leukocytes, UA SMALL (*)    Bacteria, UA MANY (*)    All other components within normal limits    EKG None  Radiology No results found.  Procedures Procedures (including critical care time)  Medications Ordered in ED Medications  ketorolac (TORADOL) injection 60 mg (60 mg Intramuscular Given 11/25/18 1452)     Initial Impression / Assessment and Plan / ED Course  I have reviewed the triage vital signs and the nursing notes.  Pertinent labs & imaging results that were available during my care of the patient were reviewed by me and considered in my medical decision making (see chart for details).    Patient is in no acute distress.  Her chief complaint is right greater than left foot swelling.  History of gout.  No clinical evidence of DVT.  Uric acid is elevated.  Creatinine elevated, but this is not new.  Toradol 60 mg IM in the emergency department.  Will take 10 mg of prednisone daily.  Prescription for Percocet and Keflex 500 mg.  All these findings were discussed with the patient and her husband.   Final Clinical Impressions(s) / ED Diagnoses   Final diagnoses:  Right foot pain  Urinary tract infection without hematuria, site unspecified  Elevated serum creatinine    ED Discharge Orders         Ordered    oxyCODONE-acetaminophen (PERCOCET) 5-325 MG tablet  Every 4 hours PRN     11/25/18 2003    cephALEXin (KEFLEX) 500 MG capsule  4 times daily     11/25/18 2003           Nat Christen, MD 11/25/18 2138

## 2018-12-24 ENCOUNTER — Encounter: Payer: Self-pay | Admitting: Diagnostic Neuroimaging

## 2018-12-24 ENCOUNTER — Ambulatory Visit (INDEPENDENT_AMBULATORY_CARE_PROVIDER_SITE_OTHER): Payer: Medicare Other | Admitting: Diagnostic Neuroimaging

## 2018-12-24 VITALS — BP 120/66 | HR 69 | Ht 63.0 in | Wt 179.2 lb

## 2018-12-24 DIAGNOSIS — I671 Cerebral aneurysm, nonruptured: Secondary | ICD-10-CM

## 2018-12-24 NOTE — Progress Notes (Signed)
[facility]  PATIENT: Tara Holmes DOB: 02/08/1946  REFERRING CLINICIAN: Shelva Majestic, MD  HISTORY FROM: Patient  REASON FOR VISIT: New consult / Follow up: H/O Aneurysm, s/p clipping     HISTORICAL  CHIEF COMPLAINT:  Chief Complaint  Patient presents with  . NP    Rm 6, alone (husband out in waiting area)  . Brain Aneurysm, clipping, asking about having MRI    Checking to see if can have MRI, is having hip pain, (Dr.     HISTORY OF PRESENT ILLNESS:   Tara Holmes is a 73 y/o female with significant medical history of CKD stage V, HTN, HLD, DMT2, OSA on CPAP, single seizure and left brain aneurysm s/p clipping in 1978. She presents today to inquire about having an MRI for hip and knee pain. She is concerned that she is not a candidate for MRI due to the brain clip placed in 1978.  She also has a history of intermittent headaches and one isolated seizure following the brain aneurysm but has not had any trouble recently. 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 She was started Carbatrol at that time and discontinued in 2010 after repeat EEG with Dr Brandon Melnick, neurology, was normal. She denies any seizure like activity since. Not having any headaches at this time. She has no other concerns today.   She reports being followed by orthopedics for right hip and knee pain. She reports that her provider advised an MRI but she is hesitant due to having a brain clip. She reports that pain has improved since her last visit in 10/2018 so she has not returned for follow up with ortho.     REVIEW OF SYSTEMS: Full 14 system review of systems performed and negative with exception of: joint pain, joint swelling, anemia, sleep apnea  ALLERGIES: Allergies  Allergen Reactions  . Amoxicillin Other (See Comments)    Pt states allergic reaction in vaginal region. Has patient had a PCN reaction causing immediate rash, facial/tongue/throat swelling,  SOB or lightheadedness with hypotension: No Has patient had a PCN reaction causing severe rash involving mucus membranes or skin necrosis: No Has patient had a PCN reaction that required hospitalization: No Has patient had a PCN reaction occurring within the last 10 years: No Yeast infection reaction ONLY If all of the above answers are "NO", then may proceed with Ce  . Atorvastatin Other (See Comments)    MUSCLE ACHES   . Prednisone Other (See Comments)    ELEVATED BLOOD SUGAR  . Contrast Media [Iodinated Diagnostic Agents] Other (See Comments)    PATIENT IS UNAWARE OF THIS ALLERGY OR ANY REACTION TO THIS MEDICATION.    HOME MEDICATIONS: Outpatient Medications Prior to Visit  Medication Sig Dispense Refill  . ACCU-CHEK COMPACT PLUS test strip CHECK BLOOD SUGAR ONCE A DAY  3  . amLODipine (NORVASC) 10 MG tablet Take 5 mg by mouth every morning.     . carvedilol (COREG) 25 MG tablet Take 25 mg by mouth 2 (two) times daily.     Marland Kitchen ezetimibe (ZETIA) 10 MG tablet Take 10 mg by mouth daily.    . ferrous sulfate 325 (65 FE) MG EC tablet Take 325 mg by mouth every evening.   0  . furosemide (LASIX) 40 MG tablet Take 40 mg by mouth daily.     Marland Kitchen glimepiride (AMARYL) 2 MG tablet Take 1 mg by mouth daily as needed (elevated blood sugar).     Marland Kitchen  Investigational - Study Medication Take 450 mg by mouth every morning. Study name:VADADUSTAT Additional study details:150mg  Tablets prescribed by [facility] Nephrology/Nuerology    . linagliptin (TRADJENTA) 5 MG TABS tablet Take 5 mg by mouth daily as needed (for elevated blood sugar).     Marland Kitchen losartan (COZAAR) 100 MG tablet Take 100 mg by mouth daily.    Marland Kitchen omega-3 acid ethyl esters (LOVAZA) 1 g capsule Take 1 g by mouth daily.    Marland Kitchen oxybutynin (DITROPAN) 5 MG tablet Take 5 mg by mouth 2 (two) times daily.     . rosuvastatin (CRESTOR) 10 MG tablet Take 10 mg by mouth every evening.   3  . cephALEXin (KEFLEX) 500 MG capsule Take 1 capsule (500 mg total) by mouth  4 (four) times daily. (Patient not taking: Reported on 12/24/2018) 20 capsule 0  . oxyCODONE-acetaminophen (PERCOCET) 5-325 MG tablet Take 1 tablet by mouth every 4 (four) hours as needed. (Patient not taking: Reported on 12/24/2018) 15 tablet 0   No facility-administered medications prior to visit.     PAST MEDICAL HISTORY: Past Medical History:  Diagnosis Date  . Anemia   . Aneurysm ([place]) 1978  . Chronic kidney disease    ESRD  . Diabetes ([place])   . GERD (gastroesophageal reflux disease)   . Hypertension   . Sleep apnea     PAST SURGICAL HISTORY: Past Surgical History:  Procedure Laterality Date  . AV FISTULA INSERTION W/ RF MAGNETIC GUIDANCE Left 08/08/2018   Procedure: AV FISTULA INSERTION W/RF MAGNETIC GUIDANCE;  Surgeon: Katha Cabal, MD;  Location: [place] [facility];  Service: Cardiovascular;  Laterality: Left;  . BREAST SURGERY Left    fibroid  . TONSILLECTOMY      FAMILY HISTORY: Family History  Problem Relation Age of Onset  . Heart attack Mother   . Diabetes Mother   . Colon cancer Father     SOCIAL HISTORY: Social History   Socioeconomic History  . Marital status: Married    Spouse name: Percell Miller  . Number of children: 2  . Years of education: MA  . Highest education level: Not on file  Occupational History  . Occupation: Retired  Scientific laboratory technician  . Financial resource strain: Not on file  . Food insecurity:    Worry: Not on file    Inability: Not on file  . Transportation needs:    Medical: Not on file    Non-medical: Not on file  Tobacco Use  . Smoking status: Never Smoker  . Smokeless tobacco: Never Used  Substance and Sexual Activity  . Alcohol use: No  . Drug use: Never  . Sexual activity: Not on file  Lifestyle  . Physical activity:    Days per week: Not on file    Minutes per session: Not on file  . Stress: Not on file  Relationships  . Social connections:    Talks on phone: Not on file    Gets together: Not on file    Attends  religious service: Not on file    Active member of club or organization: Not on file    Attends meetings of clubs or organizations: Not on file    Relationship status: Not on file  . Intimate partner violence:    Fear of current or ex partner: Not on file    Emotionally abused: Not on file    Physically abused: Not on file    Forced sexual activity: Not on file  Other Topics Concern  .  Not on file  Social History Narrative   Patient lives at home with spouse.   Caffeine Use: none     PHYSICAL EXAM  GENERAL EXAM/CONSTITUTIONAL: Vitals:  Vitals:   12/24/18 1022  BP: 120/66  Pulse: 69  Weight: 179 lb 3.2 oz (81.3 kg)  Height: 5\' 3"  (1.6 m)   Body mass index is 31.74 kg/m. Wt Readings from Last 3 Encounters:  12/24/18 179 lb 3.2 oz (81.3 kg)  11/25/18 178 lb (80.7 kg)  09/27/18 177 lb (80.3 kg)    Patient is in no distress; well developed, nourished and groomed; neck is supple  CARDIOVASCULAR:  Examination of carotid arteries is normal; no carotid bruits  Regular rate and rhythm, SYSTOLIC MURMUR --> RADIATES TO LEFT CAROTID  Examination of peripheral vascular system by observation and palpation is normal  EYES:  Ophthalmoscopic exam of optic discs and posterior segments is normal; no papilledema or hemorrhages  MUSCULOSKELETAL:  Gait, strength, tone, movements noted in Neurologic exam below  NEUROLOGIC:   awake, alert, oriented to person, place and time  recent and remote memory intact  normal attention and concentration  language fluent, comprehension intact, naming intact  fund of knowledge appropriate  CRANIAL NERVE:   2nd - no papilledema on fundoscopic exam  2nd, 3rd, 4th, 6th - pupils equal and reactive to light, visual fields full to confrontation, extraocular muscles intact, no nystagmus  5th - facial sensation symmetric  7th - facial strength symmetric  8th - hearing intact  9th - palate elevates symmetrically, uvula midline  11th  - shoulder shrug symmetric  12th - tongue protrusion midline  MOTOR:   normal bulk and tone, full strength in the BUE, BLE  SENSORY:   normal and symmetric to light touch, temperature, vibration  COORDINATION:   finger-nose-finger  REFLEXES:   deep tendon reflexes present of right upper and lower extremities and symmetric (unable to test left upper extremity due to recently placed fistula.)  GAIT/STATION:   narrow based gait; romberg is negative     DIAGNOSTIC DATA (LABS, IMAGING, TESTING) - I reviewed patient records, labs, notes, testing and imaging myself where available.  Lab Results  Component Value Date   WBC 13.2 (H) 11/25/2018   HGB 10.3 (L) 11/25/2018   HCT 33.8 (L) 11/25/2018   MCV 88.5 11/25/2018   PLT 220 11/25/2018      Component Value Date/Time   NA 134 (L) 11/25/2018 1507   K 3.9 11/25/2018 1507   CL 99 11/25/2018 1507   CO2 24 11/25/2018 1507   GLUCOSE 276 (H) 11/25/2018 1507   BUN 46 (H) 11/25/2018 1507   CREATININE 3.01 (H) 11/25/2018 1507   CALCIUM 9.6 11/25/2018 1507   PROT 7.5 11/25/2018 1507   ALBUMIN 3.0 (L) 11/25/2018 1507   AST 14 (L) 11/25/2018 1507   ALT 19 11/25/2018 1507   ALKPHOS 61 11/25/2018 1507   BILITOT 1.5 (H) 11/25/2018 1507   GFRNONAA 15 (L) 11/25/2018 1507   GFRAA 18 (L) 11/25/2018 1507   No results found for: CHOL, HDL, LDLCALC, LDLDIRECT, TRIG, CHOLHDL No results found for: HGBA1C No results found for: VITAMINB12 No results found for: TSH   11/25/2012  CT Brain w/enhancement  1. Status post aneurysm clipping in the region of the left proximal MCA. 2. No evidence of recurrent aneurysm.   11/25/2012 Vertebral Arteriogram  1. 3 mm infundibulum at the origin of the right posterior communicating artery. 2. No definite definite evidence of aneurysm is seen. 3. Post-surgical  changes, consistent with a history of previous aneurysm clipping.   ASSESSMENT AND PLAN  73 y.o. year old female here with history of  left brain aneurysm in 1978. She is having trouble with right hip and knee pain followed by orthopedics and is wanting to have an MRI. She is concerned about having MRI due to brain clip placed in 1978. She is unable to tolerate contrast due to CKD.    Dx:  1. Brain aneurysm      PLAN:  BRAIN ANEURYSM (1978) - cannot have MRI hip due to history of aneurysm clipping in 1978  Return as needed, for return to PCP.

## 2019-04-01 ENCOUNTER — Encounter (INDEPENDENT_AMBULATORY_CARE_PROVIDER_SITE_OTHER): Payer: Medicare Other

## 2019-04-01 ENCOUNTER — Ambulatory Visit (INDEPENDENT_AMBULATORY_CARE_PROVIDER_SITE_OTHER): Payer: Medicare Other | Admitting: Vascular Surgery

## 2019-04-16 DIAGNOSIS — N189 Chronic kidney disease, unspecified: Secondary | ICD-10-CM | POA: Insufficient documentation

## 2019-04-16 DIAGNOSIS — D631 Anemia in chronic kidney disease: Secondary | ICD-10-CM | POA: Insufficient documentation

## 2019-05-09 ENCOUNTER — Ambulatory Visit (INDEPENDENT_AMBULATORY_CARE_PROVIDER_SITE_OTHER): Payer: Medicare Other

## 2019-05-09 ENCOUNTER — Ambulatory Visit (INDEPENDENT_AMBULATORY_CARE_PROVIDER_SITE_OTHER): Payer: Medicare Other | Admitting: Vascular Surgery

## 2019-05-09 ENCOUNTER — Encounter (INDEPENDENT_AMBULATORY_CARE_PROVIDER_SITE_OTHER): Payer: Self-pay | Admitting: Vascular Surgery

## 2019-05-09 ENCOUNTER — Other Ambulatory Visit: Payer: Self-pay

## 2019-05-09 VITALS — BP 132/71 | HR 76 | Resp 10 | Ht 63.0 in | Wt 175.0 lb

## 2019-05-09 DIAGNOSIS — E1122 Type 2 diabetes mellitus with diabetic chronic kidney disease: Secondary | ICD-10-CM | POA: Diagnosis not present

## 2019-05-09 DIAGNOSIS — I120 Hypertensive chronic kidney disease with stage 5 chronic kidney disease or end stage renal disease: Secondary | ICD-10-CM

## 2019-05-09 DIAGNOSIS — E1121 Type 2 diabetes mellitus with diabetic nephropathy: Secondary | ICD-10-CM

## 2019-05-09 DIAGNOSIS — I10 Essential (primary) hypertension: Secondary | ICD-10-CM

## 2019-05-09 DIAGNOSIS — N185 Chronic kidney disease, stage 5: Secondary | ICD-10-CM

## 2019-05-09 DIAGNOSIS — E785 Hyperlipidemia, unspecified: Secondary | ICD-10-CM

## 2019-05-09 DIAGNOSIS — Z79899 Other long term (current) drug therapy: Secondary | ICD-10-CM

## 2019-05-19 ENCOUNTER — Encounter (INDEPENDENT_AMBULATORY_CARE_PROVIDER_SITE_OTHER): Payer: Self-pay | Admitting: Vascular Surgery

## 2019-05-19 NOTE — Progress Notes (Signed)
MRN : [id number]  Tara Holmes is a 73 y.o. (1946/12/18) female who presents with chief complaint of  Chief Complaint  Patient presents with   Follow-up  .  History of Present Illness:   The patient returns to the office for followup of their dialysis access. The function of the access has been stable. She is s/p left arm  [place] fistula creation on 08/08/2018.   The patient denies hand pain or other symptoms consistent with steal phenomena.  No significant arm swelling.  The patient denies redness or swelling at the access site. The patient denies fever or chills at home or while on dialysis.  The patient denies amaurosis fugax or recent TIA symptoms. There are no recent neurological changes noted. The patient denies claudication symptoms or rest pain symptoms. The patient denies history of DVT, PE or superficial thrombophlebitis. The patient denies recent episodes of angina or shortness of breath.   Duplex ultrasound of the AV access shows a patent access with uniform velocities.  No focal hemodynamically significant stenosis.  Volume flow is 1250 cc/min.       Current Meds  Medication Sig   ACCU-CHEK COMPACT PLUS test strip CHECK BLOOD SUGAR ONCE A DAY   allopurinol (ZYLOPRIM) 100 MG tablet allopurinol 100 mg tablet  Take 1 tablet every day by oral route.   amLODipine (NORVASC) 10 MG tablet Take 5 mg by mouth every morning.    carvedilol (COREG) 25 MG tablet Take 25 mg by mouth 2 (two) times daily.    ferrous sulfate 325 (65 FE) MG EC tablet Take 325 mg by mouth every evening.    furosemide (LASIX) 40 MG tablet Take 40 mg by mouth every other day.    glimepiride (AMARYL) 2 MG tablet Take 1 mg by mouth daily as needed (elevated blood sugar).    losartan (COZAAR) 100 MG tablet Take 100 mg by mouth daily.   omega-3 acid ethyl esters (LOVAZA) 1 g capsule Take 1 g by mouth daily.   oxybutynin (DITROPAN) 5 MG tablet Take 5 mg by mouth 2 (two) times daily.      Past Medical History:  Diagnosis Date   Anemia    Aneurysm ([place]) 1978   Chronic kidney disease    ESRD   Diabetes ([place])    GERD (gastroesophageal reflux disease)    Hypertension    Sleep apnea     Past Surgical History:  Procedure Laterality Date   AV FISTULA INSERTION W/ RF MAGNETIC GUIDANCE Left 08/08/2018   Procedure: AV FISTULA INSERTION W/RF MAGNETIC GUIDANCE;  Surgeon: Katha Cabal, MD;  Location: [facility];  Service: Cardiovascular;  Laterality: Left;   BREAST SURGERY Left    fibroid   TONSILLECTOMY      Social History Social History   Tobacco Use   Smoking status: Never Smoker   Smokeless tobacco: Never Used  Substance Use Topics   Alcohol use: No   Drug use: Never    Family History Family History  Problem Relation Age of Onset   Heart attack Mother    Diabetes Mother    Colon cancer Father     Allergies  Allergen Reactions   Amoxicillin Other (See Comments)    Pt states allergic reaction in vaginal region. Has patient had a PCN reaction causing immediate rash, facial/tongue/throat swelling, SOB or lightheadedness with hypotension: No Has patient had a PCN reaction causing severe rash involving mucus membranes or skin necrosis: No Has patient had a  PCN reaction that required hospitalization: No Has patient had a PCN reaction occurring within the last 10 years: No Yeast infection reaction ONLY If all of the above answers are "NO", then may proceed with Ce   Atorvastatin Other (See Comments)    MUSCLE ACHES    Prednisone Other (See Comments)    ELEVATED BLOOD SUGAR   Contrast Media [Iodinated Diagnostic Agents] Other (See Comments)    PATIENT IS UNAWARE OF THIS ALLERGY OR ANY REACTION TO THIS MEDICATION.     REVIEW OF SYSTEMS (Negative unless checked)  Constitutional: [] Weight loss  [] Fever  [] Chills Cardiac: [] Chest pain   [] Chest pressure   [] Palpitations   [] Shortness of breath when laying flat    [] Shortness of breath with exertion. Vascular:  [] Pain in legs with walking   [] Pain in legs at rest  [] History of DVT   [] Phlebitis   [] Swelling in legs   [] Varicose veins   [] Non-healing ulcers Pulmonary:   [] Uses home oxygen   [] Productive cough   [] Hemoptysis   [] Wheeze  [] COPD   [] Asthma Neurologic:  [] Dizziness   [] Seizures   [] History of stroke   [] History of TIA  [] Aphasia   [] Vissual changes   [] Weakness or numbness in arm   [] Weakness or numbness in leg Musculoskeletal:   [] Joint swelling   [] Joint pain   [] Low back pain Hematologic:  [] Easy bruising  [] Easy bleeding   [] Hypercoagulable state   [] Anemic Gastrointestinal:  [] Diarrhea   [] Vomiting  [] Gastroesophageal reflux/heartburn   [] Difficulty swallowing. Genitourinary:  [x] Chronic kidney disease   [] Difficult urination  [] Frequent urination   [] Blood in urine Skin:  [] Rashes   [] Ulcers  Psychological:  [] History of anxiety   []  History of major depression.  Physical Examination  Vitals:   05/09/19 1431  BP: 132/71  Pulse: 76  Resp: 10  Weight: 175 lb (79.4 kg)  Height: 5\' 3"  (1.6 m)   Body mass index is 31 kg/m. Gen: WD/WN, NAD Head: [place]/AT, No temporalis wasting.  Ear/Nose/Throat: Hearing grossly intact, nares w/o erythema or drainage Eyes: PER, EOMI, sclera nonicteric.  Neck: Supple, no large masses.   Pulmonary:  Good air movement, no audible wheezing bilaterally, no use of accessory muscles.  Cardiac: RRR, no JVD Vascular: left arm easily palpable fistula Vessel Right Left  Radial Palpable Palpable  Brachial Palpable Palpable  Carotid Palpable Palpable  Gastrointestinal: Non-distended. No guarding/no peritoneal signs.  Musculoskeletal: M/S 5/5 throughout.  No deformity or atrophy.  Neurologic: CN 2-12 intact. Symmetrical.  Speech is fluent. Motor exam as listed above. Psychiatric: Judgment intact, Mood & affect appropriate for pt's clinical situation. Dermatologic: No rashes or ulcers noted.  No changes  consistent with cellulitis. Lymph : No lichenification or skin changes of chronic lymphedema.  CBC Lab Results  Component Value Date   WBC 13.2 (H) 11/25/2018   HGB 10.3 (L) 11/25/2018   HCT 33.8 (L) 11/25/2018   MCV 88.5 11/25/2018   PLT 220 11/25/2018    BMET    Component Value Date/Time   NA 134 (L) 11/25/2018 1507   K 3.9 11/25/2018 1507   CL 99 11/25/2018 1507   CO2 24 11/25/2018 1507   GLUCOSE 276 (H) 11/25/2018 1507   BUN 46 (H) 11/25/2018 1507   CREATININE 3.01 (H) 11/25/2018 1507   CALCIUM 9.6 11/25/2018 1507   GFRNONAA 15 (L) 11/25/2018 1507   GFRAA 18 (L) 11/25/2018 1507   CrCl cannot be calculated (Patient's most recent lab result is older than the maximum  21 days allowed.).  COAG Lab Results  Component Value Date   INR 1.06 08/01/2018    Radiology No results found.    Assessment/Plan 1. Chronic kidney disease, stage V ([place]) Recommend:  The patient is doing well and currently has adequate dialysis access.  She has not yet started HD.  The patient's dialysis center is not reporting any access issues.  The patient should have a duplex ultrasound of the dialysis access prior to starting she voices understanding and will call the office before starting HD.  The patient will follow-up with me in the office after each ultrasound     2. Essential hypertension Continue antihypertensive medications as already ordered, these medications have been reviewed and there are no changes at this time.   3. Hyperlipidemia with target LDL less than 70 Continue statin as ordered and reviewed, no changes at this time   4. Type 2 diabetes mellitus with diabetic nephropathy, without long-term current use of insulin (HCC) Continue hypoglycemic medications as already ordered, these medications have been reviewed and there are no changes at this time.  Hgb A1C to be monitored as already arranged by primary service     Hortencia Pilar, MD  05/19/2019 4:53 PM

## 2019-06-26 ENCOUNTER — Telehealth: Payer: Self-pay | Admitting: Physician Assistant

## 2019-06-26 NOTE — Progress Notes (Signed)
Virtual Visit via Telephone Note   This visit type was conducted due to national recommendations for restrictions regarding the COVID-19 Pandemic (e.g. social distancing) in an effort to limit this patient's exposure and mitigate transmission in our community.  Due to her co-morbid illnesses, this patient is at least at moderate risk for complications without adequate follow up.  This format is felt to be most appropriate for this patient at this time.  The patient did not have access to video technology/had technical difficulties with video requiring transitioning to audio format only (telephone).  All issues noted in this document were discussed and addressed.  No physical exam could be performed with this format.  Please refer to the patient's chart for her  consent to telehealth for [facility].   Date:  06/27/2019   ID:  Tara Holmes, Case 1946/11/26, MRN [id number]  Patient Location: Home Provider Location: Home  PCP:  Pomposini, Cherly Anderson, MD  Cardiologist:  Shelva Majestic, MD  Electrophysiologist:  None   Evaluation Performed:  Follow-Up Visit  Chief Complaint:  Follow up  History of Present Illness:    Tara Holmes is a 73 y.o. female with hypertension, hyperlipidemia, DM obesity, and is s/p clipping of remote left brain aneurysm. 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  She sees Dr. Claiborne Billings for cardiology and OSA, on CPAP. Myoview on 04/19/17 negative for scar or ischemia. Echocardiogram with normal EF but grade 2 DD, mitral annular calcification.   She was last seen in clinic with Dr. Claiborne Billings on 08/16/18 and was doing well at that time. Carotid ultrasound performed 05/2019 for bruits on exam was without obstructive disease. She denies SOB, chest pain, orthopnea, lower extremity swelling, or orthopnea. She is doing a great job managing her multiple health problems. She is not yet on HD, but fistula in place and healed well.  The patient does not have symptoms  concerning for COVID-19 infection (fever, chills, cough, or new shortness of breath).    Past Medical History:  Diagnosis Date  . Anemia   . Aneurysm ([place]) 1978  . Chronic kidney disease    ESRD  . Diabetes ([place])   . GERD (gastroesophageal reflux disease)   . Hypertension   . Sleep apnea    Past Surgical History:  Procedure Laterality Date  . AV FISTULA INSERTION W/ RF MAGNETIC GUIDANCE Left 08/08/2018   Procedure: AV FISTULA INSERTION W/RF MAGNETIC GUIDANCE;  Surgeon: Katha Cabal, MD;  Location: [facility];  Service: Cardiovascular;  Laterality: Left;  . BREAST SURGERY Left    fibroid  . TONSILLECTOMY       Current Meds  Medication Sig  . ACCU-CHEK COMPACT PLUS test strip CHECK BLOOD SUGAR ONCE A DAY  . allopurinol (ZYLOPRIM) 100 MG tablet allopurinol 100 mg tablet  Take 1 tablet every day by oral route.  Marland Kitchen amLODipine (NORVASC) 10 MG tablet Take 5 mg by mouth every morning.   . carvedilol (COREG) 25 MG tablet Take 25 mg by mouth 2 (two) times daily.   Marland Kitchen ezetimibe (ZETIA) 10 MG tablet Take 10 mg by mouth daily.  . famotidine (PEPCID) 10 MG tablet Take by mouth daily.   . ferrous sulfate 325 (65 FE) MG EC tablet Take 325 mg by mouth every evening.   . furosemide (LASIX) 40 MG tablet Take 40 mg by mouth every other day.   Marland Kitchen glimepiride (AMARYL) 2 MG tablet Take 1 mg by mouth daily as needed (elevated  blood sugar).   Marland Kitchen linagliptin (TRADJENTA) 5 MG TABS tablet Take 5 mg by mouth daily as needed (for elevated blood sugar).   Marland Kitchen losartan (COZAAR) 100 MG tablet Take 100 mg by mouth daily.  Marland Kitchen omega-3 acid ethyl esters (LOVAZA) 1 g capsule Take 1 g by mouth daily.  Marland Kitchen oxybutynin (DITROPAN) 5 MG tablet Take 5 mg by mouth 2 (two) times daily.   . rosuvastatin (CRESTOR) 10 MG tablet Take 10 mg by mouth every evening.      Allergies:   Amoxicillin, Atorvastatin, Prednisone, and Contrast media [iodinated diagnostic agents]   Social History   Tobacco Use  . Smoking  status: Never Smoker  . Smokeless tobacco: Never Used  Substance Use Topics  . Alcohol use: No  . Drug use: Never     Family Hx: The patient's family history includes Colon cancer in her father; Diabetes in her mother; Heart attack in her mother.  ROS:   Please see the history of present illness.     All other systems reviewed and are negative.   Prior CV studies:   The following studies were reviewed today:  Carotid ultrasound 06/06/19: Nonobstructive disease  Labs/Other Tests and Data Reviewed:    EKG:  An ECG dated 08/01/18 was personally reviewed today and demonstrated:  sinus bradycardia with HR 55, TWI aVL, TW flattening in I  Recent Labs: 11/25/2018: ALT 19; BUN 46; Creatinine, Ser 3.01; Hemoglobin 10.3; Platelets 220; Potassium 3.9; Sodium 134   Recent Lipid Panel No results found for: CHOL, TRIG, HDL, CHOLHDL, LDLCALC, LDLDIRECT  Wt Readings from Last 3 Encounters:  06/27/19 175 lb (79.4 kg)  05/09/19 175 lb (79.4 kg)  12/24/18 179 lb 3.2 oz (81.3 kg)     Objective:    Vital Signs:  BP (!) 134/53   Pulse 60   Wt 175 lb (79.4 kg)   BMI 31.00 kg/m    VITAL SIGNS:  reviewed GEN:  no acute distress RESPIRATORY:  normal respiratory effort, symmetric expansion NEURO:  alert and oriented x 3, no obvious focal deficit PSYCH:  normal affect  ASSESSMENT & PLAN:     Hypertension  - norvasc 5 mg, coreg 25 mg BID, and lasix 40 mg every other day - looks like she is also taking losartan 100 mg daily - pressures are controlled - will obtain BMP from primary   Chronic diastolic heart failure - euvolemic  - controlled on 40 mg lasix   OSA on CPAP - compliant on CPAP   Hyperlipidemia with goal LDL < 70 - continue crestor 10 mg and zetia Last drawn on 05/28/19 by PCP. I can't see these in [place]. Will ask for a copy from PCP.    DM2 - per PCP - A1c 6.0% 05/28/2019   ESRD - fistula in place last year and healed well - no HD yet   Gout - she is  controlling with diet and allopurinol - sounds like she is tolerating lasix well   COVID-19 Education: The signs and symptoms of COVID-19 were discussed with the patient and how to seek care for testing (follow up with PCP or arrange E-visit).  The importance of social distancing was discussed today.  Time:   Today, I have spent 16 minutes with the patient with telehealth technology discussing the above problems.     Medication Adjustments/Labs and Tests Ordered: Current medicines are reviewed at length with the patient today.  Concerns regarding medicines are outlined above.   Tests Ordered: No  orders of the defined types were placed in this encounter.   Medication Changes: No orders of the defined types were placed in this encounter.   Follow Up:  Virtual Visit or In Person in 2 month(s)  Signed, Ledora Bottcher, [place]  06/27/2019 10:24 AM    [place]

## 2019-06-26 NOTE — Telephone Encounter (Signed)
I called pt, there was no answer and no voicemail.

## 2019-06-27 ENCOUNTER — Telehealth: Payer: Self-pay

## 2019-06-27 ENCOUNTER — Telehealth (INDEPENDENT_AMBULATORY_CARE_PROVIDER_SITE_OTHER): Payer: Medicare Other | Admitting: Physician Assistant

## 2019-06-27 VITALS — BP 134/53 | HR 60 | Wt 175.0 lb

## 2019-06-27 DIAGNOSIS — E785 Hyperlipidemia, unspecified: Secondary | ICD-10-CM

## 2019-06-27 DIAGNOSIS — G4733 Obstructive sleep apnea (adult) (pediatric): Secondary | ICD-10-CM

## 2019-06-27 DIAGNOSIS — I5032 Chronic diastolic (congestive) heart failure: Secondary | ICD-10-CM

## 2019-06-27 DIAGNOSIS — N186 End stage renal disease: Secondary | ICD-10-CM

## 2019-06-27 DIAGNOSIS — I10 Essential (primary) hypertension: Secondary | ICD-10-CM

## 2019-06-27 DIAGNOSIS — Z9989 Dependence on other enabling machines and devices: Secondary | ICD-10-CM

## 2019-06-27 DIAGNOSIS — E1121 Type 2 diabetes mellitus with diabetic nephropathy: Secondary | ICD-10-CM

## 2019-06-27 NOTE — Telephone Encounter (Signed)
Patient and/or DPR-approved person aware of 7/9 AVS instructions and verbalized understanding.  Letter including After Visit Summary and any other necessary documents to be mailed to the patient's address on file.

## 2019-06-27 NOTE — Patient Instructions (Addendum)
Medication Instructions:  Your physician recommends that you continue on your current medications as directed. Please refer to the Current Medication list given to you today.  If you need a refill on your cardiac medications before your next appointment, please call your pharmacy.   Lab work: NONE If you have labs (blood work) drawn today and your tests are completely normal, you will receive your results only by: Marland Kitchen MyChart Message (if you have MyChart) OR . A paper copy in the mail If you have any lab test that is abnormal or we need to change your treatment, we will call you to review the results.  Testing/Procedures: NONE  Follow-Up: At [facility], you and your health needs are our priority.  As part of our continuing mission to provide you with exceptional heart care, we have created designated Provider Care Teams.  These Care Teams include your primary Cardiologist (physician) and Advanced Practice Providers (APPs -  Physician Assistants and Nurse Practitioners) who all work together to provide you with the care you need, when you need it. You have an upcoming follow up appointment on 09/03/2019 at 8:20 AM with Dr. Shelva Majestic.

## 2019-09-03 ENCOUNTER — Other Ambulatory Visit: Payer: Self-pay

## 2019-09-03 ENCOUNTER — Encounter: Payer: Self-pay | Admitting: Cardiovascular Disease

## 2019-09-03 ENCOUNTER — Ambulatory Visit (INDEPENDENT_AMBULATORY_CARE_PROVIDER_SITE_OTHER): Payer: Medicare Other | Admitting: Cardiovascular Disease

## 2019-09-03 VITALS — BP 122/62 | HR 69 | Ht 63.0 in | Wt 177.0 lb

## 2019-09-03 DIAGNOSIS — G4733 Obstructive sleep apnea (adult) (pediatric): Secondary | ICD-10-CM

## 2019-09-03 DIAGNOSIS — E1121 Type 2 diabetes mellitus with diabetic nephropathy: Secondary | ICD-10-CM | POA: Diagnosis not present

## 2019-09-03 DIAGNOSIS — I10 Essential (primary) hypertension: Secondary | ICD-10-CM

## 2019-09-03 DIAGNOSIS — E785 Hyperlipidemia, unspecified: Secondary | ICD-10-CM | POA: Diagnosis not present

## 2019-09-03 DIAGNOSIS — N186 End stage renal disease: Secondary | ICD-10-CM

## 2019-09-03 NOTE — Patient Instructions (Signed)
Follow-Up: You will need a follow up appointment in 12 months.  Please call our office 2 months in advance, July 2021 to schedule this, September 2021 appointment.  You may see Thomas Kelly, MD or one of the following Advanced Practice Providers on your designated Care Team:  Hao Meng, PA-C  Angela Duke, PA-C      Medication Instructions:  The current medical regimen is effective;  continue present plan and medications as directed. Please refer to the Current Medication list given to you today. If you need a refill on your cardiac medications before your next appointment, please call your pharmacy. Labwork: When you have labs (blood work) and your tests are completely normal, you will receive your results ONLY by MyChart Message (if you have MyChart) -OR- A paper copy in the mail.  At [facility], you and your health needs are our priority.  As part of our continuing mission to provide you with exceptional heart care, we have created designated Provider Care Teams.  These Care Teams include your primary Cardiologist (physician) and Advanced Practice Providers (APPs -  Physician Assistants and Nurse Practitioners) who all work together to provide you with the care you need, when you need it.  Thank you for choosing [facility]!!       

## 2019-09-03 NOTE — Progress Notes (Signed)
Patient ID: Tara Holmes, female   DOB: Oct 06, 1946, 73 y.o.   MRN: [id number]     Primary M.D.: Dr. Quillian Quince Pomponsini  HPI: Tara Holmes is a 73 y.o. female who presents to the office today for a 13 month follow-up cardiology/sleep evaluation.  Tara Holmes has a long history of hypertension, family history for diabetes mellitus, hyperlipidemia, obesity, and is status post clipping of remote left brain aneurysm. Tara Holmes has a history of obstructive sleep apnea which was originally diagnosed over 10 years ago. This study had been done in [place].  Tara Holmes was able to obtain a new CPAP machine and now has a ResMed air since 10.  Tara Holmes.  Typically Tara Holmes goes to bed between 9 and 9:30 PM.  Tara Holmes believes Tara Holmes sleep is restorative.  Tara Holmes occasionally takes a daytime nap.  Tara Holmes has a history of hyperlipidemia for which Tara Holmes's been on Mevacor 40 mg, and fish oil capsules.  Tara Holmes is no longer taking WelChol.  Tara Holmes blood pressure has been treated with losartan 100 mg, amlodipine 10 mg, carvedilol 25 mg twice a day and furosemide 40 mg. Tara Holmes is diabetic and now is on glimepiride and Tradjenta.  Tara Holmes was  evaluated at [facility] emergency room for chest pain on 04/02/2017.  Tara Holmes chest pain was nonexertional and not described as a tightness.  It was felt to be somewhat atypical.  Tara Holmes saw Tara Holmes primary physician in follow-up the following day.  Tara Holmes pain was described as sharp, which occurred in different locations on Tara Holmes chest nor not associated with radiation to Tara Holmes arms or jaw.  It would last 5 minutes and then dissipate spontaneously.    Since I saw Tara Holmes, Tara Holmes also has been evaluated at [facility] for chronic kidney disease stage IV.  There has been discussion for possible AV fistula formation in the very near future.  At Tara Holmes last evaluation.  Tara Holmes serum creatinine was 3.6.  Tara Holmes had seen Dr. Lissa Merlin and recently saw Dr. Adonis Housekeeper.   I  saw Tara Holmes in April 2018 in follow-up of Tara Holmes  ER evaluation.  I scheduled Tara Holmes for [facility] study which was done on 04/19/2017.  This revealed normal perfusion without scar or ischemia.  Ejection fraction was 55%.  I reviewed Tara Holmes most recent echo which showed LVH with normal systolic function with grade 2 diastolic dysfunction, aortic valve sclerosis without stenosis, and mitral annular calcification with trivial PR.  Tara Holmes has continued to use Tara Holmes CPAP.  Tara Holmes admits to 100% Holmes.  I saw Tara Holmes in July 2018.  Tara Holmes has developed progressive chronic kidney disease and had undergone insertion of an AV fistula in Tara Holmes left arm bands by Dr. Evelina Bucy.  Has been on amlodipine 5 mg, low 5 mg twice a day, furosemide 40 mg daily but pressure control.  Tara Holmes is diabetic Tara Holmes is on gloimeperide  but is no longer taking Tradjenta.  Tara Holmes used to use CPAP and Tara Holmes DME company is [employer] in [place].  A download obtained in the office today from April 18 through May 04, 2018 demonstrated continued Holmes and Tara Holmes was only sleeping 6 hours and 23 minutes with CPAP on a daily basis.  At a auto set up Tara Holmes 95th percentile pressure was16.8 with a maximum average pressure of 18.3.  Tara Holmes was using a fullface mask.  AHI was excellent at 1.3.   Over the past year, Tara Holmes renal function has stabilized and Tara Holmes has not required initiation of dialysis therapy.  Tara Holmes is followed by Dr. Osker Mason.  Tara Holmes admits that Tara Holmes is compliant with CPAP therapy but often puts Tara Holmes hair is in curlers approximately every 7 to 10 days and therefore has not been sleeping with the CPAP mask when Tara Holmes hairs are in curlers.  A download was obtained in the office today from August 13 through August 30, 2019.  Tara Holmes was compliant for 26 of 30 days with usage in the 4 days which Tara Holmes did not use therapy was when Tara Holmes had curlers in Tara Holmes hair at night.  Tara Holmes was averaging 8 hours and 21 minutes.  AHI is excellent at 1.3 and Tara Holmes CPAP auto settings range from 11 to 20 cm.  Tara Holmes 95th percentile pressure is 16.6 with a  maximum average pressure of 18.4.  Tara Holmes uses a ResMed full facemask.  An Epworth Sleepiness Scale score was recalculated today and this endorsed at 3 arguing against daytime sleepiness. Tara Holmes presents for reevaluation.  Past Medical History:  Diagnosis Date  . Anemia   . Aneurysm ([place]) 1978  . Chronic kidney disease    ESRD  . Diabetes ([place])   . GERD (gastroesophageal reflux disease)   . Hypertension   . Sleep apnea     Past Surgical History:  Procedure Laterality Date  . AV FISTULA INSERTION W/ RF MAGNETIC GUIDANCE Left 08/08/2018   Procedure: AV FISTULA INSERTION W/RF MAGNETIC GUIDANCE;  Surgeon: Katha Cabal, MD;  Location: [facility];  Service: Cardiovascular;  Laterality: Left;  . BREAST SURGERY Left    fibroid  . TONSILLECTOMY      Allergies  Allergen Reactions  . Amoxicillin Other (See Comments)    Pt states allergic reaction in vaginal region. Has patient had a PCN reaction causing immediate rash, facial/tongue/throat swelling, SOB or lightheadedness with hypotension: No Has patient had a PCN reaction causing severe rash involving mucus membranes or skin necrosis: No Has patient had a PCN reaction that required hospitalization: No Has patient had a PCN reaction occurring within the last 10 years: No Yeast infection reaction ONLY If all of the above answers are "NO", then may proceed with Ce  . Atorvastatin Other (See Comments)    MUSCLE ACHES   . Prednisone Other (See Comments)    ELEVATED BLOOD SUGAR  . Contrast Media [Iodinated Diagnostic Agents] Other (See Comments)    PATIENT IS UNAWARE OF THIS ALLERGY OR ANY REACTION TO THIS MEDICATION.    Current Outpatient Medications  Medication Sig Dispense Refill  . ACCU-CHEK COMPACT PLUS test strip CHECK BLOOD SUGAR ONCE A DAY  3  . allopurinol (ZYLOPRIM) 100 MG tablet allopurinol 100 mg tablet  Take 1 tablet every day by oral route.    Marland Kitchen amLODipine (NORVASC) 10 MG tablet Take 5 mg by mouth every morning.      . carvedilol (COREG) 25 MG tablet Take 25 mg by mouth 2 (two) times daily.     Marland Kitchen ezetimibe (ZETIA) 10 MG tablet Take 10 mg by mouth daily.    . famotidine (PEPCID) 10 MG tablet Take by mouth daily.     . ferrous sulfate 325 (65 FE) MG EC tablet Take 325 mg by mouth every evening.   0  . furosemide (LASIX) 40 MG tablet Take 40 mg by mouth every other day.     Marland Kitchen glimepiride (AMARYL) 2 MG tablet Take 1 mg by mouth daily as needed (elevated blood sugar).     Marland Kitchen linagliptin (TRADJENTA) 5 MG TABS tablet Take 5 mg  by mouth daily as needed (for elevated blood sugar).     Marland Kitchen losartan (COZAAR) 100 MG tablet Take 100 mg by mouth daily.    Marland Kitchen omega-3 acid ethyl esters (LOVAZA) 1 g capsule Take 1 g by mouth daily.    Marland Kitchen oxybutynin (DITROPAN) 5 MG tablet Take 5 mg by mouth 2 (two) times daily.     Marland Kitchen oxyCODONE-acetaminophen (PERCOCET) 5-325 MG tablet Take 1 tablet by mouth every 4 (four) hours as needed. 15 tablet 0  . rosuvastatin (CRESTOR) 10 MG tablet Take 10 mg by mouth every evening.   3   No current facility-administered medications for this visit.     Social History   Socioeconomic History  . Marital status: Married    Spouse name: Percell Miller  . Number of children: 2  . Years of education: MA  . Highest education level: Not on file  Occupational History  . Occupation: Retired  Scientific laboratory technician  . Financial resource strain: Not on file  . Food insecurity    Worry: Not on file    Inability: Not on file  . Transportation needs    Medical: Not on file    Non-medical: Not on file  Tobacco Use  . Smoking status: Never Smoker  . Smokeless tobacco: Never Used  Substance and Sexual Activity  . Alcohol use: No  . Drug use: Never  . Sexual activity: Not on file  Lifestyle  . Physical activity    Days per week: Not on file    Minutes per session: Not on file  . Stress: Not on file  Relationships  . Social Herbalist on phone: Not on file    Gets together: Not on file    Attends  religious service: Not on file    Active member of club or organization: Not on file    Attends meetings of clubs or organizations: Not on file    Relationship status: Not on file  . Intimate partner violence    Fear of current or ex partner: Not on file    Emotionally abused: Not on file    Physically abused: Not on file    Forced sexual activity: Not on file  Other Topics Concern  . Not on file  Social History Narrative   Patient lives at home with spouse.   Caffeine Use: none   Additional social history is notable in that Tara Holmes works as a Therapist, sports.  There is no tobacco, alcohol.  Family History  Problem Relation Age of Onset  . Heart attack Mother   . Diabetes Mother   . Colon cancer Father     ROS General: Negative; No fevers, chills, or night sweats;  HEENT: Negative; No changes in vision or hearing, sinus congestion, difficulty swallowing Pulmonary: Negative; No cough, wheezing, shortness of breath, hemoptysis Cardiovascular: Negative; No chest pain, presyncope, syncope, palpitations  left arm AV fistula GI: Negative; No nausea, vomiting, diarrhea, or abdominal pain GU: Negative; No dysuria, hematuria, or difficulty voiding Musculoskeletal: Negative; no myalgias, joint pain, or weakness Hematologic/Oncology: Negative; no easy bruising, bleeding Endocrine: Negative; no heat/cold intolerance; no diabetes Neuro: Negative; no changes in balance, headaches Skin: Negative; No rashes or skin lesions Psychiatric: Negative; No behavioral problems, depression Sleep: Positive for obstructive sleep apnea on CPAP therapy and Tara Holmes admits to 100% Holmes.  No breakthrough snoring, daytime sleepiness, hypersomnolence, bruxism, restless legs, hypnogognic hallucinations, no cataplexy Other comprehensive 14 point system review is negative.   PE BP 122/62  Pulse 69   Ht '5\' 3"'  (1.6 m)   Wt 177 lb (80.3 kg)   BMI 31.35 kg/m    Repeat blood pressure by me was 122/70   Wt Readings from Last 3 Encounters:  09/03/19 177 lb (80.3 kg)  06/27/19 175 lb (79.4 kg)  05/09/19 175 lb (79.4 kg)    General: Alert, oriented, no distress.  Skin: normal turgor, no rashes, warm and dry HEENT: Normocephalic, atraumatic. Pupils equal round and reactive to light; sclera anicteric; extraocular muscles intact;  Nose without nasal septal hypertrophy Mouth/Parynx benign; Mallinpatti scale 3 Neck: No JVD, no carotid bruits; normal carotid upstroke Lungs: clear to ausculatation and percussion; no wheezing or rales Chest wall: without tenderness to palpitation Heart: PMI not displaced, RRR, s1 s2 normal, 1/6 systolic murmur, no diastolic murmur, no rubs, gallops, thrills, or heaves Abdomen: soft, nontender; no hepatosplenomehaly, BS+; abdominal aorta nontender and not dilated by palpation. Back: no CVA tenderness Pulses 2+; left arm AV fistula Musculoskeletal: full range of motion, normal strength, no joint deformities Extremities: no clubbing cyanosis or edema, Homan's sign negative  Neurologic: grossly nonfocal; Cranial nerves grossly wnl Psychologic: Normal mood and affect   ECG (independently read by me): Normal sinus rhythm at 69 bpm.  Mild T wave abnormality in aVL.  No ectopy.  ECG of 08/01/2018 was reviewed independently by me and shows sinus bradycardia 55 bpm.  There is mild T wave abnormality in leads I and aVL.  July 2018 ECG (independently read by me): Normal sinus rhythm at 61 bpm.  T-wave abnormality in leads 1 and aVL  April 2018 ECG (independently read by me): Normal sinus rhythm at 67 bpm.  Resolution of prior inferolateral T wave inversion, however, there is new T-wave inversion in leads 1 and aVL.  May 2017 ECG (independently read by me): Normal sinus rhythm at 66 bpm.  Inferolateral T wave abnormality.  April 2015 ECG (independently read by me): Normal sinus rhythm 81 beats per minute.  Nonspecific ST changes in leads 1, AVL and V6.   12/31/2013 ECG  (independently read by me): Sinus rhythm. Nondiagnostic T changes.  LABS: BMP Latest Ref Rng & Units 11/25/2018 08/01/2018  Glucose 70 - 99 mg/dL 276(H) 137(H)  BUN 8 - 23 mg/dL 46(H) 54(H)  Creatinine 0.44 - 1.00 mg/dL 3.01(H) 2.84(H)  Sodium 135 - 145 mmol/L 134(L) 142  Potassium 3.5 - 5.1 mmol/L 3.9 4.1  Chloride 98 - 111 mmol/L 99 106  CO2 22 - 32 mmol/L 24 29  Calcium 8.9 - 10.3 mg/dL 9.6 9.6    Hepatic Function Latest Ref Rng & Units 11/25/2018  Total Protein 6.5 - 8.1 g/dL 7.5  Albumin 3.5 - 5.0 g/dL 3.0(L)  AST 15 - 41 U/L 14(L)  ALT 0 - 44 U/L 19  Alk Phosphatase 38 - 126 U/L 61  Total Bilirubin 0.3 - 1.2 mg/dL 1.5(H)   CBC Latest Ref Rng & Units 11/25/2018 08/01/2018  WBC 4.0 - 10.5 K/uL 13.2(H) 6.6  Hemoglobin 12.0 - 15.0 g/dL 10.3(L) 10.3(L)  Hematocrit 36.0 - 46.0 % 33.8(L) 30.1(L)  Platelets 150 - 400 K/uL 220 215    BNP No results found for: PROBNP  Lipid Panel  No results found for: CHOL   RADIOLOGY: No results found.  IMPRESSION:  1. OSA (obstructive sleep apnea)   2. Essential hypertension   3. Hyperlipidemia with target LDL less than 70   4. Type 2 diabetes mellitus with diabetic nephropathy, without long-term current use of insulin ([place])  5. ESRD (end stage renal disease) ([place])     ASSESSMENT AND PLAN: Tara Holmes is a 73 year old African-American female who has a history of diabetes mellitus, hyperlipidemia, hypertension, obstructive sleep apnea has developed progressive renal insufficiency.  Tara Holmes underwent insertion of an AV fistula in August 2019 but fortunately has not not had to initiate dialysis therapy.  Tara Holmes blood pressure today is stable on Tara Holmes current regimen of amlodipine 10 5 mg, carvedilol 25 mg twice a day, furosemide 40 mg every other day and losartan 100 mg daily.  Tara Holmes continues to be on Zetia 10 mg and rosuvastatin 10 mg for hyperlipidemia in addition to [place].  Tara Holmes is diabetic on Tradjenta, glimepiride.  With reference to Tara Holmes  obstructive sleep apnea, Tara Holmes continues to meet Holmes standards.  However, on the evening that Tara Holmes puts curlers in Tara Holmes hair Tara Holmes has not been using Tara Holmes CPAP therapy.  I have suggested that Tara Holmes use CPAP on all nights and consider potentially having Tara Holmes put Tara Holmes curlers in Tara Holmes here in the daytime rather than in the evening.  Tara Holmes AHI is excellent at 1.3 and Tara Holmes does not have any significant leak using a ResMed full facemask.  Tara Holmes is mildly obese with a BMI of 31.35 and increased exercise was recommended.  Tara Holmes continues to be followed by Dr. Franchot Gallo as well as Leana Roe.  I will see Tara Holmes in 1 year for follow-up evaluation.  Time spent: 25 minutes  Troy Sine, MD, [facility]  09/05/2019 4:43 PM

## 2019-09-05 ENCOUNTER — Encounter: Payer: Self-pay | Admitting: Cardiovascular Disease

## 2019-09-09 ENCOUNTER — Other Ambulatory Visit (INDEPENDENT_AMBULATORY_CARE_PROVIDER_SITE_OTHER): Payer: Medicare Other

## 2019-09-09 DIAGNOSIS — G4733 Obstructive sleep apnea (adult) (pediatric): Secondary | ICD-10-CM

## 2019-09-09 DIAGNOSIS — I10 Essential (primary) hypertension: Secondary | ICD-10-CM

## 2019-09-09 DIAGNOSIS — E785 Hyperlipidemia, unspecified: Secondary | ICD-10-CM

## 2019-11-26 ENCOUNTER — Other Ambulatory Visit: Payer: Self-pay

## 2019-11-26 ENCOUNTER — Ambulatory Visit (INDEPENDENT_AMBULATORY_CARE_PROVIDER_SITE_OTHER): Payer: Medicare Other | Admitting: Cardiovascular Disease

## 2019-11-26 ENCOUNTER — Encounter: Payer: Self-pay | Admitting: Cardiovascular Disease

## 2019-11-26 VITALS — BP 120/67 | HR 63 | Temp 97.7°F | Ht 63.0 in | Wt 181.6 lb

## 2019-11-26 DIAGNOSIS — E785 Hyperlipidemia, unspecified: Secondary | ICD-10-CM | POA: Diagnosis not present

## 2019-11-26 DIAGNOSIS — E1121 Type 2 diabetes mellitus with diabetic nephropathy: Secondary | ICD-10-CM | POA: Diagnosis not present

## 2019-11-26 DIAGNOSIS — G4733 Obstructive sleep apnea (adult) (pediatric): Secondary | ICD-10-CM

## 2019-11-26 DIAGNOSIS — I10 Essential (primary) hypertension: Secondary | ICD-10-CM | POA: Diagnosis not present

## 2019-11-26 DIAGNOSIS — Z9989 Dependence on other enabling machines and devices: Secondary | ICD-10-CM

## 2019-11-26 DIAGNOSIS — N184 Chronic kidney disease, stage 4 (severe): Secondary | ICD-10-CM

## 2019-11-26 NOTE — Progress Notes (Signed)
Patient ID: Tara Holmes, female   DOB: 08-14-1946, 73 y.o.   MRN: [id number]     Primary M.D.: Dr. Quillian Quince Pomponsini  HPI: Tara Holmes is a 73 y.o. female who presents to the office today for a 13 month follow-up cardiology/sleep evaluation.  [name] has a long history of hypertension, family history for diabetes mellitus, hyperlipidemia, obesity, and is status post clipping of remote left brain aneurysm. She has a history of obstructive sleep apnea which was originally diagnosed over 10 years ago. This study had been done in [place].  She was able to obtain a new CPAP machine and now has a ResMed air since 10.  Tara Holmes to using CPAP with 100% compliance.  Typically she goes to bed between 9 and 9:30 PM.  She believes her sleep is restorative.  She occasionally takes a daytime nap.  She has a history of hyperlipidemia for which she's been on Mevacor 40 mg, and fish oil capsules.  She is no longer taking WelChol.  Her blood pressure has been treated with losartan 100 mg, amlodipine 10 mg, carvedilol 25 mg twice a day and furosemide 40 mg. She is diabetic and now is on glimepiride and Tradjenta.  She was  evaluated at [facility] emergency room for chest pain on 04/02/2017.  Her chest pain was nonexertional and not described as a tightness.  It was felt to be somewhat atypical.  She saw her primary physician in follow-up the following day.  Her pain was described as sharp, which occurred in different locations on her chest nor not associated with radiation to her arms or jaw.  It would last 5 minutes and then dissipate spontaneously.    Since I saw her, she also has been evaluated at [facility] for chronic kidney disease stage IV.  There has been discussion for possible AV fistula formation in the very near future.  At her last evaluation.  Her serum creatinine was 3.6.  She had seen Dr. Lissa Merlin and recently saw Dr. Adonis Housekeeper.   I  saw her in April 2018 in follow-up of her  ER evaluation.  I scheduled her for [facility] study which was done on 04/19/2017.  This revealed normal perfusion without scar or ischemia.  Ejection fraction was 55%.  I reviewed her most recent echo which showed LVH with normal systolic function with grade 2 diastolic dysfunction, aortic valve sclerosis without stenosis, and mitral annular calcification with trivial PR.  She has continued to use her CPAP.  She admits to 100% compliance.  I saw her in July 2018.  She has developed progressive chronic kidney disease and had undergone insertion of an AV fistula in her left arm bands by Dr. Evelina Bucy.  Has been on amlodipine 5 mg, low 5 mg twice a day, furosemide 40 mg daily but pressure control.  She is diabetic he is on gloimeperide  but is no longer taking Tradjenta.  He used to use CPAP and her DME company is [employer] in [place].  A download obtained in the office today from April 18 through May 04, 2018 demonstrated continued compliance and she was only sleeping 6 hours and 23 minutes with CPAP on a daily basis.  At a auto set up her 95th percentile pressure was16.8 with a maximum average pressure of 18.3.  She was using a fullface mask.  AHI was excellent at 1.3.   I last saw her on September 03, 2019.  Over the prior year, her renal function had stabilized and  she has not required initiation of dialysis.  She is followed by Dr. Osker Mason.Marland Kitchen She was compliant with CPAP therapy but often puts her hair is in curlers approximately every 7 to 10 days and therefore has not been sleeping with the CPAP mask when her hairs are in curlers.  A download was obtained in the office from August 13 through August 30, 2019.  She was compliant for 26 of 30 days with usage in the 4 days which she did not use therapy was when she had curlers in her hair at night.  She was averaging 8 hours and 21 minutes.  AHI is excellent at 1.3 and her CPAP auto settings range from 11 to 20 cm.  Her 95th percentile pressure is 16.6  with a maximum average pressure of 18.4.  She uses a ResMed full facemask.  An Epworth Sleepiness Scale score was recalculated  and this endorsed at 3 arguing against daytime sleepiness.   Since I last saw her, she apparently went to the emergency room on October 28, 2019 and was hospitalized overnight at [facility] for atypical chest pain.  Chest x-ray, EKG, serial troponins are unremarkable and she was discharged the following day.  She was concerned about this evaluation and wanted my opinion regarding her assessment.  It was felt most likely her chest pain was musculoskeletal in etiology.  Presently, she has improved compliance with her CPAP.  A new download was obtained from November 7 through November 24, 2019 which showed 90% compliance, averaging 7 hours and 55 minutes.  Her CPAP auto was set at a minimum pressure of 11 and maximum of 20, and her 95th percentile pressure was 16.6 with a maximum average pressure of 18.6.  She is sleeping well.  She presents for reevaluation.  Past Medical History:  Diagnosis Date  . Anemia   . Aneurysm ([place]) 1978  . Chronic kidney disease    ESRD  . Diabetes ([place])   . GERD (gastroesophageal reflux disease)   . Hypertension   . Sleep apnea     Past Surgical History:  Procedure Laterality Date  . AV FISTULA INSERTION W/ RF MAGNETIC GUIDANCE Left 08/08/2018   Procedure: AV FISTULA INSERTION W/RF MAGNETIC GUIDANCE;  Surgeon: Katha Cabal, MD;  Location: [facility];  Service: Cardiovascular;  Laterality: Left;  . BREAST SURGERY Left    fibroid  . TONSILLECTOMY      Allergies  Allergen Reactions  . Amoxicillin Other (See Comments)    Pt states allergic reaction in vaginal region. Has patient had a PCN reaction causing immediate rash, facial/tongue/throat swelling, SOB or lightheadedness with hypotension: No Has patient had a PCN reaction causing severe rash involving mucus membranes or skin necrosis: No Has patient  had a PCN reaction that required hospitalization: No Has patient had a PCN reaction occurring within the last 10 years: No Yeast infection reaction ONLY If all of the above answers are "NO", then may proceed with Ce  . Atorvastatin Other (See Comments)    MUSCLE ACHES   . Prednisone Other (See Comments)    ELEVATED BLOOD SUGAR  . Contrast Media [Iodinated Diagnostic Agents] Other (See Comments)    PATIENT IS UNAWARE OF THIS ALLERGY OR ANY REACTION TO THIS MEDICATION.    Current Outpatient Medications  Medication Sig Dispense Refill  . ACCU-CHEK COMPACT PLUS test strip CHECK BLOOD SUGAR ONCE A DAY  3  . acyclovir (ZOVIRAX) 200 MG capsule Take 1 capsule 5 times a  day by oral route for 7 days.    Marland Kitchen allopurinol (ZYLOPRIM) 100 MG tablet allopurinol 100 mg tablet  Take 1 tablet every day by oral route.    Marland Kitchen amLODipine (NORVASC) 10 MG tablet Take 5 mg by mouth every morning.     . carvedilol (COREG) 25 MG tablet Take 25 mg by mouth 2 (two) times daily.     . cetirizine (ZYRTEC) 10 MG tablet Take by mouth.    . ezetimibe (ZETIA) 10 MG tablet Take 10 mg by mouth daily.    . famotidine (PEPCID) 10 MG tablet Take by mouth daily.     . ferrous sulfate 325 (65 FE) MG EC tablet Take 325 mg by mouth every evening.   0  . furosemide (LASIX) 40 MG tablet Take 40 mg by mouth every other day.     Marland Kitchen glimepiride (AMARYL) 2 MG tablet Take 1 mg by mouth daily as needed (elevated blood sugar).     Marland Kitchen linagliptin (TRADJENTA) 5 MG TABS tablet Take 5 mg by mouth daily as needed (for elevated blood sugar).     Marland Kitchen losartan (COZAAR) 100 MG tablet Take 100 mg by mouth daily.    Marland Kitchen omega-3 acid ethyl esters (LOVAZA) 1 g capsule Take 1 g by mouth daily.    Marland Kitchen oxybutynin (DITROPAN) 5 MG tablet Take 5 mg by mouth 2 (two) times daily.     . rosuvastatin (CRESTOR) 10 MG tablet Take 10 mg by mouth every evening.   3   No current facility-administered medications for this visit.    Social History   Socioeconomic History   . Marital status: Married    Spouse name: Percell Miller  . Number of children: 2  . Years of education: MA  . Highest education level: Not on file  Occupational History  . Occupation: Retired  Tobacco Use  . Smoking status: Never Smoker  . Smokeless tobacco: Never Used  Substance and Sexual Activity  . Alcohol use: No  . Drug use: Never  . Sexual activity: Not on file  Other Topics Concern  . Not on file  Social History Narrative   Patient lives at home with spouse.   Caffeine Use: none   Social Determinants of Health   Financial Resource Strain:   . Difficulty of Paying Living Expenses: Not on file  Food Insecurity:   . Worried About Charity fundraiser in the Last Year: Not on file  . Ran Out of Food in the Last Year: Not on file  Transportation Needs:   . Lack of Transportation (Medical): Not on file  . Lack of Transportation (Non-Medical): Not on file  Physical Activity:   . Days of Exercise per Week: Not on file  . Minutes of Exercise per Session: Not on file  Stress:   . Feeling of Stress : Not on file  Social Connections:   . Frequency of Communication with Friends and Family: Not on file  . Frequency of Social Gatherings with Friends and Family: Not on file  . Attends Religious Services: Not on file  . Active Member of Clubs or Organizations: Not on file  . Attends Archivist Meetings: Not on file  . Marital Status: Not on file  Intimate Partner Violence:   . Fear of Current or Ex-Partner: Not on file  . Emotionally Abused: Not on file  . Physically Abused: Not on file  . 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  There is no tobacco, alcohol.  Family History  Problem Relation Age of Onset  . Heart attack Mother   . Diabetes Mother   . Colon cancer Father     ROS General: Negative; No fevers, chills, or night sweats;  HEENT: Negative; No changes in vision or hearing, sinus  congestion, difficulty swallowing Pulmonary: Negative; No cough, wheezing, shortness of breath, hemoptysis Cardiovascular: Negative; No chest pain, presyncope, syncope, palpitations  left arm AV fistula GI: Negative; No nausea, vomiting, diarrhea, or abdominal pain GU: Negative; No dysuria, hematuria, or difficulty voiding Musculoskeletal: Negative; no myalgias, joint pain, or weakness Hematologic/Oncology: Negative; no easy bruising, bleeding Endocrine: Negative; no heat/cold intolerance; no diabetes Neuro: Negative; no changes in balance, headaches Skin: Negative; No rashes or skin lesions Psychiatric: Negative; No behavioral problems, depression Sleep: Positive for obstructive sleep apnea on CPAP therapy and she admits to 100% compliance.  No breakthrough snoring, daytime sleepiness, hypersomnolence, bruxism, restless legs, hypnogognic hallucinations, no cataplexy Other comprehensive 14 point system review is negative.   PE BP 120/67   Pulse 63   Temp 97.7 F (36.5 C)   Ht _0  (1.6 m)   Wt 181 lb 9.6 oz (82.4 kg)   SpO2 97%   BMI 32.17 kg/m    Repeat blood pressure by me was 120/70  Wt Readings from Last 3 Encounters:  11/26/19 181 lb 9.6 oz (82.4 kg)  09/03/19 177 lb (80.3 kg)  06/27/19 175 lb (79.4 kg)    General: Alert, oriented, no distress.  Skin: normal turgor, no rashes, warm and dry HEENT: Normocephalic, atraumatic. Pupils equal round and reactive to light; sclera anicteric; extraocular muscles intact;  Nose without nasal septal hypertrophy Mouth/Parynx benign; Mallinpatti scale 3 Neck: No JVD, no carotid bruits; normal carotid upstroke Lungs: clear to ausculatation and percussion; no wheezing or rales Chest wall: Definite left costochondral chest wall tenderness  to palpitation Heart: PMI not displaced, RRR, s1 s2 normal, 1/6 systolic murmur, no diastolic murmur, no rubs, gallops, thrills, or heaves Abdomen: soft, nontender; no hepatosplenomehaly, BS+;  abdominal aorta nontender and not dilated by palpation. Back: no CVA tenderness Pulses 2+; left arm AV fistula Musculoskeletal: full range of motion, normal strength, no joint deformities Extremities: no clubbing cyanosis or edema, Homan's sign negative  Neurologic: grossly nonfocal; Cranial nerves grossly wnl Psychologic: Normal mood and affect  ECG (independently read by me): Normal sinus rhythm at 63 bpm with sinus arrhythmia.  Normal intervals.  September 03, 2019 ECG (independently read by me): Normal sinus rhythm at 69 bpm.  Mild T wave abnormality in aVL.  No ectopy.  ECG of 08/01/2018 was reviewed independently by me and shows sinus bradycardia 55 bpm.  There is mild T wave abnormality in leads I and aVL.  July 2018 ECG (independently read by me): Normal sinus rhythm at 61 bpm.  T-wave abnormality in leads 1 and aVL  April 2018 ECG (independently read by me): Normal sinus rhythm at 67 bpm.  Resolution of prior inferolateral T wave inversion, however, there is new T-wave inversion in leads 1 and aVL.  May 2017 ECG (independently read by me): Normal sinus rhythm at 66 bpm.  Inferolateral T wave abnormality.  April 2015 ECG (independently read by me): Normal sinus rhythm 81 beats per minute.  Nonspecific ST changes in leads 1, AVL and V6.   12/31/2013 ECG (independently read by me): Sinus rhythm. Nondiagnostic T changes.  LABS: BMP Latest Ref Rng & Units  11/25/2018 08/01/2018  Glucose 70 - 99 mg/dL 276(H) 137(H)  BUN 8 - 23 mg/dL 46(H) 54(H)  Creatinine 0.44 - 1.00 mg/dL 3.01(H) 2.84(H)  Sodium 135 - 145 mmol/L 134(L) 142  Potassium 3.5 - 5.1 mmol/L 3.9 4.1  Chloride 98 - 111 mmol/L 99 106  CO2 22 - 32 mmol/L 24 29  Calcium 8.9 - 10.3 mg/dL 9.6 9.6    Hepatic Function Latest Ref Rng & Units 11/25/2018  Total Protein 6.5 - 8.1 g/dL 7.5  Albumin 3.5 - 5.0 g/dL 3.0(L)  AST 15 - 41 U/L 14(L)  ALT 0 - 44 U/L 19  Alk Phosphatase 38 - 126 U/L 61  Total Bilirubin 0.3 - 1.2 mg/dL  1.5(H)   CBC Latest Ref Rng & Units 11/25/2018 08/01/2018  WBC 4.0 - 10.5 K/uL 13.2(H) 6.6  Hemoglobin 12.0 - 15.0 g/dL 10.3(L) 10.3(L)  Hematocrit 36.0 - 46.0 % 33.8(L) 30.1(L)  Platelets 150 - 400 K/uL 220 215    BNP No results found for: PROBNP  Lipid Panel  No results found for: CHOL   RADIOLOGY: No results found.  IMPRESSION:  1. Essential hypertension   2. OSA on CPAP   3. Hyperlipidemia with target LDL less than 70   4. Type 2 diabetes mellitus with diabetic nephropathy, without long-term current use of insulin ([place])   5. Chronic kidney disease (CKD) stage G4/A2, severely decreased glomerular filtration rate (GFR) between 15-29 mL/min/1.73 square meter and albuminuria creatinine ratio between 30-299 mg/g [facility])     ASSESSMENT AND PLAN: [name] is a 73 year old African-American female who has a history of diabetes mellitus, hyperlipidemia, hypertension, obstructive sleep apnea and progressive renal insufficiency.  She underwent insertion of an AV fistula in August 2019 but fortunately has not not had to initiate dialysis therapy.  She had recently been hospitalized overnight at [facility] with atypical chest pain.  On exam today she has definite chest wall tenderness suggestive of costochondritis.  She was concerned that she may have had a heart attack at [facility].  I reassured her today after reviewing the medical records in detail.  Troponins were negative.  ECG chest x-ray remained stable.  She has not had any exertional anginal type symptoms.  She has stage IV chronic kidney disease and creatinine had increased to 3.02 in October 2020.  She has an AV fistula in place but is not require dialysis initiation.  Her blood pressure today is stable and on repeat by me was 120/70 on her regimen consisting of amlodipine 5 mg daily, carvedilol 25 mg twice a day, furosemide 40 mg every other day in addition to losartan 100 mg daily.  She is followed by nephrology who has  continued ARB therapy.  She continues to be on Zetia and rosuvastatin 10 mg for hyperlipidemia.  Most recent lipid studies in October showed total cholesterol 138, triglycerides 132, LDL 73.  HDL was low at 39.  She is diabetic on Tradjenta, and Amaryl.  With reference to her sleep apnea, she is compliant with current therapy.  Since her 95th percentile pressure is 16.6, I am slightly increasing her CPAP minimum pressure to 12 mm and she will continue with a maximum pressure of 20.  She will be following up with her nephrologist and primary care physician in [place].  I will see her in 1 year for reevaluation.   Time spent; 30 minutes  Troy Sine, MD, [facility]  11/28/2019 6:41 PM

## 2019-11-26 NOTE — Patient Instructions (Addendum)
Medication Instructions:  Your physician recommends that you continue on your current medications as directed. Please refer to the Current Medication list given to you today.  *If you need a refill on your cardiac medications before your next appointment, please call your pharmacy*  Lab Work: NONE If you have labs (blood work) drawn today and your tests are completely normal, you will receive your results only by: Marland Kitchen MyChart Message (if you have MyChart) OR . A paper copy in the mail If you have any lab test that is abnormal or we need to change your treatment, we will call you to review the results.  Testing/Procedures: NONE  Follow-Up: At [facility], you and your health needs are our priority.  As part of our continuing mission to provide you with exceptional heart care, we have created designated Provider Care Teams.  These Care Teams include your primary Cardiologist (physician) and Advanced Practice Providers (APPs -  Physician Assistants and Nurse Practitioners) who all work together to provide you with the care you need, when you need it.  Your next appointment:   12 month(s)  The format for your next appointment:   In Person  Provider:   Shelva Majestic, MD  Other Instructions

## 2019-11-28 ENCOUNTER — Encounter: Payer: Self-pay | Admitting: Cardiovascular Disease

## 2020-01-04 IMAGING — DX DG CHEST 1V PORT
1 series · 1 of 1 positions shown · non-contrast
Comparison: 04/01/2017 chest radiograph.

CLINICAL DATA: Preoperative examination.

EXAM:
PORTABLE CHEST 1 VIEW

[chest ap]
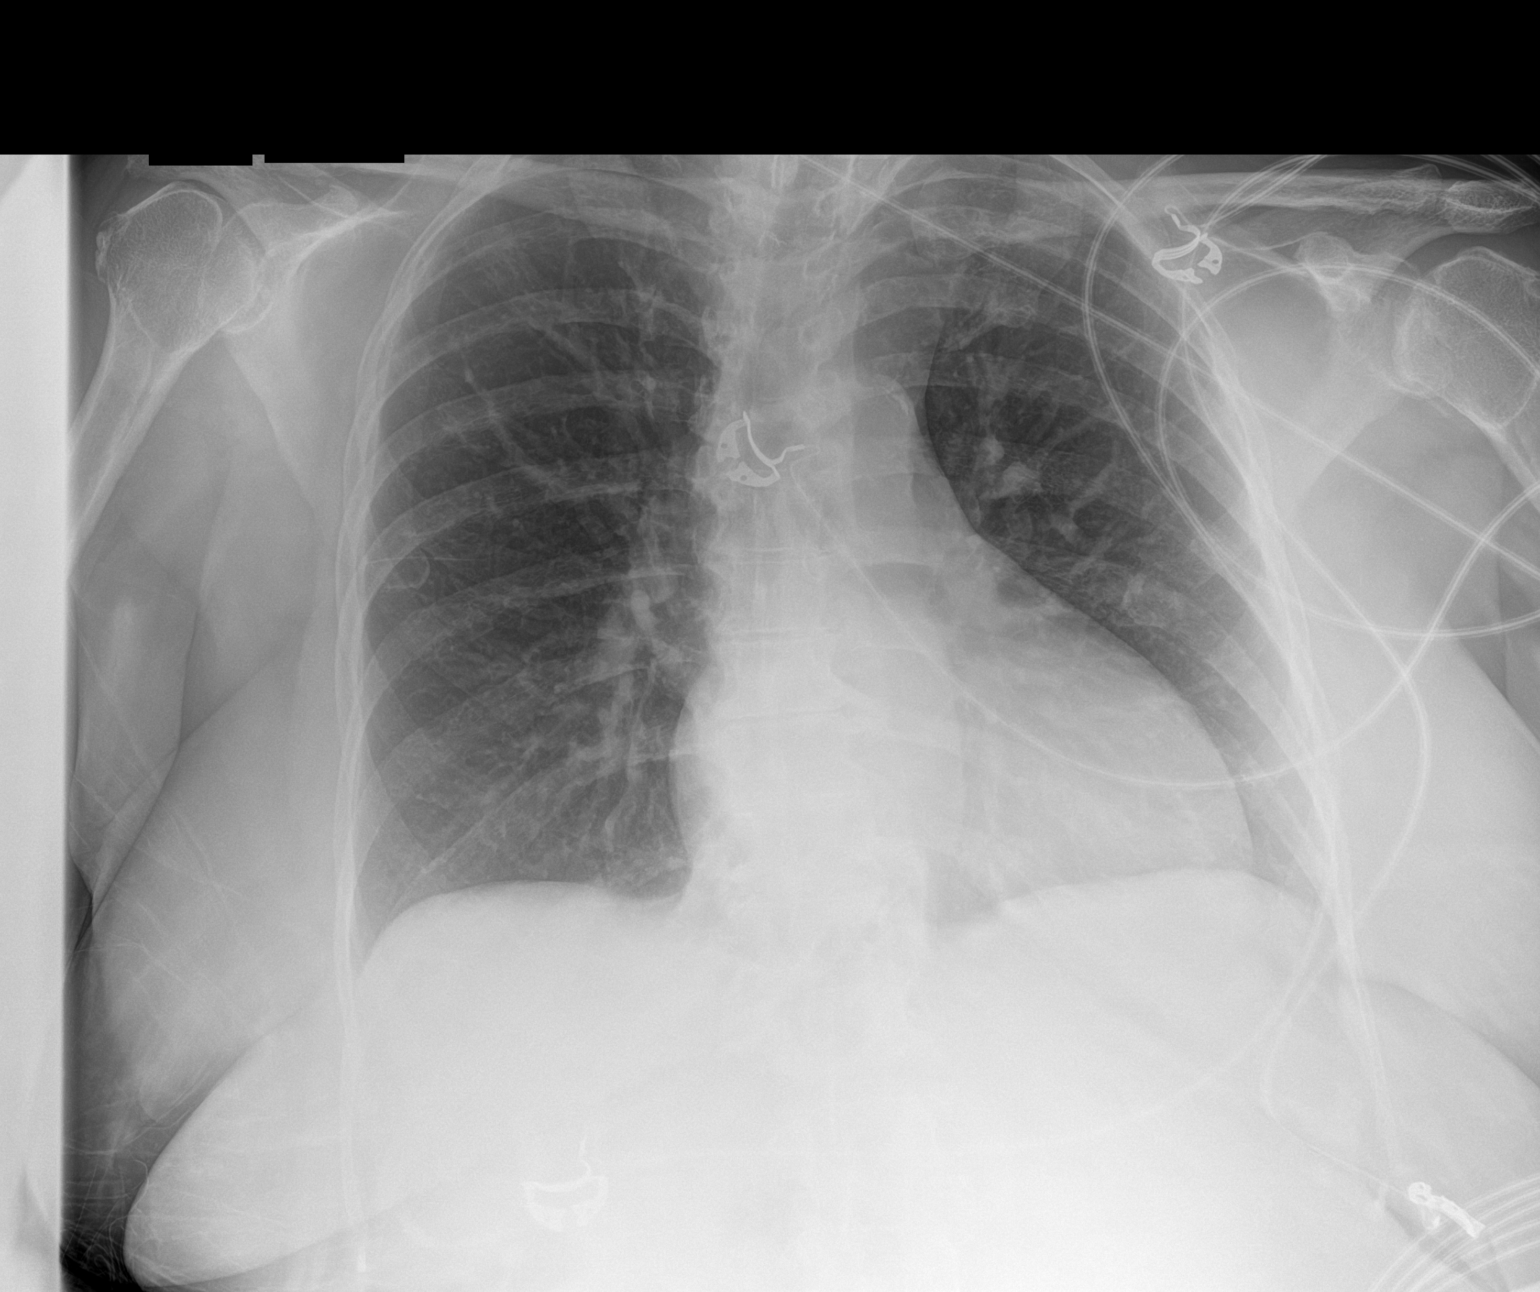

[1 of 1 positions shown; findings below may reference images not displayed]

FINDINGS: Stable cardiomediastinal silhouette with top-normal heart size. No
pneumothorax. No pleural effusion. Lungs appear clear, with no acute
consolidative airspace disease and no pulmonary edema.
IMPRESSION: No active disease.

## 2020-01-30 ENCOUNTER — Telehealth: Payer: Self-pay | Admitting: Cardiovascular Disease

## 2020-01-30 NOTE — Telephone Encounter (Signed)
Omega 3 is fine for our patients to take (unless they have a shellfish allergy).  However, she is already taking Lovaza, which is the same thing.  If she's looking at an OTC product, she doesn't need it, as the Lovaza is more pure and concentrated than anything she could get OTC.

## 2020-01-30 NOTE — Telephone Encounter (Signed)
Pt updated with the following from pharmD:  "Omega 3 is fine for our patients to take (unless they have a shellfish allergy).  However, she is already taking Lovaza, which is the same thing.  If she's looking at an OTC product, she doesn't need it, as the Lovaza is more pure and concentrated than anything she could get OTC."  Pt states she saw on TV 2 days ago that Omega-3 can cause MI. Triage nurse informed pharmd Tommy Medal, [facility] of pt concerns. Informed pt that per [place], RPH this is quite the opposite and omega-3 can help prevent MI. Pt verbalized understanding

## 2020-01-30 NOTE — Telephone Encounter (Signed)
New message   Patient wants to know if she can take Omega 3 and what are the side effects for a heart patient. Please advise.

## 2020-05-28 ENCOUNTER — Telehealth (INDEPENDENT_AMBULATORY_CARE_PROVIDER_SITE_OTHER): Payer: Self-pay

## 2020-05-28 NOTE — Telephone Encounter (Signed)
We can see if we can move up her appointment.  She should be seen by Dr. Delana Meyer because she has a wavelinq.  It is possible that her arm pain isn't related to her fistula.  At the last office note, it wasn't being used yet and taht also increases the possibility that it may be related to something other than her dialysis access.  She should also reach out to her PCP

## 2020-05-28 NOTE — Telephone Encounter (Signed)
A message has been left on the patient voicemail to return a call to our office

## 2020-05-28 NOTE — Telephone Encounter (Signed)
Patient was fine with Tramadol #30  being called into pharmacy [facility] in [place])

## 2020-06-11 ENCOUNTER — Ambulatory Visit (INDEPENDENT_AMBULATORY_CARE_PROVIDER_SITE_OTHER): Payer: Medicare Other | Admitting: Nurse Practitioner

## 2020-06-11 ENCOUNTER — Ambulatory Visit (INDEPENDENT_AMBULATORY_CARE_PROVIDER_SITE_OTHER): Payer: Medicare Other

## 2020-06-11 ENCOUNTER — Encounter (INDEPENDENT_AMBULATORY_CARE_PROVIDER_SITE_OTHER): Payer: Self-pay | Admitting: Vascular Surgery

## 2020-06-11 ENCOUNTER — Other Ambulatory Visit (INDEPENDENT_AMBULATORY_CARE_PROVIDER_SITE_OTHER): Payer: Self-pay | Admitting: Vascular Surgery

## 2020-06-11 ENCOUNTER — Other Ambulatory Visit: Payer: Self-pay

## 2020-06-11 VITALS — BP 121/69 | HR 72 | Resp 16 | Wt 175.2 lb

## 2020-06-11 DIAGNOSIS — N185 Chronic kidney disease, stage 5: Secondary | ICD-10-CM | POA: Diagnosis not present

## 2020-06-11 DIAGNOSIS — E1121 Type 2 diabetes mellitus with diabetic nephropathy: Secondary | ICD-10-CM | POA: Diagnosis not present

## 2020-06-11 DIAGNOSIS — E785 Hyperlipidemia, unspecified: Secondary | ICD-10-CM

## 2020-06-11 DIAGNOSIS — N186 End stage renal disease: Secondary | ICD-10-CM

## 2020-06-15 ENCOUNTER — Encounter (INDEPENDENT_AMBULATORY_CARE_PROVIDER_SITE_OTHER): Payer: Self-pay | Admitting: Nurse Practitioner

## 2020-06-15 NOTE — Progress Notes (Signed)
Subjective:    Patient ID: Tara Holmes, female    DOB: November 14, 1946, 74 y.o.   MRN: [id number] Chief Complaint  Patient presents with  . Follow-up    ref [place] deep access    Patient presents today for evaluation of her left upper extremity dialysis access.  The patient underwent a left ulnar ulnar AV fistula creation using radiofrequency with the wave linq system on 08/08/2018.  Since that time the access has not yet been utilized.  The patient contacted our office and noted that she was beginning to have upper extremity pain.  The patient notes that the pain was in her shoulder area.  The pain was worse with moving of her arm.  She also noted that the pain was worse when waking in the morning or after periods of immobility.  The patient also notes that tramadol that was given was helpful for pain relief.  She denies any cold or discoloration of her hand.  She denies any cramping in her hand or any ulceration.  The patient denies any uremic-like symptoms.  She denies any chest pain or shortness of breath.  Today noninvasive studies show a flow volume of 891.  The wave link is patent with no significant abnormality seen.   Review of Systems  Musculoskeletal: Positive for arthralgias.  All other systems reviewed and are negative.      Objective:   Physical Exam Vitals reviewed.  HENT:     Head: Normocephalic.  Cardiovascular:     Rate and Rhythm: Normal rate and regular rhythm.     Pulses: Normal pulses.          Radial pulses are 2+ on the right side and 2+ on the left side.     Heart sounds: Normal heart sounds.     Arteriovenous access: left arteriovenous access is present.    Comments: Good thrill and bruit left ulnar ulnar access Pulmonary:     Effort: Pulmonary effort is normal.     Breath sounds: Normal breath sounds.  Musculoskeletal:        General: Normal range of motion.  Skin:    General: Skin is warm and dry.     Capillary Refill: Capillary refill  takes less than 2 seconds.  Neurological:     Mental Status: She is alert and oriented to person, place, and time.  Psychiatric:        Mood and Affect: Mood normal.        Behavior: Behavior normal.        Thought Content: Thought content normal.        Judgment: Judgment normal.     BP 121/69 (BP Location: Right Arm)   Pulse 72   Resp 16   Wt 175 lb 3.2 oz (79.5 kg)   BMI 31.04 kg/m   Past Medical History:  Diagnosis Date  . Anemia   . Aneurysm ([place]) 1978  . Chronic kidney disease    ESRD  . Diabetes ([place])   . GERD (gastroesophageal reflux disease)   . Hypertension   . Sleep apnea     Social History   Socioeconomic History  . Marital status: Married    Spouse name: Percell Miller  . Number of children: 2  . Years of education: MA  . Highest education level: Not on file  Occupational History  . Occupation: Retired  Tobacco Use  . Smoking status: Never Smoker  . Smokeless tobacco: Never Used  Vaping Use  . Vaping Use:  Never used  Substance and Sexual Activity  . Alcohol use: No  . Drug use: Never  . Sexual activity: Not on file  Other Topics Concern  . Not on file  Social History Narrative   Patient lives at home with spouse.   Caffeine Use: none   Social Determinants of Health   Financial Resource Strain:   . Difficulty of Paying Living Expenses:   Food Insecurity:   . Worried About Charity fundraiser in the Last Year:   . Arboriculturist in the Last Year:   Transportation Needs:   . Film/video editor (Medical):   Marland Kitchen Lack of Transportation (Non-Medical):   Physical Activity:   . Days of Exercise per Week:   . Minutes of Exercise per Session:   Stress:   . Feeling of Stress :   Social Connections:   . Frequency of Communication with Friends and Family:   . Frequency of Social Gatherings with Friends and Family:   . Attends Religious Services:   . Active Member of Clubs or Organizations:   . Attends Archivist Meetings:   Marland Kitchen Marital  Status:   Intimate Partner Violence:   . Fear of Current or Ex-Partner:   . Emotionally Abused:   Marland Kitchen Physically Abused:   . Sexually Abused:     Past Surgical History:  Procedure Laterality Date  . AV FISTULA INSERTION W/ RF MAGNETIC GUIDANCE Left 08/08/2018   Procedure: AV FISTULA INSERTION W/RF MAGNETIC GUIDANCE;  Surgeon: Katha Cabal, MD;  Location: [facility];  Service: Cardiovascular;  Laterality: Left;  . BREAST SURGERY Left    fibroid  . TONSILLECTOMY      Family History  Problem Relation Age of Onset  . Heart attack Mother   . Diabetes Mother   . Colon cancer Father     Allergies  Allergen Reactions  . Amoxicillin Other (See Comments)    Pt states allergic reaction in vaginal region. Has patient had a PCN reaction causing immediate rash, facial/tongue/throat swelling, SOB or lightheadedness with hypotension: No Has patient had a PCN reaction causing severe rash involving mucus membranes or skin necrosis: No Has patient had a PCN reaction that required hospitalization: No Has patient had a PCN reaction occurring within the last 10 years: No Yeast infection reaction ONLY If all of the above answers are "NO", then may proceed with Ce  . Atorvastatin Other (See Comments)    MUSCLE ACHES   . Prednisone Other (See Comments)    ELEVATED BLOOD SUGAR  . Iodinated Diagnostic Agents Other (See Comments)    PATIENT IS UNAWARE OF THIS ALLERGY OR ANY REACTION TO THIS MEDICATION. Other reaction(s): Other (See Comments) PATIENT IS UNAWARE OF THIS ALLERGY OR ANY REACTION TO THIS MEDICATION.       Assessment & Plan:   1. Chronic kidney disease, stage V ([place]) Currently the patient is not yet utilizing her dialysis access.  The patient notes that there is no sick timeframe when they are expecting her to use it at this point.  Based upon the patient's description of pain it is likely that it is related to arthritis versus any steal-like symptoms.  Patient is advised  to follow-up with her primary care for further work-up of her arthritis.  Otherwise, we will have the patient follow-up on an annual basis.  Patient is advised to follow-up sooner if dialysis is being recommended.  2. Hyperlipidemia with target LDL less than 70 Continue statin  as ordered and reviewed, no changes at this time   3. Type 2 diabetes mellitus with diabetic nephropathy, without long-term current use of insulin (HCC) Continue hypoglycemic medications as already ordered, these medications have been reviewed and there are no changes at this time.  Hgb A1C to be monitored as already arranged by primary service    Current Outpatient Medications on File Prior to Visit  Medication Sig Dispense Refill  . ACCU-CHEK COMPACT PLUS test strip CHECK BLOOD SUGAR ONCE A DAY  3  . allopurinol (ZYLOPRIM) 100 MG tablet allopurinol 100 mg tablet  Take 1 tablet every day by oral route.    Marland Kitchen amLODipine (NORVASC) 10 MG tablet Take 5 mg by mouth every other day.     . carvedilol (COREG) 25 MG tablet Take 25 mg by mouth 2 (two) times daily.     Marland Kitchen ezetimibe (ZETIA) 10 MG tablet Take 10 mg by mouth daily.    . ferrous sulfate 325 (65 FE) MG EC tablet Take 325 mg by mouth every evening.   0  . furosemide (LASIX) 40 MG tablet Take 40 mg by mouth every other day.     Marland Kitchen glimepiride (AMARYL) 2 MG tablet Take 1 mg by mouth daily as needed (elevated blood sugar).     Marland Kitchen linagliptin (TRADJENTA) 5 MG TABS tablet Take 5 mg by mouth daily as needed (for elevated blood sugar).     Marland Kitchen losartan (COZAAR) 100 MG tablet Take 100 mg by mouth daily.    Marland Kitchen omega-3 acid ethyl esters (LOVAZA) 1 g capsule Take 1 g by mouth daily.    Marland Kitchen oxybutynin (DITROPAN) 5 MG tablet Take 5 mg by mouth 2 (two) times daily.     . rosuvastatin (CRESTOR) 10 MG tablet Take 10 mg by mouth every evening.   3  . traMADol (ULTRAM) 50 MG tablet Take by mouth every 6 (six) hours as needed.    Marland Kitchen acyclovir (ZOVIRAX) 200 MG capsule Take 1 capsule 5 times a  day by oral route for 7 days. (Patient not taking: Reported on 06/11/2020)    . cetirizine (ZYRTEC) 10 MG tablet Take by mouth. (Patient not taking: Reported on 06/11/2020)    . famotidine (PEPCID) 10 MG tablet Take by mouth daily.  (Patient not taking: Reported on 06/11/2020)     No current facility-administered medications on file prior to visit.    There are no Patient Instructions on file for this visit. No follow-ups on file.   Kris Hartmann, NP

## 2020-10-05 ENCOUNTER — Other Ambulatory Visit: Payer: Self-pay

## 2020-10-05 ENCOUNTER — Ambulatory Visit (INDEPENDENT_AMBULATORY_CARE_PROVIDER_SITE_OTHER): Payer: Medicare Other | Admitting: Cardiovascular Disease

## 2020-10-05 DIAGNOSIS — I10 Essential (primary) hypertension: Secondary | ICD-10-CM

## 2020-10-05 DIAGNOSIS — N185 Chronic kidney disease, stage 5: Secondary | ICD-10-CM | POA: Diagnosis not present

## 2020-10-05 DIAGNOSIS — G4733 Obstructive sleep apnea (adult) (pediatric): Secondary | ICD-10-CM | POA: Diagnosis not present

## 2020-10-05 DIAGNOSIS — I5032 Chronic diastolic (congestive) heart failure: Secondary | ICD-10-CM | POA: Diagnosis not present

## 2020-10-05 DIAGNOSIS — E1121 Type 2 diabetes mellitus with diabetic nephropathy: Secondary | ICD-10-CM

## 2020-10-05 DIAGNOSIS — E785 Hyperlipidemia, unspecified: Secondary | ICD-10-CM | POA: Diagnosis not present

## 2020-10-05 MED ORDER — ROSUVASTATIN CALCIUM 20 MG PO TABS: 20.0000 mg | ORAL_TABLET | Freq: Every day | ORAL | 3 refills | Status: DC

## 2020-10-05 NOTE — Patient Instructions (Signed)
Medication Instructions:    increase  Rosuvastatin  20 mg take daily   *If you need a refill on your cardiac medications before your next appointment, please call your pharmacy*   Lab Work: Not needed .   Testing/Procedures: Not needed   Follow-Up: At [facility], you and your health needs are our priority.  As part of our continuing mission to provide you with exceptional heart care, we have created designated Provider Care Teams.  These Care Teams include your primary Cardiologist (physician) and Advanced Practice Providers (APPs -  Physician Assistants and Nurse Practitioners) who all work together to provide you with the care you need, when you need it.    Your next appointment:   6 month(s)  The format for your next appointment:   In Person  Provider:   Shelva Majestic, MD

## 2020-10-05 NOTE — Progress Notes (Addendum)
Patient ID: Tara Holmes, female   DOB: 09-Aug-1946, 74 y.o.   MRN: [id number]     Primary M.D.: Dr. Quillian Quince Pomponsini  HPI: Tara Holmes is a 74 y.o. female who presents to the office today for a 10 month follow-up cardiology/sleep evaluation.  [name] has a long history of hypertension, family history for diabetes mellitus, hyperlipidemia, obesity, and is status post clipping of remote left brain aneurysm. She has a history of obstructive sleep apnea which was originally diagnosed over 10 years ago. This study had been done in [place].  She was able to obtain a new CPAP machine and now has a ResMed air since 10.  Mitts to using CPAP with 100% compliance.  Typically she goes to bed between 9 and 9:30 PM.  She believes her sleep is restorative.  She occasionally takes a daytime nap.  She has a history of hyperlipidemia for which she's been on Mevacor 40 mg, and fish oil capsules.  She is no longer taking WelChol.  Her blood pressure has been treated with losartan 100 mg, amlodipine 10 mg, carvedilol 25 mg twice a day and furosemide 40 mg. She is diabetic and now is on glimepiride and Tradjenta.  She was  evaluated at [facility] emergency room for chest pain on 04/02/2017.  Her chest pain was nonexertional and not described as a tightness.  It was felt to be somewhat atypical.  She saw her primary physician in follow-up the following day.  Her pain was described as sharp, which occurred in different locations on her chest nor not associated with radiation to her arms or jaw.  It would last 5 minutes and then dissipate spontaneously.    Since I saw her, she also has been evaluated at [facility] for chronic kidney disease stage IV.  There has been discussion for possible AV fistula formation in the very near future.  At her last evaluation.  Her serum creatinine was 3.6.  She had seen Dr. Lissa Merlin and recently saw Dr. Adonis Housekeeper.   I saw her in April 2018 in follow-up of her ER  evaluation.  I scheduled her for [facility] study which was done on 04/19/2017.  This revealed normal perfusion without scar or ischemia.  Ejection fraction was 55%.  I reviewed her most recent echo which showed LVH with normal systolic function with grade 2 diastolic dysfunction, aortic valve sclerosis without stenosis, and mitral annular calcification with trivial PR.  She has continued to use her CPAP.  She admits to 100% compliance.  I saw her in July 2018.  She developed progressive chronic kidney disease and had undergone insertion of an AV fistula in her left arm bands by Dr. Evelina Bucy.  Has been on amlodipine 5 mg, low 5 mg twice a day, furosemide 40 mg daily but pressure control.  She is diabetic he is on gloimeperide  but is no longer taking Tradjenta.  He used to use CPAP and her DME company is [employer] [facility] [place].  A download obtained in the office today from April 18 through May 04, 2018 demonstrated continued compliance and she was only sleeping 6 hours and 23 minutes with CPAP on a daily basis.  At a auto set up her 95th percentile pressure was16.8 with a maximum average pressure of 18.3.  She was using a fullface mask.  AHI was excellent at 1.3.   She was seen on September 03, 2019 for follow-up evaluation..  Over the prior year, her renal function had stabilized and  she has not required initiation of dialysis.  She is followed by Dr. Osker Mason.Tara Holmes She was compliant with CPAP therapy but often puts her hair is in curlers approximately every 7 to 10 days and therefore has not been sleeping with the CPAP mask when her hairs are in curlers.  A download was obtained in the office from August 13 through August 30, 2019.  She was compliant for 26 of 30 days with usage in the 4 days which she did not use therapy was when she had curlers in her hair at night.  She was averaging 8 hours and 21 minutes.  AHI is excellent at 1.3 and her CPAP auto settings range from 11 to 20 cm.  Her 95th percentile  pressure is 16.6 with a maximum average pressure of 18.4.  She uses a ResMed full facemask.  An Epworth Sleepiness Scale score was recalculated  and this endorsed at 3 arguing against daytime sleepiness.   She went to the emergency room on October 28, 2019 and was hospitalized overnight at [facility] for atypical chest pain.  Chest x-ray, EKG, serial troponins are unremarkable and she was discharged the following day.  She was concerned about this evaluation and wanted my opinion regarding her assessment.  It was felt most likely her chest pain was musculoskeletal in etiology.  I last saw her in December 2020.  At that time she had  improved compliance with her CPAP.  A new download was obtained from November 7 through November 24, 2019 which showed 90% compliance, averaging 7 hours and 55 minutes.  Her CPAP auto was set at a minimum pressure of 11 and maximum of 20, and her 95th percentile pressure was 16.6 with a maximum average pressure of 18.6.  She was sleeping well.    Since I last saw her, she has continued to be followed by Dr. Theador Hawthorne for her stage IV chronic kidney disease.  Laboratory in 09/22/2020 showed a hemoglobin of 9.4 hematocrit 30.1.  Creatinine in September 2021 was 3.23 which had increased from June 2021 at 3.0.  She was recently started on Sensipar which she takes on Monday Wednesday and Fridays.  Presently she denies any chest pain or shortness of breath.  Her AV fistula is 72 years old unfortunately she has not required use.  A download of her CPAP therapy in May 2021 showed she was meeting compliance and AHI was 1.4 with her auto mode of 10-20 with a set 95 percentile pressure of 17.1.  Attempt at obtaining a download did not reveal any data since May.  However she admits to continued use.  It appears that both she and her husband had refused supplies at the end of May since they had adequate supplies already from the DME company. She sees Dr. Harmon Pier for primary care  in [place].  Recent LDL cholesterol was 88 in June 2021 rosuvastatin 10 mg.  She presents for evaluation.  Past Medical History:  Diagnosis Date  . Anemia   . Aneurysm ([place]) 1978  . Chronic kidney disease    ESRD  . Diabetes ([place])   . GERD (gastroesophageal reflux disease)   . Hypertension   . Sleep apnea     Past Surgical History:  Procedure Laterality Date  . AV FISTULA INSERTION W/ RF MAGNETIC GUIDANCE Left 08/08/2018   Procedure: AV FISTULA INSERTION W/RF MAGNETIC GUIDANCE;  Surgeon: Katha Cabal, MD;  Location: [facility];  Service: Cardiovascular;  Laterality: Left;  .  BREAST SURGERY Left    fibroid  . TONSILLECTOMY      Allergies  Allergen Reactions  . Amoxicillin Other (See Comments)    Pt states allergic reaction in vaginal region. Has patient had a PCN reaction causing immediate rash, facial/tongue/throat swelling, SOB or lightheadedness with hypotension: No Has patient had a PCN reaction causing severe rash involving mucus membranes or skin necrosis: No Has patient had a PCN reaction that required hospitalization: No Has patient had a PCN reaction occurring within the last 10 years: No Yeast infection reaction ONLY If all of the above answers are "NO", then may proceed with Ce  . Atorvastatin Other (See Comments)    MUSCLE ACHES   . Prednisone Other (See Comments)    ELEVATED BLOOD SUGAR  . Iodinated Diagnostic Agents Other (See Comments)    PATIENT IS UNAWARE OF THIS ALLERGY OR ANY REACTION TO THIS MEDICATION. Other reaction(s): Other (See Comments) PATIENT IS UNAWARE OF THIS ALLERGY OR ANY REACTION TO THIS MEDICATION.    Current Outpatient Medications  Medication Sig Dispense Refill  . ACCU-CHEK COMPACT PLUS test strip CHECK BLOOD SUGAR ONCE A DAY  3  . allopurinol (ZYLOPRIM) 100 MG tablet allopurinol 100 mg tablet  Take 1 tablet every day by oral route.    Tara Holmes amLODipine (NORVASC) 5 MG tablet amlodipine 5 mg tablet  Take 1 tablet every  other day by oral route.    . carvedilol (COREG) 25 MG tablet Take 25 mg by mouth 2 (two) times daily.     . cetirizine (ZYRTEC) 10 MG tablet Take by mouth.     . cinacalcet (SENSIPAR) 30 MG tablet Take by mouth.    . ezetimibe (ZETIA) 10 MG tablet Take 10 mg by mouth daily.    . ferrous sulfate 325 (65 FE) MG EC tablet Take 325 mg by mouth every evening.   0  . furosemide (LASIX) 40 MG tablet Take 40 mg by mouth every other day.     Tara Holmes glimepiride (AMARYL) 2 MG tablet Take 1 mg by mouth daily as needed (elevated blood sugar).     Tara Holmes losartan (COZAAR) 100 MG tablet Take 100 mg by mouth daily.    Tara Holmes omega-3 acid ethyl esters (LOVAZA) 1 g capsule Take 1 g by mouth daily.    Tara Holmes oxybutynin (DITROPAN) 5 MG tablet Take 5 mg by mouth 2 (two) times daily.     . rosuvastatin (CRESTOR) 20 MG tablet Take 1 tablet (20 mg total) by mouth daily. 90 tablet 3   No current facility-administered medications for this visit.    Social History   Socioeconomic History  . Marital status: Married    Spouse name: Percell Miller  . Number of children: 2  . Years of education: MA  . Highest education level: Not on file  Occupational History  . Occupation: Retired  Tobacco Use  . Smoking status: Never Smoker  . Smokeless tobacco: Never Used  Vaping Use  . Vaping Use: Never used  Substance and Sexual Activity  . Alcohol use: No  . Drug use: Never  . Sexual activity: Not on file  Other Topics Concern  . Not on file  Social History Narrative   Patient lives at home with spouse.   Caffeine Use: none   Social Determinants of Health   Financial Resource Strain:   . Difficulty of Paying Living Expenses: Not on file  Food Insecurity:   . Worried About Charity fundraiser in the Last Year: Not  on file  . Ran Out of Food in the Last Year: Not on file  Transportation Needs:   . Lack of Transportation (Medical): Not on file  . Lack of Transportation (Non-Medical): Not on file  Physical Activity:   . Days of Exercise  per Week: Not on file  . Minutes of Exercise per Session: Not on file  Stress:   . Feeling of Stress : Not on file  Social Connections:   . Frequency of Communication with Friends and Family: Not on file  . Frequency of Social Gatherings with Friends and Family: Not on file  . Attends Religious Services: Not on file  . Active Member of Clubs or Organizations: Not on file  . Attends Archivist Meetings: Not on file  . Marital Status: Not on file  Intimate Partner Violence:   . Fear of Current or Ex-Partner: Not on file  . Emotionally Abused: Not on file  . Physically Abused: Not on file  . Sexually Abused: Not on file   Additional social history is notable in that she works as a Therapist, sports.  There is no tobacco, alcohol.  Family History  Problem Relation Age of Onset  . Heart attack Mother   . Diabetes Mother   . Colon cancer Father     ROS General: Negative; No fevers, chills, or night sweats;  HEENT: Negative; No changes in vision or hearing, sinus congestion, difficulty swallowing Pulmonary: Negative; No cough, wheezing, shortness of breath, hemoptysis Cardiovascular: Negative; No chest pain, presyncope, syncope, palpitations  left arm AV fistula GI: Negative; No nausea, vomiting, diarrhea, or abdominal pain GU: Negative; No dysuria, hematuria, or difficulty voiding Musculoskeletal: Negative; no myalgias, joint pain, or weakness Hematologic/Oncology: Negative; no easy bruising, bleeding Endocrine: Negative; no heat/cold intolerance; no diabetes Neuro: Negative; no changes in balance, headaches Skin: Negative; No rashes or skin lesions Psychiatric: Negative; No behavioral problems, depression Sleep: Positive for obstructive sleep apnea.   Epworth sleepiness score increased at 11 today.  No bruxism, restless legs, hypnogognic hallucinations, no cataplexy Other comprehensive 14 point system review is negative.   PE BP 126/70   Pulse 60   Ht 5' 3"  (1.6 m)   Wt 176 lb (79.8 kg)   SpO2 97%   BMI 31.18 kg/m    Repeat blood pressure by me was 120/70  Wt Readings from Last 3 Encounters:  10/05/20 176 lb (79.8 kg)  06/11/20 175 lb 3.2 oz (79.5 kg)  11/26/19 181 lb 9.6 oz (82.4 kg)    General: Alert, oriented, no distress.  Skin: normal turgor, no rashes, warm and dry HEENT: Normocephalic, atraumatic. Pupils equal round and reactive to light; sclera anicteric; extraocular muscles intact;  Nose without nasal septal hypertrophy Mouth/Parynx benign; Mallinpatti scale 3 Neck: No JVD, no carotid bruits; normal carotid upstroke Lungs: clear to ausculatation and percussion; no wheezing or rales Chest wall: without tenderness to palpitation Heart: PMI not displaced, RRR, s1 s2 normal, 1/6 systolic murmur, no diastolic murmur, no rubs, gallops, thrills, or heaves Abdomen: soft, nontender; no hepatosplenomehaly, BS+; abdominal aorta nontender and not dilated by palpation. Back: no CVA tenderness Pulses 2+ Musculoskeletal: full range of motion, normal strength, no joint deformities Extremities: AV fistula left arm; no clubbing cyanosis or edema, Homan's sign negative  Neurologic: grossly nonfocal; Cranial nerves grossly wnl Psychologic: Normal mood and affect   ECG (independently read by me): NSR at 60; NSSTT changes; normal intervals  November 26, 2019 ECG (independently read by me): Normal sinus  rhythm at 63 bpm with sinus arrhythmia.  Normal intervals.  September 03, 2019 ECG (independently read by me): Normal sinus rhythm at 69 bpm.  Mild T wave abnormality in aVL.  No ectopy.  ECG of 08/01/2018 was reviewed independently by me and shows sinus bradycardia 55 bpm.  There is mild T wave abnormality in leads I and aVL.  July 2018 ECG (independently read by me): Normal sinus rhythm at 61 bpm.  T-wave abnormality in leads 1 and aVL  April 2018 ECG (independently read by me): Normal sinus rhythm at 67 bpm.  Resolution of prior inferolateral  T wave inversion, however, there is new T-wave inversion in leads 1 and aVL.  May 2017 ECG (independently read by me): Normal sinus rhythm at 66 bpm.  Inferolateral T wave abnormality.  April 2015 ECG (independently read by me): Normal sinus rhythm 81 beats per minute.  Nonspecific ST changes in leads 1, AVL and V6.   12/31/2013 ECG (independently read by me): Sinus rhythm. Nondiagnostic T changes.  LABS: BMP Latest Ref Rng & Units 11/25/2018 08/01/2018  Glucose 70 - 99 mg/dL 276(H) 137(H)  BUN 8 - 23 mg/dL 46(H) 54(H)  Creatinine 0.44 - 1.00 mg/dL 3.01(H) 2.84(H)  Sodium 135 - 145 mmol/L 134(L) 142  Potassium 3.5 - 5.1 mmol/L 3.9 4.1  Chloride 98 - 111 mmol/L 99 106  CO2 22 - 32 mmol/L 24 29  Calcium 8.9 - 10.3 mg/dL 9.6 9.6    Hepatic Function Latest Ref Rng & Units 11/25/2018  Total Protein 6.5 - 8.1 g/dL 7.5  Albumin 3.5 - 5.0 g/dL 3.0(L)  AST 15 - 41 U/L 14(L)  ALT 0 - 44 U/L 19  Alk Phosphatase 38 - 126 U/L 61  Total Bilirubin 0.3 - 1.2 mg/dL 1.5(H)   CBC Latest Ref Rng & Units 11/25/2018 08/01/2018  WBC 4.0 - 10.5 K/uL 13.2(H) 6.6  Hemoglobin 12.0 - 15.0 g/dL 10.3(L) 10.3(L)  Hematocrit 36 - 46 % 33.8(L) 30.1(L)  Platelets 150 - 400 K/uL 220 215    BNP No results found for: PROBNP  Lipid Panel  No results found for: CHOL   RADIOLOGY: No results found.  IMPRESSION:  1. Essential hypertension   2. Hyperlipidemia with target LDL less than 70   3. OSA (obstructive sleep apnea)   4. Chronic kidney disease, stage V ([place])   5. Chronic diastolic heart failure ([place])   6. Type 2 diabetes mellitus with diabetic nephropathy, without long-term current use of insulin ([place])     ASSESSMENT AND PLAN: [name] is a 74 year old African-American female who has a history of diabetes mellitus, hyperlipidemia, hypertension, obstructive sleep apnea and progressive renal insufficiency.  She underwent insertion of an AV fistula in August 2019 but fortunately has not not had to  initiate dialysis therapy.  She had developed atypical chest pain leading to an overnight hospitalization at [facility] which most likely was musculoskeletal in etiology.  Her blood pressure today is stable on her medical regimen now consisting of amlodipine 5 mg every other day, carvedilol 25 mg twice a day, furosemide 40 mg every other day as well as losartan 100 mg daily.  She has stage IV chronic kidney disease and recently was started on Sensipar by Dr. Theador Hawthorne.  He is following her closely.  She has been on rosuvastatin 10 mg and Zetia 10 mg.  I have recommended further titration to 20 mg with target LDL less than 70 since her most recent LDL was 88.  She  is diabetic on glimepiride.  She has obstructive sleep apnea.  There was no data on a recent download but previously she had been compliant with use on a download up to May 15 2020 which showed an AHI of 1.4.  After discussion with both she and her husband it appeared that they had denied obtaining new supplies from the [facility] on 2 occasions.  This may have resulted in them no longer being linked to our office but both she and her husband admit to continued CPAP use.  We will try to see if we can get their units we linked to allow for continued follow-up of their CPAP therapy.  She is not having any anginal symptoms.  She continues to be on allopurinol with no recent gout.  She will be having follow-up laboratory in [place] with her primary physician.  I will see her in 6 months for reevaluation.  Troy Sine, MD, [facility]  10/07/2020 11:25 AM

## 2020-10-07 ENCOUNTER — Encounter: Payer: Self-pay | Admitting: Cardiovascular Disease

## 2020-10-20 DIAGNOSIS — S0990XA Unspecified injury of head, initial encounter: Secondary | ICD-10-CM

## 2020-10-20 HISTORY — DX: Unspecified injury of head, initial encounter: S09.90XA

## 2020-11-23 ENCOUNTER — Encounter: Payer: Self-pay | Admitting: *Deleted

## 2020-11-24 ENCOUNTER — Ambulatory Visit (INDEPENDENT_AMBULATORY_CARE_PROVIDER_SITE_OTHER): Payer: Medicare Other | Admitting: Diagnostic Neuroimaging

## 2020-11-24 ENCOUNTER — Encounter: Payer: Self-pay | Admitting: Diagnostic Neuroimaging

## 2020-11-24 VITALS — BP 118/61 | HR 65 | Ht 63.0 in | Wt 175.0 lb

## 2020-11-24 DIAGNOSIS — S0990XA Unspecified injury of head, initial encounter: Secondary | ICD-10-CM
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
## 2020-11-24 NOTE — Patient Instructions (Signed)
FALL / HEAD INJURY (10/20/20; woke up from sleeping in recliner) - improving; monitor symptoms

## 2021-05-12 ENCOUNTER — Ambulatory Visit: Payer: Medicare Other | Admitting: Cardiovascular Disease

## 2021-06-02 ENCOUNTER — Other Ambulatory Visit: Payer: Self-pay

## 2021-06-02 ENCOUNTER — Encounter: Payer: Self-pay | Admitting: Cardiovascular Disease

## 2021-06-02 ENCOUNTER — Ambulatory Visit (INDEPENDENT_AMBULATORY_CARE_PROVIDER_SITE_OTHER): Payer: Medicare Other | Admitting: Cardiovascular Disease

## 2021-06-02 VITALS — BP 136/60 | HR 60 | Ht 63.0 in | Wt 173.0 lb

## 2021-06-02 DIAGNOSIS — E1121 Type 2 diabetes mellitus with diabetic nephropathy: Secondary | ICD-10-CM

## 2021-06-02 DIAGNOSIS — N184 Chronic kidney disease, stage 4 (severe): Secondary | ICD-10-CM

## 2021-06-02 DIAGNOSIS — I10 Essential (primary) hypertension: Secondary | ICD-10-CM

## 2021-06-02 DIAGNOSIS — E785 Hyperlipidemia, unspecified: Secondary | ICD-10-CM | POA: Diagnosis not present

## 2021-06-02 DIAGNOSIS — G4733 Obstructive sleep apnea (adult) (pediatric): Secondary | ICD-10-CM | POA: Diagnosis not present

## 2021-06-02 DIAGNOSIS — Z9989 Dependence on other enabling machines and devices: Secondary | ICD-10-CM

## 2021-06-02 NOTE — Patient Instructions (Signed)
Medication Instructions:  Your physician recommends that you continue on your current medications as directed. Please refer to the Current Medication list given to you today.  *If you need a refill on your cardiac medications before your next appointment, please call your pharmacy*   Lab Work: None ordered.    Testing/Procedures: None ordered.    Follow-Up: At [facility], you and your health needs are our priority.  As part of our continuing mission to provide you with exceptional heart care, we have created designated Provider Care Teams.  These Care Teams include your primary Cardiologist (physician) and Advanced Practice Providers (APPs -  Physician Assistants and Nurse Practitioners) who all work together to provide you with the care you need, when you need it.  We recommend signing up for the patient portal called "MyChart".  Sign up information is provided on this After Visit Summary.  MyChart is used to connect with patients for Virtual Visits (Telemedicine).  Patients are able to view lab/test results, encounter notes, upcoming appointments, etc.  Non-urgent messages can be sent to your provider as well.   To learn more about what you can do with MyChart, go to [email or web address].    Your next appointment:   Per Dr. Claiborne Billings you may be seen with your husband, around May 07, 2022.   The format for your next appointment:   In Person  Provider:   Shelva Majestic, MD

## 2021-06-02 NOTE — Progress Notes (Signed)
Patient ID: Tara Holmes, female   DOB: 08/29/1946, 75 y.o.   MRN: [id number]     Primary M.D.: Dr. Quillian Quince Pomponsini  HPI: Tara Holmes is a 75 y.o. female who presents to the office today for an 8 month follow-up cardiology/sleep evaluation.  Tara Holmes has a long history of hypertension, family history for diabetes mellitus, hyperlipidemia, obesity, and is status post clipping of remote left brain aneurysm. She has a history of obstructive sleep apnea which was originally diagnosed over 10 years ago. This study had been done in [place].  She was able to obtain a new CPAP machine and now has a ResMed air since 10.  [place] to using CPAP with 100% compliance.  Typically she goes to bed between 9 and 9:30 PM.  She believes her sleep is restorative.  She occasionally takes a daytime nap.  She has a history of hyperlipidemia for which she's been on Mevacor 40 mg, and fish oil capsules.  She is no longer taking WelChol.  Her blood pressure has been treated with losartan 100 mg, amlodipine 10 mg, carvedilol 25 mg twice a day and furosemide 40 mg. She is diabetic and now is on glimepiride and Tradjenta.  She was  evaluated at [facility] emergency room for chest pain on 04/02/2017.  Her chest pain was nonexertional and not described as a tightness.  It was felt to be somewhat atypical.  She saw her primary physician in follow-up the following day.  Her pain was described as sharp, which occurred in different locations on her chest nor not associated with radiation to her arms or jaw.  It would last 5 minutes and then dissipate spontaneously.    Since I saw her, she also has been evaluated at [facility] for chronic kidney disease stage IV.  There has been discussion for possible AV fistula formation in the very near future.  At her last evaluation.  Her serum creatinine was 3.6.  She had seen Dr. Lissa Merlin and recently saw Dr. Adonis Housekeeper.   I saw her in April 2018 in follow-up of her ER  evaluation.  I scheduled her for [facility] study which was done on 04/19/2017.  This revealed normal perfusion without scar or ischemia.  Ejection fraction was 55%.  I reviewed her most recent echo which showed LVH with normal systolic function with grade 2 diastolic dysfunction, aortic valve sclerosis without stenosis, and mitral annular calcification with trivial PR.  She has continued to use her CPAP.  She admits to 100% compliance.  I saw her in July 2018.  She developed progressive chronic kidney disease and had undergone insertion of an AV fistula in her left arm bands by Dr. Evelina Bucy.  Has been on amlodipine 5 mg, low 5 mg twice a day, furosemide 40 mg daily but pressure control.  She is diabetic he is on gloimeperide  but is no longer taking Tradjenta.  He used to use CPAP and her DME company is [employer] in [place].  A download obtained in the office today from April 18 through May 04, 2018 demonstrated continued compliance and she was only sleeping 6 hours and 23 minutes with CPAP on a daily basis.  At a auto set up her 95th percentile pressure was16.8 with a maximum average pressure of 18.3.  She was using a fullface mask.  AHI was excellent at 1.3.   She was seen on September 03, 2019 for follow-up evaluation..  Over the prior year, her renal function had stabilized and  she has not required initiation of dialysis.  She is followed by Dr. Osker Mason.Marland Kitchen She was compliant with CPAP therapy but often puts her hair is in curlers approximately every 7 to 10 days and therefore has not been sleeping with the CPAP mask when her hairs are in curlers.  A download was obtained in the office from August 13 through August 30, 2019.  She was compliant for 26 of 30 days with usage in the 4 days which she did not use therapy was when she had curlers in her hair at night.  She was averaging 8 hours and 21 minutes.  AHI is excellent at 1.3 and her CPAP auto settings range from 11 to 20 cm.  Her 95th percentile  pressure is 16.6 with a maximum average pressure of 18.4.  She uses a ResMed full facemask.  An Epworth Sleepiness Scale score was recalculated  and this endorsed at 3 arguing against daytime sleepiness.   She went to the emergency room on October 28, 2019 and was hospitalized overnight at [facility] for atypical chest pain.  Chest x-ray, EKG, serial troponins are unremarkable and she was discharged the following day.  She was concerned about this evaluation and wanted my opinion regarding her assessment.  It was felt most likely her chest pain was musculoskeletal in etiology.  I saw her in December 2020.  At that time she had  improved compliance with her CPAP.  A new download was obtained from November 7 through November 24, 2019 which showed 90% compliance, averaging 7 hours and 55 minutes.  Her CPAP auto was set at a minimum pressure of 11 and maximum of 20, and her 95th percentile pressure was 16.6 with a maximum average pressure of 18.6.  She was sleeping well.    Since I saw her, she has continued to be followed by Dr. Theador Hawthorne for her stage IV chronic kidney disease.  Laboratory in 09/22/2020 showed a hemoglobin of 9.4 hematocrit 30.1.  Creatinine in September 2021 was 3.23 which had increased from June 2021 at 3.0.  She was recently started on Sensipar which she takes on Monday Wednesday and Fridays.  I last saw her in October 05, 2020 at which time she continued to well and denied any chest pain or shortness of breath.  Her AV fistula is 73 years old unfortunately she has not required use.  A download of her CPAP therapy in May 2021 showed she was meeting compliance and AHI was 1.4 with her auto mode of 10-20 with a set 95 percentile pressure of 17.1.  Attempt at obtaining a download did not reveal any data since May.  However she admits to continued use.  It appears that both she and her husband had refused supplies at the end of May since they had adequate supplies already from the  DME company. She sees Dr. Harmon Pier for primary care in [place].  Recent LDL cholesterol was 88 in June 2021 rosuvastatin 10 mg.    I last saw her on October 05, 2020 at which time she continued to do well.  She has continued to be followed by Dr. Theador Hawthorne in [place] for her CKD and was started on Cinacalat 30 mg on Monday Wednesday and Friday.  She is now followed by Dr. Manuella Ghazi eating for primary care.  She is on amlodipine 5 mg, carvedilol 25 mg twice a day, losartan 100 mg daily and furosemide 40 mg daily for hypertension.  She is diabetic on glimepiride 2  mg.  For her hyperlipidemia she is on Zetia 10 mg, rosuvastatin at 10 mg, in addition to Lovaza 1 g daily.  To use CPAP.  I obtained a new download from her DME company on May 16 through June 01, 2021.  Compliance is excellent averaging 7 hours and 39 minutes.  Her CPAP has been set at a minimum pressure of 12 and maximum of 20.  AHI is 3.0.  She is achieving high pressures with 95th percentile pressure 18.3 and maximum average pressure at 19.1.  She is unaware of breakthrough snoring.  A new Epworth scale was calculated in the office today and this was negative for residual daytime sleepiness calculated at 9.  She presents for evaluation   Past Medical History:  Diagnosis Date   Anemia    Aneurysm ([place]) 1978   Arthritis    Chronic kidney disease    ESRD   Diabetes ([place])    Encephalomalacia    GERD (gastroesophageal reflux disease)    Gout    Head injury 10/20/2020   d/t fall   Heart disease    Hypertension    Mild memory disturbance    Seizures ([place])    Sleep apnea     Past Surgical History:  Procedure Laterality Date   AV FISTULA INSERTION W/ RF MAGNETIC GUIDANCE Left 08/08/2018   Procedure: AV FISTULA INSERTION W/RF MAGNETIC GUIDANCE;  Surgeon: Katha Cabal, MD;  Location: [facility];  Service: Cardiovascular;  Laterality: Left;   BREAST SURGERY Left    fibroid   TONSILLECTOMY      Allergies  Allergen  Reactions   Amoxicillin Other (See Comments)    Pt states allergic reaction in vaginal region. Has patient had a PCN reaction causing immediate rash, facial/tongue/throat swelling, SOB or lightheadedness with hypotension: No Has patient had a PCN reaction causing severe rash involving mucus membranes or skin necrosis: No Has patient had a PCN reaction that required hospitalization: No Has patient had a PCN reaction occurring within the last 10 years: No Yeast infection reaction ONLY If all of the above answers are "NO", then may proceed with Ce   Atorvastatin Other (See Comments)    MUSCLE ACHES    Prednisone Other (See Comments)    ELEVATED BLOOD SUGAR   Iodinated Diagnostic Agents Other (See Comments)    PATIENT IS UNAWARE OF THIS ALLERGY OR ANY REACTION TO THIS MEDICATION. Other reaction(s): Other (See Comments) PATIENT IS UNAWARE OF THIS ALLERGY OR ANY REACTION TO THIS MEDICATION.    Current Outpatient Medications  Medication Sig Dispense Refill   ACCU-CHEK COMPACT PLUS test strip CHECK BLOOD SUGAR ONCE A DAY  3   allopurinol (ZYLOPRIM) 100 MG tablet allopurinol 100 mg tablet  Take 1 tablet every day by oral route.     amLODipine (NORVASC) 5 MG tablet amlodipine 5 mg tablet  Take 1 tablet every other day by oral route.     carvedilol (COREG) 25 MG tablet Take 25 mg by mouth 2 (two) times daily.      cinacalcet (SENSIPAR) 30 MG tablet Take 30 mg by mouth daily.     ezetimibe (ZETIA) 10 MG tablet Take 10 mg by mouth daily.     ferrous sulfate 325 (65 FE) MG EC tablet Take 325 mg by mouth every evening.   0   furosemide (LASIX) 40 MG tablet Take 40 mg by mouth daily.     glimepiride (AMARYL) 2 MG tablet Take 1 mg by mouth daily as  needed (elevated blood sugar).      losartan (COZAAR) 100 MG tablet Take 100 mg by mouth daily.     omega-3 acid ethyl esters (LOVAZA) 1 g capsule Take 1 g by mouth daily.     oxybutynin (DITROPAN) 5 MG tablet Take 5 mg by mouth 2 (two) times daily.       rosuvastatin (CRESTOR) 10 MG tablet Take 10 mg by mouth daily.     No current facility-administered medications for this visit.    Social History   Socioeconomic History   Marital status: Married    Spouse name: Percell Miller   Number of children: 2   Years of education: MA   Highest education level: Not on file  Occupational History   Occupation: Retired  Tobacco Use   Smoking status: Never   Smokeless tobacco: Never  Vaping Use   Vaping Use: Never used  Substance and Sexual Activity   Alcohol use: No   Drug use: Never   Sexual activity: Not on file  Other Topics Concern   Not on file  Social History Narrative   Patient lives at home with spouse.   Caffeine Use: none   Social Determinants of Radio broadcast assistant Strain: Not on file  Food Insecurity: Not on file  Transportation Needs: Not on file  Physical Activity: Not on file  Stress: Not on file  Social Connections: Not on file  Intimate Partner Violence: Not on file   Additional social history is notable in that she works as a Therapist, sports.  There is no tobacco, alcohol.  Family History  Problem Relation Age of Onset   Heart attack Mother    Diabetes Mother    Colon cancer Father     ROS General: Negative; No fevers, chills, or night sweats;  HEENT: Negative; No changes in vision or hearing, sinus congestion, difficulty swallowing Pulmonary: Negative; No cough, wheezing, shortness of breath, hemoptysis Cardiovascular: Negative; No chest pain, presyncope, syncope, palpitations  left arm AV fistula GI: Negative; No nausea, vomiting, diarrhea, or abdominal pain GU: Negative; No dysuria, hematuria, or difficulty voiding Musculoskeletal: Negative; no myalgias, joint pain, or weakness Hematologic/Oncology: Negative; no easy bruising, bleeding Endocrine: Negative; no heat/cold intolerance; no diabetes Neuro: Negative; no changes in balance, headaches Skin: Negative; No rashes or skin  lesions Psychiatric: Negative; No behavioral problems, depression Sleep: Positive for obstructive sleep apnea.   Epworth sleepiness score increased at 11 today.  No bruxism, restless legs, hypnogognic hallucinations, no cataplexy Other comprehensive 14 point system review is negative.   PE BP 136/60 (BP Location: Right Arm)   Pulse 60   Ht '5\' 3"'  (1.6 m)   Wt 173 lb (78.5 kg)   SpO2 99%   BMI 30.65 kg/m    Repeat blood pressure he was excellent at 122/64  Wt Readings from Last 3 Encounters:  06/02/21 173 lb (78.5 kg)  11/24/20 175 lb (79.4 kg)  10/05/20 176 lb (79.8 kg)    General: Alert, oriented, no distress.  Skin: normal turgor, no rashes, warm and dry HEENT: Normocephalic, atraumatic. Pupils equal round and reactive to light; sclera anicteric; extraocular muscles intact;  Nose without nasal septal hypertrophy Mouth/Parynx benign; Mallinpatti scale 3 Neck: No JVD, no carotid bruits; normal carotid upstroke Lungs: clear to ausculatation and percussion; no wheezing or rales Chest wall: without tenderness to palpitation Heart: PMI not displaced, RRR, s1 s2 normal, 1/6 systolic murmur, no diastolic murmur, no rubs, gallops, thrills, or heaves Abdomen: soft, nontender; no  hepatosplenomehaly, BS+; abdominal aorta nontender and not dilated by palpation. Back: no CVA tenderness Pulses 2+ Musculoskeletal: full range of motion, normal strength, no joint deformities Extremities: AV fistula left arm; no clubbing cyanosis or edema, Homan's sign negative  Neurologic: grossly nonfocal; Cranial nerves grossly wnl Psychologic: Normal mood and affect  ECG (independently read by me):  NSR at 60; T wave abnormality , no ectopy  October 2021 ECG (independently read by me): NSR at 60; NSSTT changes; normal intervals  November 26, 2019 ECG (independently read by me): Normal sinus rhythm at 63 bpm with sinus arrhythmia.  Normal intervals.  September 03, 2019 ECG (independently read by me):  Normal sinus rhythm at 69 bpm.  Mild T wave abnormality in aVL.  No ectopy.  ECG of 08/01/2018 was reviewed independently by me and shows sinus bradycardia 55 bpm.  There is mild T wave abnormality in leads I and aVL.  July 2018 ECG (independently read by me): Normal sinus rhythm at 61 bpm.  T-wave abnormality in leads 1 and aVL  April 2018 ECG (independently read by me): Normal sinus rhythm at 67 bpm.  Resolution of prior inferolateral T wave inversion, however, there is new T-wave inversion in leads 1 and aVL.  May 2017 ECG (independently read by me): Normal sinus rhythm at 66 bpm.  Inferolateral T wave abnormality.  April 2015 ECG (independently read by me): Normal sinus rhythm 81 beats per minute.  Nonspecific ST changes in leads 1, AVL and V6.   12/31/2013 ECG (independently read by me): Sinus rhythm. Nondiagnostic T changes.  LABS: BMP Latest Ref Rng & Units 11/25/2018 08/01/2018  Glucose 70 - 99 mg/dL 276(H) 137(H)  BUN 8 - 23 mg/dL 46(H) 54(H)  Creatinine 0.44 - 1.00 mg/dL 3.01(H) 2.84(H)  Sodium 135 - 145 mmol/L 134(L) 142  Potassium 3.5 - 5.1 mmol/L 3.9 4.1  Chloride 98 - 111 mmol/L 99 106  CO2 22 - 32 mmol/L 24 29  Calcium 8.9 - 10.3 mg/dL 9.6 9.6    Hepatic Function Latest Ref Rng & Units 11/25/2018  Total Protein 6.5 - 8.1 g/dL 7.5  Albumin 3.5 - 5.0 g/dL 3.0(L)  AST 15 - 41 U/L 14(L)  ALT 0 - 44 U/L 19  Alk Phosphatase 38 - 126 U/L 61  Total Bilirubin 0.3 - 1.2 mg/dL 1.5(H)   CBC Latest Ref Rng & Units 11/25/2018 08/01/2018  WBC 4.0 - 10.5 K/uL 13.2(H) 6.6  Hemoglobin 12.0 - 15.0 g/dL 10.3(L) 10.3(L)  Hematocrit 36.0 - 46.0 % 33.8(L) 30.1(L)  Platelets 150 - 400 K/uL 220 215    BNP No results found for: PROBNP  Lipid Panel  No results found for: CHOL   RADIOLOGY: No results found.  IMPRESSION:  1. Essential hypertension   2. Hyperlipidemia with target LDL less than 70   3. OSA on CPAP   4. Type 2 diabetes mellitus with diabetic nephropathy, without  long-term current use of insulin ([place])   5. Chronic kidney disease (CKD) stage G4/A2, severely decreased glomerular filtration rate (GFR) between 15-29 mL/min/1.73 square meter and albuminuria creatinine ratio between 30-299 mg/g [facility])      ASSESSMENT AND PLAN: Ms. Wellnitz is a 75 year-old African-American female who has a history of diabetes mellitus, hyperlipidemia, hypertension, obstructive sleep apnea and progressive renal insufficiency.  She underwent insertion of an AV fistula in August 2019 but fortunately has not not had to initiate dialysis therapy.  She developed atypical chest pain leading to an overnight hospitalization at [facility]  which most likely was musculoskeletal in etiology.  Her blood pressure today continues to be stable on a regimen of amlodipine 5 mg which she is now taking every other day, losartan 100 mg daily, and furosemide 40 mg daily.  She sees Dr. Theador Hawthorne regularly for her knee.  She has not required dialysis.  He has been wearing support stockings with benefit.  She is on Zetia, rosuvastatin 10 mg, and low vase of 4 hyperlipidemia.  Target LDL is less than 70.  She is diabetic on glimepiride 1 mg.  She continues to use CPAP and has excellent compliance.  AHI is stable at 3.0 with her auto setting range from 12 to 20 cm of water.  Since her 95th percentile pressure is 18.3, I am recommending changing her auto pressures to a minimum pressure of 14 and will continue with the maximum pressure of 20.  Her DME company is in [place].  We had not been able to get it linked to our office but we were able to have her bring a download with her today from a recent interrogation on June 01, 2021.  Had COVID infection with cough without fever in May and otherwise was fairly asymptomatic.  She will continue current therapy and follow-up with Dr. Theador Hawthorne.  I will see her the next year when I see her husband as well.    Troy Sine, MD, [facility]  06/04/2021 3:53 PM

## 2021-06-04 ENCOUNTER — Encounter: Payer: Self-pay | Admitting: Cardiovascular Disease

## 2021-06-06 ENCOUNTER — Other Ambulatory Visit (INDEPENDENT_AMBULATORY_CARE_PROVIDER_SITE_OTHER): Payer: Self-pay | Admitting: Nurse Practitioner

## 2021-06-06 DIAGNOSIS — N186 End stage renal disease: Secondary | ICD-10-CM

## 2021-06-10 ENCOUNTER — Ambulatory Visit (INDEPENDENT_AMBULATORY_CARE_PROVIDER_SITE_OTHER): Payer: Medicare Other | Admitting: Vascular Surgery

## 2021-06-10 ENCOUNTER — Encounter (INDEPENDENT_AMBULATORY_CARE_PROVIDER_SITE_OTHER): Payer: Medicare Other

## 2021-07-01 ENCOUNTER — Ambulatory Visit (INDEPENDENT_AMBULATORY_CARE_PROVIDER_SITE_OTHER): Payer: Medicare Other | Admitting: Nurse Practitioner

## 2021-07-01 ENCOUNTER — Other Ambulatory Visit: Payer: Self-pay

## 2021-07-01 ENCOUNTER — Ambulatory Visit (INDEPENDENT_AMBULATORY_CARE_PROVIDER_SITE_OTHER): Payer: Medicare Other

## 2021-07-01 VITALS — BP 126/67 | HR 65 | Resp 16 | Wt 168.0 lb

## 2021-07-01 DIAGNOSIS — N185 Chronic kidney disease, stage 5: Secondary | ICD-10-CM | POA: Diagnosis not present

## 2021-07-01 DIAGNOSIS — E1121 Type 2 diabetes mellitus with diabetic nephropathy: Secondary | ICD-10-CM

## 2021-07-01 DIAGNOSIS — N186 End stage renal disease: Secondary | ICD-10-CM | POA: Diagnosis not present

## 2021-07-01 DIAGNOSIS — I10 Essential (primary) hypertension: Secondary | ICD-10-CM | POA: Diagnosis not present

## 2021-07-11 ENCOUNTER — Encounter (INDEPENDENT_AMBULATORY_CARE_PROVIDER_SITE_OTHER): Payer: Self-pay | Admitting: Nurse Practitioner

## 2021-07-11 NOTE — Progress Notes (Signed)
Subjective:    Patient ID: Tara Holmes, female    DOB: 09/10/1946, 75 y.o.   MRN: [id number] Chief Complaint  Patient presents with   Follow-up    Ultrasound follow up    The patient returns to the office for followup of their dialysis access. The function of the access has been stable. The patient currently does not use access as she has not yet begun dialysis.  The patient denies redness or swelling at the access site. The patient denies fever or chills at home. The patient has a flow volume of 579.  There is no focal velocity increase or internal stenosis noted.  The radial and ulnar arteries have antegrade biphasic flow noted.       Review of Systems  All other systems reviewed and are negative.     Objective:   Physical Exam Vitals reviewed.  HENT:     Head: Normocephalic.  Cardiovascular:     Rate and Rhythm: Normal rate.     Pulses: Normal pulses.          Radial pulses are 2+ on the left side.     Arteriovenous access: Left arteriovenous access is present.    Comments: Good thrill and bruit Pulmonary:     Effort: Pulmonary effort is normal.  Neurological:     Mental Status: She is alert and oriented to person, place, and time.  Psychiatric:        Mood and Affect: Mood normal.        Behavior: Behavior normal.        Thought Content: Thought content normal.        Judgment: Judgment normal.    BP 126/67 (BP Location: Right Arm)   Pulse 65   Resp 16   Wt 168 lb (76.2 kg)   BMI 29.76 kg/m   Past Medical History:  Diagnosis Date   Anemia    Aneurysm ([place]) 1978   Arthritis    Chronic kidney disease    ESRD   Diabetes ([place])    Encephalomalacia    GERD (gastroesophageal reflux disease)    Gout    Head injury 10/20/2020   d/t fall   Heart disease    Hypertension    Mild memory disturbance    Seizures (HCC)    Sleep apnea     Social History   Socioeconomic History   Marital status: Married    Spouse name: Percell Miller   Number of  children: 2   Years of education: MA   Highest education level: Not on file  Occupational History   Occupation: Retired  Tobacco Use   Smoking status: Never   Smokeless tobacco: Never  Vaping Use   Vaping Use: Never used  Substance and Sexual Activity   Alcohol use: No   Drug use: Never   Sexual activity: Not on file  Other Topics Concern   Not on file  Social History Narrative   Patient lives at home with spouse.   Caffeine Use: none   Social Determinants of Health   Financial Resource Strain: Not on file  Food Insecurity: Not on file  Transportation Needs: Not on file  Physical Activity: Not on file  Stress: Not on file  Social Connections: Not on file  Intimate Partner Violence: Not on file    Past Surgical History:  Procedure Laterality Date   AV FISTULA INSERTION W/ RF MAGNETIC GUIDANCE Left 08/08/2018   Procedure: AV FISTULA INSERTION W/RF MAGNETIC GUIDANCE;  Surgeon:  Schnier, Dolores Lory, MD;  Location: [facility];  Service: Cardiovascular;  Laterality: Left;   BREAST SURGERY Left    fibroid   TONSILLECTOMY      Family History  Problem Relation Age of Onset   Heart attack Mother    Diabetes Mother    Colon cancer Father     Allergies  Allergen Reactions   Amoxicillin Other (See Comments)    Pt states allergic reaction in vaginal region. Has patient had a PCN reaction causing immediate rash, facial/tongue/throat swelling, SOB or lightheadedness with hypotension: No Has patient had a PCN reaction causing severe rash involving mucus membranes or skin necrosis: No Has patient had a PCN reaction that required hospitalization: No Has patient had a PCN reaction occurring within the last 10 years: No Yeast infection reaction ONLY If all of the above answers are "NO", then may proceed with Ce   Atorvastatin Other (See Comments)    MUSCLE ACHES    Prednisone Other (See Comments)    ELEVATED BLOOD SUGAR   Iodinated Diagnostic Agents Other (See  Comments)    PATIENT IS UNAWARE OF THIS ALLERGY OR ANY REACTION TO THIS MEDICATION. Other reaction(s): Other (See Comments) PATIENT IS UNAWARE OF THIS ALLERGY OR ANY REACTION TO THIS MEDICATION.    CBC Latest Ref Rng & Units 11/25/2018 08/01/2018  WBC 4.0 - 10.5 K/uL 13.2(H) 6.6  Hemoglobin 12.0 - 15.0 g/dL 10.3(L) 10.3(L)  Hematocrit 36.0 - 46.0 % 33.8(L) 30.1(L)  Platelets 150 - 400 K/uL 220 215      CMP     Component Value Date/Time   NA 134 (L) 11/25/2018 1507   K 3.9 11/25/2018 1507   CL 99 11/25/2018 1507   CO2 24 11/25/2018 1507   GLUCOSE 276 (H) 11/25/2018 1507   BUN 46 (H) 11/25/2018 1507   CREATININE 3.01 (H) 11/25/2018 1507   CALCIUM 9.6 11/25/2018 1507   PROT 7.5 11/25/2018 1507   ALBUMIN 3.0 (L) 11/25/2018 1507   AST 14 (L) 11/25/2018 1507   ALT 19 11/25/2018 1507   ALKPHOS 61 11/25/2018 1507   BILITOT 1.5 (H) 11/25/2018 1507   GFRNONAA 15 (L) 11/25/2018 1507   GFRAA 18 (L) 11/25/2018 1507     No results found.     Assessment & Plan:   1. Chronic kidney disease, stage V [facility]) Patient currently still has not started dialysis.  While her axis does show decreased flow volume we will not proceed with intervention at this time.  That is because the patient is not on dialysis and any intervention will likely cause some kidney injury possibly sugar into dialysis.  Otherwise patient's fistula is functioning fine.  We will continue to have the patient follow-up in 1 year.  However the patient is notified that she will be on dialysis she will need to follow-up sooner.  2. Primary hypertension Continue antihypertensive medications as already ordered, these medications have been reviewed and there are no changes at this time.   3. Type 2 diabetes mellitus with diabetic nephropathy, without long-term current use of insulin (HCC) Continue hypoglycemic medications as already ordered, these medications have been reviewed and there are no changes at this time.  Hgb A1C to  be monitored as already arranged by primary service    Current Outpatient Medications on File Prior to Visit  Medication Sig Dispense Refill   ACCU-CHEK COMPACT PLUS test strip CHECK BLOOD SUGAR ONCE A DAY  3   allopurinol (ZYLOPRIM) 100 MG tablet allopurinol 100  mg tablet  Take 1 tablet every day by oral route.     amLODipine (NORVASC) 5 MG tablet amlodipine 5 mg tablet  Take 1 tablet every other day by oral route.     carvedilol (COREG) 25 MG tablet Take 25 mg by mouth 2 (two) times daily.      cinacalcet (SENSIPAR) 30 MG tablet Take 30 mg by mouth daily.     ezetimibe (ZETIA) 10 MG tablet Take 10 mg by mouth daily.     furosemide (LASIX) 40 MG tablet Take 40 mg by mouth daily.     glimepiride (AMARYL) 2 MG tablet Take 1 mg by mouth daily as needed (elevated blood sugar).      losartan (COZAAR) 100 MG tablet Take 100 mg by mouth daily.     omega-3 acid ethyl esters (LOVAZA) 1 g capsule Take 1 g by mouth daily.     oxybutynin (DITROPAN) 5 MG tablet Take 5 mg by mouth 2 (two) times daily.      rosuvastatin (CRESTOR) 10 MG tablet Take 10 mg by mouth daily.     ferrous sulfate 325 (65 FE) MG EC tablet Take 325 mg by mouth every evening.  (Patient not taking: Reported on 07/01/2021)  0   No current facility-administered medications on file prior to visit.    There are no Patient Instructions on file for this visit. No follow-ups on file.   Kris Hartmann, NP

## 2022-06-29 NOTE — Progress Notes (Signed)
MRN : [id number]  Tara Holmes is a 76 y.o. ([birth year]) female who presents with chief complaint of check access.  History of Present Illness:   The patient returns to the office for followup of their dialysis access. The function of the access has been stable. The patient currently does not use access as she has not yet begun dialysis.   The patient denies redness or swelling at the access site. The patient denies fever or chills at home. The patient has a flow volume of 1385.  There is no focal velocity increase or internal stenosis noted.  The radial and ulnar arteries have antegrade biphasic flow noted.  No outpatient medications have been marked as taking for the 06/30/22 encounter (Appointment) with Delana Meyer, Dolores Lory, MD.    Past Medical History:  Diagnosis Date   Anemia    Aneurysm ([place]) 1978   Arthritis    Chronic kidney disease    ESRD   Diabetes ([place])    Encephalomalacia    GERD (gastroesophageal reflux disease)    Gout    Head injury 10/20/2020   d/t fall   Heart disease    Hypertension    Mild memory disturbance    Seizures ([place])    Sleep apnea     Past Surgical History:  Procedure Laterality Date   AV FISTULA INSERTION W/ RF MAGNETIC GUIDANCE Left 08/08/2018   Procedure: AV FISTULA INSERTION W/RF MAGNETIC GUIDANCE;  Surgeon: Katha Cabal, MD;  Location: [facility];  Service: Cardiovascular;  Laterality: Left;   BREAST SURGERY Left    fibroid   TONSILLECTOMY      Social History Social History   Tobacco Use   Smoking status: Never   Smokeless tobacco: Never  Vaping Use   Vaping Use: Never used  Substance Use Topics   Alcohol use: No   Drug use: Never    Family History Family History  Problem Relation Age of Onset   Heart attack Mother    Diabetes Mother    Colon cancer Father     Allergies  Allergen Reactions   Amoxicillin Other (See Comments)    Pt states allergic reaction in vaginal  region. Has patient had a PCN reaction causing immediate rash, facial/tongue/throat swelling, SOB or lightheadedness with hypotension: No Has patient had a PCN reaction causing severe rash involving mucus membranes or skin necrosis: No Has patient had a PCN reaction that required hospitalization: No Has patient had a PCN reaction occurring within the last 10 years: No Yeast infection reaction ONLY If all of the above answers are "NO", then may proceed with Ce   Atorvastatin Other (See Comments)    MUSCLE ACHES    Prednisone Other (See Comments)    ELEVATED BLOOD SUGAR   Iodinated Contrast Media Other (See Comments)    PATIENT IS UNAWARE OF THIS ALLERGY OR ANY REACTION TO THIS MEDICATION. Other reaction(s): Other (See Comments) PATIENT IS UNAWARE OF THIS ALLERGY OR ANY REACTION TO THIS MEDICATION.     REVIEW OF SYSTEMS (Negative unless checked)  Constitutional: [] Weight loss  [] Fever  [] Chills Cardiac: [] Chest pain   [] Chest pressure   [] Palpitations   [] Shortness of breath when laying flat   [] Shortness of breath with exertion. Vascular:  [] Pain in legs with walking   [] Pain in legs at rest  [] History of DVT   [] Phlebitis   [] Swelling in legs   [] Varicose veins   []   Non-healing ulcers Pulmonary:   [] Uses home oxygen   [] Productive cough   [] Hemoptysis   [] Wheeze  [] COPD   [] Asthma Neurologic:  [] Dizziness   [] Seizures   [] History of stroke   [] History of TIA  [] Aphasia   [] Vissual changes   [] Weakness or numbness in arm   [] Weakness or numbness in leg Musculoskeletal:   [] Joint swelling   [] Joint pain   [] Low back pain Hematologic:  [] Easy bruising  [] Easy bleeding   [] Hypercoagulable state   [] Anemic Gastrointestinal:  [] Diarrhea   [] Vomiting  [] Gastroesophageal reflux/heartburn   [] Difficulty swallowing. Genitourinary:  [x] Chronic kidney disease   [] Difficult urination  [] Frequent urination   [] Blood in urine Skin:  [] Rashes   [] Ulcers  Psychological:  [] History of anxiety   []   History of major depression.  Physical Examination  There were no vitals filed for this visit. There is no height or weight on file to calculate BMI. Gen: WD/WN, NAD Head: [place]/AT, No temporalis wasting.  Ear/Nose/Throat: Hearing grossly intact, nares w/o erythema or drainage Eyes: PER, EOMI, sclera nonicteric.  Neck: Supple, no gross masses or lesions.  No JVD.  Pulmonary:  Good air movement, no audible wheezing, no use of accessory muscles.  Cardiac: RRR, precordium non-hyperdynamic. Vascular:   left arm AV access + thrill and bruit Vessel Right Left  Radial Palpable Palpable  Brachial Palpable Palpable  Gastrointestinal: soft, non-distended. No guarding/no peritoneal signs.  Musculoskeletal: M/S 5/5 throughout.  No deformity.  Neurologic: CN 2-12 intact. Pain and light touch intact in extremities.  Symmetrical.  Speech is fluent. Motor exam as listed above. Psychiatric: Judgment intact, Mood & affect appropriate for pt's clinical situation. Dermatologic: No rashes or ulcers noted.  No changes consistent with cellulitis.   CBC Lab Results  Component Value Date   WBC 13.2 (H) 11/25/2018   HGB 10.3 (L) 11/25/2018   HCT 33.8 (L) 11/25/2018   MCV 88.5 11/25/2018   PLT 220 11/25/2018    BMET    Component Value Date/Time   NA 134 (L) 11/25/2018 1507   K 3.9 11/25/2018 1507   CL 99 11/25/2018 1507   CO2 24 11/25/2018 1507   GLUCOSE 276 (H) 11/25/2018 1507   BUN 46 (H) 11/25/2018 1507   CREATININE 3.01 (H) 11/25/2018 1507   CALCIUM 9.6 11/25/2018 1507   GFRNONAA 15 (L) 11/25/2018 1507   GFRAA 18 (L) 11/25/2018 1507   CrCl cannot be calculated (Patient's most recent lab result is older than the maximum 21 days allowed.).  COAG Lab Results  Component Value Date   INR 1.06 08/01/2018    Radiology No results found.   Assessment/Plan 1. Chronic kidney disease, stage V [facility]) Patient currently still has not started dialysis.  While her axis does show decreased flow  volume we will not proceed with intervention at this time.  That is because the patient is not on dialysis and any intervention will likely cause some kidney injury possibly sugar into dialysis.  Otherwise patient's fistula is functioning fine.  We will continue to have the patient follow-up in 1 year.  However the patient is notified that she will be on dialysis she will need to follow-up sooner  - VAS [place] (AVF, AVG); Future  2. Type 2 diabetes mellitus with diabetic nephropathy, without long-term current use of insulin (HCC) Continue hypoglycemic medications as already ordered, these medications have been reviewed and there are no changes at this time.  Hgb A1C to be monitored as already arranged by  primary service   3. Primary hypertension Continue antihypertensive medications as already ordered, these medications have been reviewed and there are no changes at this time.   4. Hyperlipidemia with target LDL less than 70 Continue statin as ordered and reviewed, no changes at this time     Hortencia Pilar, MD  06/29/2022 5:05 PM

## 2022-06-30 ENCOUNTER — Other Ambulatory Visit (INDEPENDENT_AMBULATORY_CARE_PROVIDER_SITE_OTHER): Payer: Self-pay | Admitting: Nurse Practitioner

## 2022-06-30 ENCOUNTER — Encounter (INDEPENDENT_AMBULATORY_CARE_PROVIDER_SITE_OTHER): Payer: Self-pay | Admitting: Vascular Surgery

## 2022-06-30 ENCOUNTER — Ambulatory Visit (INDEPENDENT_AMBULATORY_CARE_PROVIDER_SITE_OTHER): Payer: Medicare Other | Admitting: Vascular Surgery

## 2022-06-30 ENCOUNTER — Ambulatory Visit (INDEPENDENT_AMBULATORY_CARE_PROVIDER_SITE_OTHER): Payer: Medicare Other

## 2022-06-30 VITALS — BP 136/64 | HR 63 | Resp 16 | Wt 161.0 lb

## 2022-06-30 DIAGNOSIS — E785 Hyperlipidemia, unspecified: Secondary | ICD-10-CM | POA: Diagnosis not present

## 2022-06-30 DIAGNOSIS — N186 End stage renal disease: Secondary | ICD-10-CM | POA: Diagnosis not present

## 2022-06-30 DIAGNOSIS — I10 Essential (primary) hypertension: Secondary | ICD-10-CM

## 2022-06-30 DIAGNOSIS — N185 Chronic kidney disease, stage 5: Secondary | ICD-10-CM | POA: Diagnosis not present

## 2022-06-30 DIAGNOSIS — E1121 Type 2 diabetes mellitus with diabetic nephropathy: Secondary | ICD-10-CM | POA: Diagnosis not present

## 2022-07-01 ENCOUNTER — Encounter (INDEPENDENT_AMBULATORY_CARE_PROVIDER_SITE_OTHER): Payer: Self-pay | Admitting: Vascular Surgery

## 2022-08-17 ENCOUNTER — Encounter: Payer: Self-pay | Admitting: Cardiovascular Disease

## 2022-08-17 ENCOUNTER — Ambulatory Visit: Payer: Medicare Other | Attending: Cardiovascular Disease | Admitting: Cardiovascular Disease

## 2022-08-17 VITALS — BP 126/62 | HR 63 | Ht 63.0 in | Wt 163.0 lb

## 2022-08-17 DIAGNOSIS — N184 Chronic kidney disease, stage 4 (severe): Secondary | ICD-10-CM | POA: Diagnosis not present

## 2022-08-17 DIAGNOSIS — I10 Essential (primary) hypertension: Secondary | ICD-10-CM | POA: Diagnosis not present

## 2022-08-17 DIAGNOSIS — E785 Hyperlipidemia, unspecified: Secondary | ICD-10-CM | POA: Insufficient documentation

## 2022-08-17 DIAGNOSIS — G4733 Obstructive sleep apnea (adult) (pediatric): Secondary | ICD-10-CM | POA: Diagnosis not present

## 2022-08-17 DIAGNOSIS — Z9989 Dependence on other enabling machines and devices: Secondary | ICD-10-CM | POA: Diagnosis present

## 2022-08-17 NOTE — Patient Instructions (Signed)
Medication Instructions:  The current medical regimen is effective;  continue present plan and medications.  *If you need a refill on your cardiac medications before your next appointment, please call your pharmacy*   Follow-Up: At [facility], you and your health needs are our priority.  As part of our continuing mission to provide you with exceptional heart care, we have created designated Provider Care Teams.  These Care Teams include your primary Cardiologist (physician) and Advanced Practice Providers (APPs -  Physician Assistants and Nurse Practitioners) who all work together to provide you with the care you need, when you need it.  We recommend signing up for the patient portal called "MyChart".  Sign up information is provided on this After Visit Summary.  MyChart is used to connect with patients for Virtual Visits (Telemedicine).  Patients are able to view lab/test results, encounter notes, upcoming appointments, etc.  Non-urgent messages can be sent to your provider as well.   To learn more about what you can do with MyChart, go to [email or web address].    Your next appointment:   Within 90 days of your new machine (let us know when you receive this)  The format for your next appointment:   In Person  Provider:   Shelva Majestic, MD

## 2022-08-17 NOTE — Progress Notes (Unsigned)
Patient ID: Tara Holmes, female   DOB: 04-Apr-1946, 76 y.o.   MRN: [id number]        Primary M.D.: Dr. Quillian Quince Pomponsini  HPI: Tara Holmes is a 76 y.o. female who presents to the office today for an 14 month follow-up cardiology/sleep evaluation.  Tara Holmes has a long history of hypertension, family history for diabetes mellitus, hyperlipidemia, obesity, and is status post clipping of remote left brain aneurysm. She has a history of obstructive sleep apnea which was originally diagnosed over 10 years ago. This study had been done in [place].  She was able to obtain a new CPAP machine and now has a ResMed air since 10.  [place] to using CPAP with 100% compliance.  Typically she goes to bed between 9 and 9:30 PM.  She believes her sleep is restorative.  She occasionally takes a daytime nap.  She has a history of hyperlipidemia for which she's been on Mevacor 40 mg, and fish oil capsules.  She is no longer taking WelChol.  Her blood pressure has been treated with losartan 100 mg, amlodipine 10 mg, carvedilol 25 mg twice a day and furosemide 40 mg. She is diabetic and now is on glimepiride and Tradjenta.  She was  evaluated at [facility] emergency room for chest pain on 04/02/2017.  Her chest pain was nonexertional and not described as a tightness.  It was felt to be somewhat atypical.  She saw her primary physician in follow-up the following day.  Her pain was described as sharp, which occurred in different locations on her chest nor not associated with radiation to her arms or jaw.  It would last 5 minutes and then dissipate spontaneously.    Since I saw her, she also has been evaluated at [facility] for chronic kidney disease stage IV.  There has been discussion for possible AV fistula formation in the very near future.  At her last evaluation.  Her serum creatinine was 3.6.  She had seen Dr. Lissa Merlin and recently saw Dr. Adonis Housekeeper.   I saw her in April 2018 in follow-up of  her ER evaluation.  I scheduled her for [facility] study which was done on 04/19/2017.  This revealed normal perfusion without scar or ischemia.  Ejection fraction was 55%.  I reviewed her most recent echo which showed LVH with normal systolic function with grade 2 diastolic dysfunction, aortic valve sclerosis without stenosis, and mitral annular calcification with trivial PR.  She has continued to use her CPAP.  She admits to 100% compliance.  I saw her in July 2018.  She developed progressive chronic kidney disease and had undergone insertion of an AV fistula in her left arm bands by Dr. Evelina Bucy.  Has been on amlodipine 5 mg, low 5 mg twice a day, furosemide 40 mg daily but pressure control.  She is diabetic he is on gloimeperide  but is no longer taking Tradjenta.  He used to use CPAP and her DME company is [employer] in [place].  A download obtained in the office today from April 18 through May 04, 2018 demonstrated continued compliance and she was only sleeping 6 hours and 23 minutes with CPAP on a daily basis.  At a auto set up her 95th percentile pressure was16.8 with a maximum average pressure of 18.3.  She was using a fullface mask.  AHI was excellent at 1.3.   She was seen on September 03, 2019 for follow-up evaluation..  Over the prior year, her renal function  had stabilized and she has not required initiation of dialysis.  She is followed by Dr. Osker Mason.Marland Kitchen She was compliant with CPAP therapy but often puts her hair is in curlers approximately every 7 to 10 days and therefore has not been sleeping with the CPAP mask when her hairs are in curlers.  A download was obtained in the office from August 13 through August 30, 2019.  She was compliant for 26 of 30 days with usage in the 4 days which she did not use therapy was when she had curlers in her hair at night.  She was averaging 8 hours and 21 minutes.  AHI is excellent at 1.3 and her CPAP auto settings range from 11 to 20 cm.  Her 95th  percentile pressure is 16.6 with a maximum average pressure of 18.4.  She uses a ResMed full facemask.  An Epworth Sleepiness Scale score was recalculated  and this endorsed at 3 arguing against daytime sleepiness.   She went to the emergency room on October 28, 2019 and was hospitalized overnight at [facility] for atypical chest pain.  Chest x-ray, EKG, serial troponins are unremarkable and she was discharged the following day.  She was concerned about this evaluation and wanted my opinion regarding her assessment.  It was felt most likely her chest pain was musculoskeletal in etiology.  I saw her in December 2020.  At that time she had  improved compliance with her CPAP.  A new download was obtained from November 7 through November 24, 2019 which showed 90% compliance, averaging 7 hours and 55 minutes.  Her CPAP auto was set at a minimum pressure of 11 and maximum of 20, and her 95th percentile pressure was 16.6 with a maximum average pressure of 18.6.  She was sleeping well.    Since I saw her, she has continued to be followed by Dr. Theador Hawthorne for her stage IV chronic kidney disease.  Laboratory in 09/22/2020 showed a hemoglobin of 9.4 hematocrit 30.1.  Creatinine in September 2021 was 3.23 which had increased from June 2021 at 3.0.  She was recently started on Sensipar which she takes on Monday Wednesday and Fridays.  I last saw her in October 05, 2020 at which time she continued to well and denied any chest pain or shortness of breath.  Her AV fistula is 74 years old unfortunately she has not required use.  A download of her CPAP therapy in May 2021 showed she was meeting compliance and AHI was 1.4 with her auto mode of 10-20 with a set 95 percentile pressure of 17.1.  Attempt at obtaining a download did not reveal any data since May.  However she admits to continued use.  It appears that both she and her husband had refused supplies at the end of May since they had adequate supplies already  from the DME company. She sees Dr. Harmon Pier for primary care in [place].  Recent LDL cholesterol was 88 in June 2021 rosuvastatin 10 mg.    I last saw her on October 05, 2020 at which time she continued to do well.  She has continued to be followed by Dr. Theador Hawthorne in [place] for her CKD and was started on Cinacalat 30 mg on Monday Wednesday and Friday.  She is now followed by Dr. Manuella Ghazi eating for primary care.  She is on amlodipine 5 mg, carvedilol 25 mg twice a day, losartan 100 mg daily and furosemide 40 mg daily for hypertension.  She is diabetic  on glimepiride 2 mg.  For her hyperlipidemia she is on Zetia 10 mg, rosuvastatin at 10 mg, in addition to Lovaza 1 g daily.  To use CPAP.  I obtained a new download from her DME company on May 16 through June 01, 2021.  Compliance is excellent averaging 7 hours and 39 minutes.  Her CPAP has been set at a minimum pressure of 12 and maximum of 20.  AHI is 3.0.  She is achieving high pressures with 95th percentile pressure 18.3 and maximum average pressure at 19.1.  She is unaware of breakthrough snoring.  A new Epworth scale was calculated in the office today and this was negative for residual daytime sleepiness calculated at 9.  She presents for evaluation   Past Medical History:  Diagnosis Date   Anemia    Aneurysm ([place]) 1978   Arthritis    Chronic kidney disease    ESRD   Diabetes ([place])    Encephalomalacia    GERD (gastroesophageal reflux disease)    Gout    Head injury 10/20/2020   d/t fall   Heart disease    Hypertension    Mild memory disturbance    Seizures ([place])    Sleep apnea     Past Surgical History:  Procedure Laterality Date   AV FISTULA INSERTION W/ RF MAGNETIC GUIDANCE Left 08/08/2018   Procedure: AV FISTULA INSERTION W/RF MAGNETIC GUIDANCE;  Surgeon: Katha Cabal, MD;  Location: [facility];  Service: Cardiovascular;  Laterality: Left;   BREAST SURGERY Left    fibroid   TONSILLECTOMY      Allergies   Allergen Reactions   Amoxicillin Other (See Comments)    Pt states allergic reaction in vaginal region. Has patient had a PCN reaction causing immediate rash, facial/tongue/throat swelling, SOB or lightheadedness with hypotension: No Has patient had a PCN reaction causing severe rash involving mucus membranes or skin necrosis: No Has patient had a PCN reaction that required hospitalization: No Has patient had a PCN reaction occurring within the last 10 years: No Yeast infection reaction ONLY If all of the above answers are "NO", then may proceed with Ce   Atorvastatin Other (See Comments)    MUSCLE ACHES    Prednisone Other (See Comments)    ELEVATED BLOOD SUGAR   Iodinated Contrast Media Other (See Comments)    PATIENT IS UNAWARE OF THIS ALLERGY OR ANY REACTION TO THIS MEDICATION. Other reaction(s): Other (See Comments) PATIENT IS UNAWARE OF THIS ALLERGY OR ANY REACTION TO THIS MEDICATION.    Current Outpatient Medications  Medication Sig Dispense Refill   ACCU-CHEK COMPACT PLUS test strip CHECK BLOOD SUGAR ONCE A DAY  3   allopurinol (ZYLOPRIM) 100 MG tablet allopurinol 100 mg tablet  Take 1 tablet every day by oral route.     amLODipine (NORVASC) 2.5 MG tablet Take 2.5 mg by mouth daily.     carvedilol (COREG) 25 MG tablet Take 25 mg by mouth 2 (two) times daily.      cholecalciferol (VITAMIN D3) 25 MCG (1000 UNIT) tablet Take 1,000 Units by mouth daily.     cinacalcet (SENSIPAR) 30 MG tablet Take 30 mg by mouth 3 (three) times a week. M W F     ezetimibe (ZETIA) 10 MG tablet Take 10 mg by mouth daily.     ferrous sulfate 325 (65 FE) MG EC tablet Take 325 mg by mouth every evening.  0   furosemide (LASIX) 20 MG tablet Take 20 mg  by mouth 2 (two) times daily.     glimepiride (AMARYL) 2 MG tablet Take 1 mg by mouth daily as needed (elevated blood sugar).      losartan (COZAAR) 100 MG tablet Take 100 mg by mouth daily.     oxybutynin (DITROPAN) 5 MG tablet Take 5 mg by mouth 2  (two) times daily.      rosuvastatin (CRESTOR) 10 MG tablet Take 10 mg by mouth daily.     No current facility-administered medications for this visit.    Social History   Socioeconomic History   Marital status: Married    Spouse name: Percell Miller   Number of children: 2   Years of education: MA   Highest education level: Not on file  Occupational History   Occupation: Retired  Tobacco Use   Smoking status: Never   Smokeless tobacco: Never  Vaping Use   Vaping Use: Never used  Substance and Sexual Activity   Alcohol use: No   Drug use: Never   Sexual activity: Not on file  Other Topics Concern   Not on file  Social History Narrative   Patient lives at home with spouse.   Caffeine Use: none   Social Determinants of Radio broadcast assistant Strain: Not on file  Food Insecurity: Not on file  Transportation Needs: Not on file  Physical Activity: Not on file  Stress: Not on file  Social Connections: Not on file  Intimate Partner Violence: Not on file   Additional social history is notable in that she works as a Therapist, sports.  There is no tobacco, alcohol.  Family History  Problem Relation Age of Onset   Heart attack Mother    Diabetes Mother    Colon cancer Father     ROS General: Negative; No fevers, chills, or night sweats;  HEENT: Negative; No changes in vision or hearing, sinus congestion, difficulty swallowing Pulmonary: Negative; No cough, wheezing, shortness of breath, hemoptysis Cardiovascular: Negative; No chest pain, presyncope, syncope, palpitations  left arm AV fistula GI: Negative; No nausea, vomiting, diarrhea, or abdominal pain GU: Negative; No dysuria, hematuria, or difficulty voiding Musculoskeletal: Negative; no myalgias, joint pain, or weakness Hematologic/Oncology: Negative; no easy bruising, bleeding Endocrine: Negative; no heat/cold intolerance; no diabetes Neuro: Negative; no changes in balance, headaches Skin: Negative; No  rashes or skin lesions Psychiatric: Negative; No behavioral problems, depression Sleep: Positive for obstructive sleep apnea.   Epworth sleepiness score increased at 11 today.  No bruxism, restless legs, hypnogognic hallucinations, no cataplexy Other comprehensive 14 point system review is negative.   PE BP 126/62   Pulse 63   Ht '5\' 3"'  (1.6 m)   Wt 163 lb (73.9 kg)   SpO2 100%   BMI 28.87 kg/m    Repeat blood pressure he was excellent at 122/64  Wt Readings from Last 3 Encounters:  08/17/22 163 lb (73.9 kg)  06/30/22 161 lb (73 kg)  07/01/21 168 lb (76.2 kg)    General: Alert, oriented, no distress.  Skin: normal turgor, no rashes, warm and dry HEENT: Normocephalic, atraumatic. Pupils equal round and reactive to light; sclera anicteric; extraocular muscles intact;  Nose without nasal septal hypertrophy Mouth/Parynx benign; Mallinpatti scale 3 Neck: No JVD, no carotid bruits; normal carotid upstroke Lungs: clear to ausculatation and percussion; no wheezing or rales Chest wall: without tenderness to palpitation Heart: PMI not displaced, RRR, s1 s2 normal, 1/6 systolic murmur, no diastolic murmur, no rubs, gallops, thrills, or heaves Abdomen: soft, nontender; no  hepatosplenomehaly, BS+; abdominal aorta nontender and not dilated by palpation. Back: no CVA tenderness Pulses 2+ Musculoskeletal: full range of motion, normal strength, no joint deformities Extremities: AV fistula left arm; no clubbing cyanosis or edema, Homan's sign negative  Neurologic: grossly nonfocal; Cranial nerves grossly wnl Psychologic: Normal mood and affect  August 17, 2022   ECG (independently read by me): NSR at 63  June 02, 2021 ECG (independently read by me):  NSR at 60; T wave abnormality , no ectopy  October 2021 ECG (independently read by me): NSR at 60; NSSTT changes; normal intervals  November 26, 2019 ECG (independently read by me): Normal sinus rhythm at 63 bpm with sinus arrhythmia.  Normal  intervals.  September 03, 2019 ECG (independently read by me): Normal sinus rhythm at 69 bpm.  Mild T wave abnormality in aVL.  No ectopy.  ECG of 08/01/2018 was reviewed independently by me and shows sinus bradycardia 55 bpm.  There is mild T wave abnormality in leads I and aVL.  July 2018 ECG (independently read by me): Normal sinus rhythm at 61 bpm.  T-wave abnormality in leads 1 and aVL  April 2018 ECG (independently read by me): Normal sinus rhythm at 67 bpm.  Resolution of prior inferolateral T wave inversion, however, there is new T-wave inversion in leads 1 and aVL.  May 2017 ECG (independently read by me): Normal sinus rhythm at 66 bpm.  Inferolateral T wave abnormality.  April 2015 ECG (independently read by me): Normal sinus rhythm 81 beats per minute.  Nonspecific ST changes in leads 1, AVL and V6.   12/31/2013 ECG (independently read by me): Sinus rhythm. Nondiagnostic T changes.  LABS:    Latest Ref Rng & Units 11/25/2018    3:07 PM 08/01/2018   11:21 AM  BMP  Glucose 70 - 99 mg/dL 276  137   BUN 8 - 23 mg/dL 46  54   Creatinine 0.44 - 1.00 mg/dL 3.01  2.84   Sodium 135 - 145 mmol/L 134  142   Potassium 3.5 - 5.1 mmol/L 3.9  4.1   Chloride 98 - 111 mmol/L 99  106   CO2 22 - 32 mmol/L 24  29   Calcium 8.9 - 10.3 mg/dL 9.6  9.6        Latest Ref Rng & Units 11/25/2018    3:07 PM  Hepatic Function  Total Protein 6.5 - 8.1 g/dL 7.5   Albumin 3.5 - 5.0 g/dL 3.0   AST 15 - 41 U/L 14   ALT 0 - 44 U/L 19   Alk Phosphatase 38 - 126 U/L 61   Total Bilirubin 0.3 - 1.2 mg/dL 1.5       Latest Ref Rng & Units 11/25/2018    3:07 PM 08/01/2018   11:21 AM  CBC  WBC 4.0 - 10.5 K/uL 13.2  6.6   Hemoglobin 12.0 - 15.0 g/dL 10.3  10.3   Hematocrit 36.0 - 46.0 % 33.8  30.1   Platelets 150 - 400 K/uL 220  215     BNP No results found for: "PROBNP"  Lipid Panel  No results found for: "CHOL", "TRIG", "HDL", "CHOLHDL", "VLDL", "LDLCALC"   RADIOLOGY: No results  found.  IMPRESSION:  No diagnosis found.    ASSESSMENT AND PLAN: Ms. Longsworth is a 76 year-old African-American female who has a history of diabetes mellitus, hyperlipidemia, hypertension, obstructive sleep apnea and progressive renal insufficiency.  She underwent insertion of an AV fistula in August 2019  but fortunately has not not had to initiate dialysis therapy.  She developed atypical chest pain leading to an overnight hospitalization at [facility] which most likely was musculoskeletal in etiology.  Her blood pressure today continues to be stable on a regimen of amlodipine 5 mg which she is now taking every other day, losartan 100 mg daily, and furosemide 40 mg daily.  She sees Dr. Theador Hawthorne regularly for her knee.  She has not required dialysis.  He has been wearing support stockings with benefit.  She is on Zetia, rosuvastatin 10 mg, and low vase of 4 hyperlipidemia.  Target LDL is less than 70.  She is diabetic on glimepiride 1 mg.  She continues to use CPAP and has excellent compliance.  AHI is stable at 3.0 with her auto setting range from 12 to 20 cm of water.  Since her 95th percentile pressure is 18.3, I am recommending changing her auto pressures to a minimum pressure of 14 and will continue with the maximum pressure of 20.  Her DME company is in [place].  We had not been able to get it linked to our office but we were able to have her bring a download with her today from a recent interrogation on June 01, 2021.  Had COVID infection with cough without fever in May and otherwise was fairly asymptomatic.  She will continue current therapy and follow-up with Dr. Theador Hawthorne.  I will see her the next year when I see her husband as well.    Troy Sine, MD, [facility]  08/17/2022 1:37 PM

## 2022-08-18 ENCOUNTER — Encounter: Payer: Self-pay | Admitting: Cardiovascular Disease

## 2022-08-23 ENCOUNTER — Telehealth: Payer: Self-pay | Admitting: *Deleted

## 2022-08-23 NOTE — Telephone Encounter (Signed)
Order for  replacement CPAP machine sent to [facility] via [facility] portal.

## 2022-10-04 ENCOUNTER — Telehealth: Payer: Self-pay | Admitting: *Deleted

## 2022-10-04 NOTE — Telephone Encounter (Signed)
Received a message from [facility] via [place] portal that they could not fill CPAP order for patient. Requested that I call the office for details. I called and spoke with Myriam Jacobson and was informed the patient received a Airsense 11 on 08/18/21, ordered by Dr Claiborne Billings. The new machine had not been linked in [place]. [facility] shows the [employer]. Myriam Jacobson will have the RT at their [facility] location to link her new machine to Dr Claiborne Billings in [place].

## 2022-10-17 ENCOUNTER — Encounter (INDEPENDENT_AMBULATORY_CARE_PROVIDER_SITE_OTHER): Payer: Self-pay

## 2023-03-16 ENCOUNTER — Encounter: Payer: Self-pay | Admitting: Cardiovascular Disease

## 2023-03-16 ENCOUNTER — Ambulatory Visit: Payer: Medicare Other | Attending: Cardiovascular Disease | Admitting: Cardiovascular Disease

## 2023-03-16 VITALS — BP 130/86 | HR 55 | Ht 63.0 in | Wt 155.0 lb

## 2023-03-16 DIAGNOSIS — N184 Chronic kidney disease, stage 4 (severe): Secondary | ICD-10-CM | POA: Diagnosis present

## 2023-03-16 DIAGNOSIS — E785 Hyperlipidemia, unspecified: Secondary | ICD-10-CM | POA: Diagnosis present

## 2023-03-16 DIAGNOSIS — I10 Essential (primary) hypertension: Secondary | ICD-10-CM | POA: Diagnosis present

## 2023-03-16 DIAGNOSIS — G4733 Obstructive sleep apnea (adult) (pediatric): Secondary | ICD-10-CM | POA: Insufficient documentation

## 2023-03-16 NOTE — Patient Instructions (Addendum)
Medication Instructions:  No changes   *If you need a refill on your cardiac medications before your next appointment, please call your pharmacy*   Lab Work: Not needed    Testing/Procedures:  Not needed  Follow-Up: At [facility], you and your health needs are our priority.  As part of our continuing mission to provide you with exceptional heart care, we have created designated Provider Care Teams.  These Care Teams include your primary Cardiologist (physician) and Advanced Practice Providers (APPs -  Physician Assistants and Nurse Practitioners) who all work together to provide you with the care you need, when you need it.     Your next appointment:   12 month(s)  The format for your next appointment:   In Person  Provider:   Shelva Majestic, MD    Other Instructions  NO  pressure changes were done to your C-PAP

## 2023-03-16 NOTE — Progress Notes (Signed)
Patient ID: Tara Holmes, female   DOB: Jun 11, 1946, 77 y.o.   MRN: [id number]        Primary M.D.: Dr. Monico Blitz in [facility]  HPI: Tara Holmes is a 77 y.o. female who presents to the office today for an 7 month follow-up cardiology/sleep evaluation.  Ms. Golson has a long history of hypertension, family history for diabetes mellitus, hyperlipidemia, obesity, and is status post clipping of remote left brain aneurysm. She has a history of obstructive sleep apnea which was originally diagnosed over 10 years ago. This study had been done in [place].  She was able to obtain a new CPAP machine and now has a ResMed air since 10.  Mitts to using CPAP with 100% compliance.  Typically she goes to bed between 9 and 9:30 PM.  She believes her sleep is restorative.  She occasionally takes a daytime nap.  She has a history of hyperlipidemia for which she's been on Mevacor 40 mg, and fish oil capsules.  She is no longer taking WelChol.  Her blood pressure has been treated with losartan 100 mg, amlodipine 10 mg, carvedilol 25 mg twice a day and furosemide 40 mg. She is diabetic and now is on glimepiride and Tradjenta.  She was  evaluated at [facility] emergency room for chest pain on 04/02/2017.  Her chest pain was nonexertional and not described as a tightness.  It was felt to be somewhat atypical.  She saw her primary physician in follow-up the following day.  Her pain was described as sharp, which occurred in different locations on her chest nor not associated with radiation to her arms or jaw.  It would last 5 minutes and then dissipate spontaneously.    Since I saw her, she also has been evaluated at [facility] for chronic kidney disease stage IV.  There has been discussion for possible AV fistula formation in the very near future.  At her last evaluation.  Her serum creatinine was 3.6.  She had seen Dr. Lissa Merlin and recently saw Dr. Adonis Housekeeper.   I saw her in April 2018 in follow-up  of her ER evaluation.  I scheduled her for [facility] study which was done on 04/19/2017.  This revealed normal perfusion without scar or ischemia.  Ejection fraction was 55%.  I reviewed her most recent echo which showed LVH with normal systolic function with grade 2 diastolic dysfunction, aortic valve sclerosis without stenosis, and mitral annular calcification with trivial PR.  She has continued to use her CPAP.  She admits to 100% compliance.  I saw her in July 2018.  She developed progressive chronic kidney disease and had undergone insertion of an AV fistula in her left arm bands by Dr. Evelina Bucy.  Has been on amlodipine 5 mg, low 5 mg twice a day, furosemide 40 mg daily but pressure control.  She is diabetic he is on gloimeperide  but is no longer taking Tradjenta.  He used to use CPAP and her DME company is [employer] in [place].  A download obtained in the office today from April 18 through May 04, 2018 demonstrated continued compliance and she was only sleeping 6 hours and 23 minutes with CPAP on a daily basis.  At a auto set up her 95th percentile pressure was16.8 with a maximum average pressure of 18.3.  She was using a fullface mask.  AHI was excellent at 1.3.   She was seen on September 03, 2019 for follow-up evaluation..  Over the prior year, her  renal function had stabilized and she has not required initiation of dialysis.  She is followed by Dr. Osker Mason.Marland Kitchen She was compliant with CPAP therapy but often puts her hair is in curlers approximately every 7 to 10 days and therefore has not been sleeping with the CPAP mask when her hairs are in curlers.  A download was obtained in the office from August 13 through August 30, 2019.  She was compliant for 26 of 30 days with usage in the 4 days which she did not use therapy was when she had curlers in her hair at night.  She was averaging 8 hours and 21 minutes.  AHI is excellent at 1.3 and her CPAP auto settings range from 11 to 20 cm.  Her 95th  percentile pressure is 16.6 with a maximum average pressure of 18.4.  She uses a ResMed full facemask.  An Epworth Sleepiness Scale score was recalculated  and this endorsed at 3 arguing against daytime sleepiness.   She went to the emergency room on October 28, 2019 and was hospitalized overnight at [facility] for atypical chest pain.  Chest x-ray, EKG, serial troponins are unremarkable and she was discharged the following day.  She was concerned about this evaluation and wanted my opinion regarding her assessment.  It was felt most likely her chest pain was musculoskeletal in etiology.  I saw her in December 2020.  At that time she had  improved compliance with her CPAP.  A new download was obtained from November 7 through November 24, 2019 which showed 90% compliance, averaging 7 hours and 55 minutes.  Her CPAP auto was set at a minimum pressure of 11 and maximum of 20, and her 95th percentile pressure was 16.6 with a maximum average pressure of 18.6.  She was sleeping well.    Since I saw her, she has continued to be followed by Dr. Theador Hawthorne for her stage IV chronic kidney disease.  Laboratory in 09/22/2020 showed a hemoglobin of 9.4 hematocrit 30.1.  Creatinine in September 2021 was 3.23 which had increased from June 2021 at 3.0.  She was recently started on Sensipar which she takes on Monday Wednesday and Fridays.  I last saw her in October 05, 2020 at which time she continued to well and denied any chest pain or shortness of breath.  Her AV fistula is 75 years old unfortunately she has not required use.  A download of her CPAP therapy in May 2021 showed she was meeting compliance and AHI was 1.4 with her auto mode of 10-20 with a set 95 percentile pressure of 17.1.  Attempt at obtaining a download did not reveal any data since May.  However she admits to continued use.  It appears that both she and her husband had refused supplies at the end of May since they had adequate supplies already  from the DME company. She sees Dr. Harmon Pier for primary care in [place].  Recent LDL cholesterol was 88 in June 2021 rosuvastatin 10 mg.    I saw her on October 05, 2020 at which time she continued to do well.  She has continued to be followed by Dr. Theador Hawthorne in [place] for her CKD and was started on Cinacalat 30 mg on Monday Wednesday and Friday.  She is now followed by Dr. Manuella Ghazi in [place] for primary care.    When I saw her on June 02, 2021 she felt well and was on  amlodipine 5 mg, carvedilol 25 mg twice a  day, losartan 100 mg daily and furosemide 40 mg daily for hypertension.  She is diabetic on glimepiride 2 mg.  For her hyperlipidemia she is on Zetia 10 mg, rosuvastatin at 10 mg, in addition to Lovaza 1 g daily.  She continues to use CPAP and I obtained a new download from her DME company on May 16 through June 01, 2021.  Compliance is excellent averaging 7 hours and 39 minutes.  Her CPAP has been set at a minimum pressure of 12 and maximum of 20.  AHI is 3.0.  She is achieving high pressures with 95th percentile pressure 18.3 and maximum average pressure at 19.1.  She is unaware of breakthrough snoring.  A new Epworth scale was calculated in the office today and this was negative for residual daytime sleepiness calculated at 9.  During that evaluation, I recommended changing her auto pressure to a minimum pressure of 14 with maximum of 20.  Her DME company is [employer] in [place].  I last saw her on August 17, 2022.  Since I last saw her, we were able to link her machine to our office.  She denies any chest pain or shortness of breath.  She has improved her diet.  Her CKD has been stable with most recent serum creatinine on August 02, 2022 at 2.77 consistent with stage IV CKD.  Her AV fistula in her left arm has never been used and has been in place for 3 years.  She sees Dr. Hortencia Pilar the vascular surgeon at [facility] on a yearly basis.  She continues to use CPAP  and admits to excellent compliance.  Her machine is no longer downloadable due to 5G upgrade.  She states she gets her hair done every 2 weeks and uses CPAP 100% of the time with the exception of for 2 days when she gets her hair done when she has curlers in her hair.  As result she averages 26 out of 30 nights of CPAP use per month.  She is sleeping well.  She is unaware of breakthrough snoring.  She denies residual daytime sleepiness.    Ms. Azuara received a new replacement CPAP AirSense 11 AutoSet unit on August 18, 2022.  Her initial download from August 31 through September 16, 2022 has verified compliance with average use at 6 hours and 48 minutes per night.  Her DME company is [employer] in [place].  She uses her CPAP every night with the exception of when she gets her hair done and has curlers in her hair.  On this download usage was 26 of 30 days.  At a pressure range of 12 to 20 cm of water, AHI was 3.6.  Her 95th percentile pressure was 17.9 and her maximum average pressure was 18.9/h.  Presently, she feels well.  She denies chest pain or shortness of breath.  She continues to be followed by Dr. Theador Hawthorne for her kidney disease.  Most recent creatinine had increased to 2.95 on November 22, 2022.  She denies chest pain.  At times there is some trace leg swelling.  She continues to see Dr.Shah in [place] for primary care.  She presents for evaluation.   Past Medical History:  Diagnosis Date   Anemia    Aneurysm ([place]) 1978   Arthritis    Chronic kidney disease    ESRD   Diabetes ([place])    Encephalomalacia    GERD (gastroesophageal reflux disease)    Gout    Head  injury 10/20/2020   d/t fall   Heart disease    Hypertension    Mild memory disturbance    Seizures ([place])    Sleep apnea     Past Surgical History:  Procedure Laterality Date   AV FISTULA INSERTION W/ RF MAGNETIC GUIDANCE Left 08/08/2018   Procedure: AV FISTULA INSERTION W/RF MAGNETIC GUIDANCE;  Surgeon: Katha Cabal, MD;   Location: [facility];  Service: Cardiovascular;  Laterality: Left;   BREAST SURGERY Left    fibroid   TONSILLECTOMY      Allergies  Allergen Reactions   Amoxicillin Other (See Comments)    Pt states allergic reaction in vaginal region. Has patient had a PCN reaction causing immediate rash, facial/tongue/throat swelling, SOB or lightheadedness with hypotension: No Has patient had a PCN reaction causing severe rash involving mucus membranes or skin necrosis: No Has patient had a PCN reaction that required hospitalization: No Has patient had a PCN reaction occurring within the last 10 years: No Yeast infection reaction ONLY If all of the above answers are "NO", then may proceed with Ce   Atorvastatin Other (See Comments)    MUSCLE ACHES    Prednisone Other (See Comments)    ELEVATED BLOOD SUGAR   Iodinated Contrast Media Other (See Comments)    PATIENT IS UNAWARE OF THIS ALLERGY OR ANY REACTION TO THIS MEDICATION. Other reaction(s): Other (See Comments) PATIENT IS UNAWARE OF THIS ALLERGY OR ANY REACTION TO THIS MEDICATION.    Current Outpatient Medications  Medication Sig Dispense Refill   ACCU-CHEK COMPACT PLUS test strip CHECK BLOOD SUGAR ONCE A DAY  3   amLODipine (NORVASC) 2.5 MG tablet Take 2.5 mg by mouth daily.     carvedilol (COREG) 25 MG tablet Take 25 mg by mouth 2 (two) times daily.      cholecalciferol (VITAMIN D3) 25 MCG (1000 UNIT) tablet Take 1,000 Units by mouth daily.     cinacalcet (SENSIPAR) 30 MG tablet Take 30 mg by mouth 3 (three) times a week. M W F     ezetimibe (ZETIA) 10 MG tablet Take 10 mg by mouth daily.     ferrous sulfate 325 (65 FE) MG EC tablet Take 325 mg by mouth every evening.  0   furosemide (LASIX) 20 MG tablet Take 20 mg by mouth 2 (two) times daily.     glimepiride (AMARYL) 2 MG tablet Take 1 mg by mouth daily as needed (elevated blood sugar).      losartan (COZAAR) 100 MG tablet Take 100 mg by mouth daily.     oxybutynin  (DITROPAN) 5 MG tablet Take 5 mg by mouth 2 (two) times daily.      rosuvastatin (CRESTOR) 10 MG tablet Take 10 mg by mouth daily.     No current facility-administered medications for this visit.    Social History   Socioeconomic History   Marital status: Married    Spouse name: Percell Miller   Number of children: 2   Years of education: MA   Highest education level: Not on file  Occupational History   Occupation: Retired  Tobacco Use   Smoking status: Never   Smokeless tobacco: Never  Vaping Use   Vaping Use: Never used  Substance and Sexual Activity   Alcohol use: No   Drug use: Never   Sexual activity: Not on file  Other Topics Concern   Not on file  Social History Narrative   Patient lives at home with spouse.  Caffeine Use: none   Social Determinants of Radio broadcast assistant Strain: Not on file  Food Insecurity: Not on file  Transportation Needs: Not on file  Physical Activity: Not on file  Stress: Not on file  Social Connections: Not on file  Intimate Partner Violence: Not on file   Additional social history is notable in that she worked as a Therapist, sports.  There is no tobacco, alcohol.  Family History  Problem Relation Age of Onset   Heart attack Mother    Diabetes Mother    Colon cancer Father     ROS General: Negative; No fevers, chills, or night sweats;  HEENT: Negative; No changes in vision or hearing, sinus congestion, difficulty swallowing Pulmonary: Negative; No cough, wheezing, shortness of breath, hemoptysis Cardiovascular: Negative; No chest pain, presyncope, syncope, palpitations  left arm AV fistula GI: Negative; No nausea, vomiting, diarrhea, or abdominal pain GU: Negative; No dysuria, hematuria, or difficulty voiding Musculoskeletal: Negative; no myalgias, joint pain, or weakness Hematologic/Oncology: Negative; no easy bruising, bleeding Endocrine: Negative; no heat/cold intolerance; no diabetes Neuro: Negative; no changes  in balance, headaches Skin: Negative; No rashes or skin lesions Psychiatric: Negative; No behavioral problems, depression Sleep: Positive for obstructive sleep apnea.  No bruxism, restless legs, hypnogognic hallucinations, no cataplexy Other comprehensive 14 point system review is negative.   PE BP 130/86   Pulse (!) 55   Ht 5\' 3"  (1.6 m)   Wt 155 lb (70.3 kg)   SpO2 98%   BMI 27.46 kg/m    Repeat blood pressure was 128/80  Wt Readings from Last 3 Encounters:  03/16/23 155 lb (70.3 kg)  08/17/22 163 lb (73.9 kg)  06/30/22 161 lb (73 kg)    General: Alert, oriented, no distress.  Skin: normal turgor, no rashes, warm and dry HEENT: Normocephalic, atraumatic. Pupils equal round and reactive to light; sclera anicteric; extraocular muscles intact;  Nose without nasal septal hypertrophy Mouth/Parynx benign; Mallinpatti scale 4 Neck: No JVD, no carotid bruits; normal carotid upstroke Lungs: clear to ausculatation and percussion; no wheezing or rales Chest wall: without tenderness to palpitation Heart: PMI not displaced, RRR, s1 s2 normal, 1/6 systolic murmur, no diastolic murmur, no rubs, gallops, thrills, or heaves Abdomen: soft, nontender; no hepatosplenomehaly, BS+; abdominal aorta nontender and not dilated by palpation. Back: no CVA tenderness Pulses 2+ Musculoskeletal: full range of motion, normal strength, no joint deformities Extremities: Trace ankle edema; no clubbing cyanosis, Homan's sign negative  Neurologic: grossly nonfocal; Cranial nerves grossly wnl Psychologic: Normal mood and affect   March 16, 2023 ECG (independently read by me):   August 17, 2022   ECG (independently read by me): NSR at 63  June 02, 2021 ECG (independently read by me):  NSR at 60; T wave abnormality , no ectopy  October 2021 ECG (independently read by me): NSR at 60; NSSTT changes; normal intervals  November 26, 2019 ECG (independently read by me): Normal sinus rhythm at 63 bpm with sinus  arrhythmia.  Normal intervals.  September 03, 2019 ECG (independently read by me): Normal sinus rhythm at 69 bpm.  Mild T wave abnormality in aVL.  No ectopy.  ECG of 08/01/2018 was reviewed independently by me and shows sinus bradycardia 55 bpm.  There is mild T wave abnormality in leads I and aVL.  July 2018 ECG (independently read by me): Normal sinus rhythm at 61 bpm.  T-wave abnormality in leads 1 and aVL  April 2018 ECG (independently read by me): Normal  sinus rhythm at 67 bpm.  Resolution of prior inferolateral T wave inversion, however, there is new T-wave inversion in leads 1 and aVL.  May 2017 ECG (independently read by me): Normal sinus rhythm at 66 bpm.  Inferolateral T wave abnormality.  April 2015 ECG (independently read by me): Normal sinus rhythm 81 beats per minute.  Nonspecific ST changes in leads 1, AVL and V6.   12/31/2013 ECG (independently read by me): Sinus rhythm. Nondiagnostic T changes.  LABS:    Latest Ref Rng & Units 11/25/2018    3:07 PM 08/01/2018   11:21 AM  BMP  Glucose 70 - 99 mg/dL 276  137   BUN 8 - 23 mg/dL 46  54   Creatinine 0.44 - 1.00 mg/dL 3.01  2.84   Sodium 135 - 145 mmol/L 134  142   Potassium 3.5 - 5.1 mmol/L 3.9  4.1   Chloride 98 - 111 mmol/L 99  106   CO2 22 - 32 mmol/L 24  29   Calcium 8.9 - 10.3 mg/dL 9.6  9.6        Latest Ref Rng & Units 11/25/2018    3:07 PM  Hepatic Function  Total Protein 6.5 - 8.1 g/dL 7.5   Albumin 3.5 - 5.0 g/dL 3.0   AST 15 - 41 U/L 14   ALT 0 - 44 U/L 19   Alk Phosphatase 38 - 126 U/L 61   Total Bilirubin 0.3 - 1.2 mg/dL 1.5       Latest Ref Rng & Units 11/25/2018    3:07 PM 08/01/2018   11:21 AM  CBC  WBC 4.0 - 10.5 K/uL 13.2  6.6   Hemoglobin 12.0 - 15.0 g/dL 10.3  10.3   Hematocrit 36.0 - 46.0 % 33.8  30.1   Platelets 150 - 400 K/uL 220  215     BNP No results found for: "PROBNP"  Lipid Panel  No results found for: "CHOL", "TRIG", "HDL", "CHOLHDL", "VLDL", "LDLCALC"   RADIOLOGY: No  results found.  IMPRESSION:  1. OSA on CPAP   2. Essential hypertension   3. Hyperlipidemia with target LDL less than 70   4. Chronic kidney disease (CKD) stage G4/A2, severely decreased glomerular filtration rate (GFR) between 15-29 mL/min/1.73 square meter and albuminuria creatinine ratio between 30-299 mg/g [facility])     ASSESSMENT AND PLAN: Ms. Ravens is a 77 year-old African-American female who has a history of diabetes mellitus, hyperlipidemia, hypertension, obstructive sleep apnea and progressive stage IV CKD.  She underwent insertion of an AV fistula in August 2019 but fortunately has not not had to initiate dialysis therapy.  She developed atypical chest pain leading to an overnight hospitalization at [facility] which most likely was musculoskeletal in etiology.  Her blood pressure today is stable on her regimen of amlodipine 2.5 mg, carvedilol 25 mg twice a day and losartan 100 mg daily.  She has had her left arm AV fistula in place for 3 years and fortunately she has not yet had to use this.  Her most recent renal function is stable with a creatinine of 2.77 on July 26, 2022 with GFR estimate at 18.  She continues to be on Zetia 10 mg and rosuvastatin 10 mg for hyperlipidemia.  Dr. Manuella Ghazi has been checking laboratory.  She is diabetic and previously was on glimepiride.  She admits to continued CPAP use on a daily basis except for 2 nights every 2 weeks when she gets her hair done and there  are rollers in her hair.  [employer] in [place] is her DME company.  Since her last evaluation, she received a new ResMed AirSense 11 AutoSet unit on August 18, 2022.  She is meeting compliance standards with usage every night with the exception of the days she has currently generic air.  She met compliance standards on the first month of therapy.  Her most recent download from February 27 through March 15, 2023 reveals an AHI at 4.2 with her 95th percentile pressure at 19.4 and maximum average pressure at  19.6.  I will change her pressure settings which currently are at 12 to 20 cm of water to a range of 15 to 20 cm of water which should improve her AHI.  She is followed closely by Dr. Theador Hawthorne in [place] for her stage IV CKD with most recent creatinine elevated to 2.95 given estimated creatinine clearance at 16.  Dr. Manuella Ghazi in [place] is her primary physician who checks laboratory.  She does have some trace edema of her ankles.  I have suggested she wear compression stockings.  She has diet-controlled prediabetes.  She will be undergoing follow-up laboratory with her primary physician.  I will see her in 1 year for reevaluation or sooner as needed.   Troy Sine, MD, [facility]  03/22/2023 9:36 AM

## 2023-03-22 ENCOUNTER — Encounter: Payer: Self-pay | Admitting: Cardiovascular Disease

## 2023-06-26 ENCOUNTER — Other Ambulatory Visit (INDEPENDENT_AMBULATORY_CARE_PROVIDER_SITE_OTHER): Payer: Self-pay | Admitting: Vascular Surgery

## 2023-06-26 DIAGNOSIS — N186 End stage renal disease: Secondary | ICD-10-CM

## 2023-07-01 NOTE — Progress Notes (Signed)
MRN : [id number]  Tara Holmes is a 77 y.o. (29-Apr-1946) female who presents with chief complaint of check access.  History of Present Illness:   The patient currently does not use access as she has not yet begun dialysis.  The patient returns to the office for followup of their dialysis access.  She was previously seen in the office June 30, 2022.  The function of the access has been stable.    The patient denies redness or swelling at the access site. The patient denies fever or chills at home. The patient has a flow volume of 1385.  There is no focal velocity increase or internal stenosis noted.  The radial and ulnar arteries have antegrade biphasic flow noted.  Duplex ultrasound of the AV access shows a patent access with uniform velocities.  No focal hemodynamically significant stenosis.   Flow volume today is 538 cc/min (previous flow volume was 1385 cc/min)  Labs 05/24/2023: BUN/Cr  47/2024  GFR=23  No outpatient medications have been marked as taking for the 07/03/23 encounter (Appointment) with Gilda Crease, Latina Craver, MD.    Past Medical History:  Diagnosis Date   Anemia    Aneurysm (HCC) 1978   Arthritis    Chronic kidney disease    ESRD   Diabetes (HCC)    Encephalomalacia    GERD (gastroesophageal reflux disease)    Gout    Head injury 10/20/2020   d/t fall   Heart disease    Hypertension    Mild memory disturbance    Seizures (HCC)    Sleep apnea     Past Surgical History:  Procedure Laterality Date   AV FISTULA INSERTION W/ RF MAGNETIC GUIDANCE Left 08/08/2018   Procedure: AV FISTULA INSERTION W/RF MAGNETIC GUIDANCE;  Surgeon: Renford Dills, MD;  Location: [facility] INVASIVE CV LAB;  Service: Cardiovascular;  Laterality: Left;   BREAST SURGERY Left    fibroid   TONSILLECTOMY      Social History Social History   Tobacco Use   Smoking status: Never   Smokeless tobacco: Never  Vaping Use   Vaping status: Never Used   Substance Use Topics   Alcohol use: No   Drug use: Never    Family History Family History  Problem Relation Age of Onset   Heart attack Mother    Diabetes Mother    Colon cancer Father     Allergies  Allergen Reactions   Amoxicillin Other (See Comments)    Pt states allergic reaction in vaginal region. Has patient had a PCN reaction causing immediate rash, facial/tongue/throat swelling, SOB or lightheadedness with hypotension: No Has patient had a PCN reaction causing severe rash involving mucus membranes or skin necrosis: No Has patient had a PCN reaction that required hospitalization: No Has patient had a PCN reaction occurring within the last 10 years: No Yeast infection reaction ONLY If all of the above answers are "NO", then may proceed with Ce   Atorvastatin Other (See Comments)    MUSCLE ACHES    Prednisone Other (See Comments)    ELEVATED BLOOD SUGAR   Iodinated Contrast Media Other (See Comments)    PATIENT IS UNAWARE OF THIS ALLERGY OR ANY REACTION TO THIS MEDICATION. Other reaction(s): Other (See Comments) PATIENT IS UNAWARE OF THIS ALLERGY OR ANY REACTION TO THIS MEDICATION.     REVIEW OF SYSTEMS (Negative unless checked)  Constitutional: [] Weight loss  [] Fever  [] Chills Cardiac: [] Chest pain   [] Chest pressure   [] Palpitations   [] Shortness of breath when laying flat   [] Shortness of breath with exertion. Vascular:  [] Pain in legs with walking   [] Pain in legs at rest  [] History of DVT   [] Phlebitis   [] Swelling in legs   [] Varicose veins   [] Non-healing ulcers Pulmonary:   [] Uses home oxygen   [] Productive cough   [] Hemoptysis   [] Wheeze  [] COPD   [] Asthma Neurologic:  [] Dizziness   [] Seizures   [] History of stroke   [] History of TIA  [] Aphasia   [] Vissual changes   [] Weakness or numbness in arm   [] Weakness or numbness in leg Musculoskeletal:   [] Joint swelling   [] Joint pain   [] Low back pain Hematologic:  [] Easy bruising  [] Easy bleeding    [] Hypercoagulable state   [] Anemic Gastrointestinal:  [] Diarrhea   [] Vomiting  [] Gastroesophageal reflux/heartburn   [] Difficulty swallowing. Genitourinary:  [x] Chronic kidney disease   [] Difficult urination  [] Frequent urination   [] Blood in urine Skin:  [] Rashes   [] Ulcers  Psychological:  [] History of anxiety   []  History of major depression.  Physical Examination  There were no vitals filed for this visit. There is no height or weight on file to calculate BMI. Gen: WD/WN, NAD Head: [place]/AT, No temporalis wasting.  Ear/Nose/Throat: Hearing grossly intact, nares w/o erythema or drainage Eyes: PER, EOMI, sclera nonicteric.  Neck: Supple, no gross masses or lesions.  No JVD.  Pulmonary:  Good air movement, no audible wheezing, no use of accessory muscles.  Cardiac: RRR, precordium non-hyperdynamic. Vascular:   soft thrill left arm at antecubital fossa  good bruit Vessel Right Left  Radial Palpable Palpable  Brachial Palpable Palpable  Gastrointestinal: soft, non-distended. No guarding/no peritoneal signs.  Musculoskeletal: M/S 5/5 throughout.  No deformity.  Neurologic: CN 2-12 intact. Pain and light touch intact in extremities.  Symmetrical.  Speech is fluent. Motor exam as listed above. Psychiatric: Judgment intact, Mood & affect appropriate for pt's clinical situation. Dermatologic: No rashes or ulcers noted.  No changes consistent with cellulitis.   CBC Lab Results  Component Value Date   WBC 13.2 (H) 11/25/2018   HGB 10.3 (L) 11/25/2018   HCT 33.8 (L) 11/25/2018   MCV 88.5 11/25/2018   PLT 220 11/25/2018    BMET    Component Value Date/Time   NA 134 (L) 11/25/2018 1507   K 3.9 11/25/2018 1507   CL 99 11/25/2018 1507   CO2 24 11/25/2018 1507   GLUCOSE 276 (H) 11/25/2018 1507   BUN 46 (H) 11/25/2018 1507   CREATININE 3.01 (H) 11/25/2018 1507   CALCIUM 9.6 11/25/2018 1507   GFRNONAA 15 (L) 11/25/2018 1507   GFRAA 18 (L) 11/25/2018 1507   CrCl cannot be calculated  (Patient's most recent lab result is older than the maximum 21 days allowed.).  COAG Lab Results  Component Value Date   INR 1.06 08/01/2018    Radiology No results found.   Assessment/Plan 1. Chronic kidney disease, stage V [facility]) Patient currently still has not started dialysis.  While her axis does show decreased flow volume we will not proceed with intervention at this time.  That is because the patient is not on dialysis and any intervention will likely cause some kidney injury possibly sugar into dialysis.  Otherwise patient's fistula is functioning fine.  We will continue to have the patient follow-up in 1 year.  However the patient is notified that  she will be on dialysis she will need to follow-up sooner  - VAS US DUPLEX DIALYSIS ACCESS (AVF, AVG); Future  2. Hyperlipidemia with target LDL less than 70 Continue statin as ordered and reviewed, no changes at this time  3. Type 2 diabetes mellitus with diabetic nephropathy, without long-term current use of insulin (HCC) Continue hypoglycemic medications as already ordered, these medications have been reviewed and there are no changes at this time.  Hgb A1C to be monitored as already arranged by primary service  4. Primary hypertension Continue antihypertensive medications as already ordered, these medications have been reviewed and there are no changes at this time.    Levora Dredge, MD  07/01/2023 3:29 PM

## 2023-07-03 ENCOUNTER — Ambulatory Visit (INDEPENDENT_AMBULATORY_CARE_PROVIDER_SITE_OTHER): Payer: Medicare Other

## 2023-07-03 ENCOUNTER — Ambulatory Visit (INDEPENDENT_AMBULATORY_CARE_PROVIDER_SITE_OTHER): Payer: Medicare Other | Admitting: Vascular Surgery

## 2023-07-03 VITALS — BP 128/74 | HR 63 | Resp 17 | Ht 63.0 in | Wt 144.8 lb

## 2023-07-03 DIAGNOSIS — N186 End stage renal disease: Secondary | ICD-10-CM | POA: Diagnosis not present

## 2023-07-03 DIAGNOSIS — E1121 Type 2 diabetes mellitus with diabetic nephropathy: Secondary | ICD-10-CM | POA: Diagnosis not present

## 2023-07-03 DIAGNOSIS — E785 Hyperlipidemia, unspecified: Secondary | ICD-10-CM

## 2023-07-03 DIAGNOSIS — N185 Chronic kidney disease, stage 5: Secondary | ICD-10-CM | POA: Diagnosis not present

## 2023-07-03 DIAGNOSIS — I10 Essential (primary) hypertension: Secondary | ICD-10-CM | POA: Diagnosis not present

## 2023-07-11 ENCOUNTER — Encounter (INDEPENDENT_AMBULATORY_CARE_PROVIDER_SITE_OTHER): Payer: Self-pay | Admitting: Vascular Surgery

## 2023-08-23 ENCOUNTER — Other Ambulatory Visit: Payer: Self-pay

## 2023-08-23 DIAGNOSIS — D649 Anemia, unspecified: Secondary | ICD-10-CM

## 2023-08-23 NOTE — Progress Notes (Signed)
[facility] [address] [place] [address]   Clinic Day:  08/23/2023  Referring physician: Kirstie Peri, MD  Patient Care Team: Kirstie Peri, MD as PCP - General (Internal Medicine) Lennette Bihari, MD as PCP - Cardiology (Cardiology)   ASSESSMENT & PLAN:   Assessment: ***  Plan: ***  No orders of the defined types were placed in this encounter.     Alben Deeds Teague,acting as a Neurosurgeon for Doreatha Massed, MD.,have documented all relevant documentation on the behalf of Doreatha Massed, MD,as directed by  Doreatha Massed, MD while in the presence of Doreatha Massed, MD.   ***  [place] R Teague   9/4/20249:24 PM  CHIEF COMPLAINT/PURPOSE OF CONSULT:   Diagnosis: ***  Current Therapy:  ***  HISTORY OF PRESENT ILLNESS:   Tara Holmes is a 77 y.o. female presenting to clinic today for evaluation of anemia at the request of Kirstie Peri, MD.  Today, she states that she is doing well overall. Her appetite level is at ***%. Her energy level is at ***%.  She receives Epogen injections.   ***She was found to have abnormal CBC from 07/24/23 with low RBC at 3.31, low HGB at 9.5, low HCT at 30.0, and low MCHC at 31.7.   Of note, she has CKD.   ***She denies recent chest pain on exertion, shortness of breath on minimal exertion, pre-syncopal episodes, or palpitations. ***She had not noticed any recent bleeding such as epistaxis, hematuria or hematochezia ***The patient denies over the counter NSAID ingestion. She is not *** on antiplatelets agents.  Her last colonoscopy was 04/22/21.   ***She had no prior history or diagnosis of cancer. She denies any family history of cancer.  *** Her age appropriate screening programs are up-to-date. ***She denies any pica and eats a variety of diet. ***She never donated blood or received blood transfusion. ***The patient was prescribed oral iron supplements and she takes ***  PAST MEDICAL HISTORY:    Past Medical History: Past Medical History:  Diagnosis Date   Anemia    Aneurysm (HCC) 1978   Arthritis    Chronic kidney disease    ESRD   Diabetes (HCC)    Encephalomalacia    GERD (gastroesophageal reflux disease)    Gout    Head injury 10/20/2020   d/t fall   Heart disease    Hypertension    Mild memory disturbance    Seizures (HCC)    Sleep apnea     Surgical History: Past Surgical History:  Procedure Laterality Date   AV FISTULA INSERTION W/ RF MAGNETIC GUIDANCE Left 08/08/2018   Procedure: AV FISTULA INSERTION W/RF MAGNETIC GUIDANCE;  Surgeon: Renford Dills, MD;  Location: [facility] INVASIVE CV LAB;  Service: Cardiovascular;  Laterality: Left;   BREAST SURGERY Left    fibroid   TONSILLECTOMY      Social History: Social History   Socioeconomic History   Marital status: Married    Spouse name: Ramon Dredge   Number of children: 2   Years of education: MA   Highest education level: Not on file  Occupational History   Occupation: Retired  Tobacco Use   Smoking status: Never   Smokeless tobacco: Never  Vaping Use   Vaping status: Never Used  Substance and Sexual Activity   Alcohol use: No   Drug use: Never   Sexual activity: Not on file  Other Topics Concern   Not on file  Social History Narrative   Patient lives  at home with spouse.   Caffeine Use: none   Social Determinants of Corporate investment banker Strain: Not on file  Food Insecurity: Not on file  Transportation Needs: Not on file  Physical Activity: Not on file  Stress: Not on file  Social Connections: Not on file  Intimate Partner Violence: Not on file    Family History: Family History  Problem Relation Age of Onset   Heart attack Mother    Diabetes Mother    Colon cancer Father     Current Medications:  Current Outpatient Medications:    ACCU-CHEK COMPACT PLUS test strip, CHECK BLOOD SUGAR ONCE A DAY, Disp: , Rfl: 3   amLODipine (NORVASC) 2.5 MG tablet, Take 2.5 mg by mouth  daily., Disp: , Rfl:    carvedilol (COREG) 25 MG tablet, Take 25 mg by mouth 2 (two) times daily. , Disp: , Rfl:    cholecalciferol (VITAMIN D3) 25 MCG (1000 UNIT) tablet, Take 1,000 Units by mouth daily., Disp: , Rfl:    cinacalcet (SENSIPAR) 30 MG tablet, Take 30 mg by mouth 3 (three) times a week. M W F, Disp: , Rfl:    ezetimibe (ZETIA) 10 MG tablet, Take 10 mg by mouth daily., Disp: , Rfl:    ferrous sulfate 325 (65 FE) MG EC tablet, Take 325 mg by mouth every evening., Disp: , Rfl: 0   furosemide (LASIX) 20 MG tablet, Take 20 mg by mouth 2 (two) times daily., Disp: , Rfl:    glimepiride (AMARYL) 2 MG tablet, Take 1 mg by mouth daily as needed (elevated blood sugar).  (Patient not taking: Reported on 07/03/2023), Disp: , Rfl:    losartan (COZAAR) 100 MG tablet, Take 100 mg by mouth daily., Disp: , Rfl:    oxybutynin (DITROPAN) 5 MG tablet, Take 5 mg by mouth 2 (two) times daily. , Disp: , Rfl:    rosuvastatin (CRESTOR) 10 MG tablet, Take 10 mg by mouth daily., Disp: , Rfl:    Allergies: Allergies  Allergen Reactions   Amoxicillin Other (See Comments)    Pt states allergic reaction in vaginal region. Has patient had a PCN reaction causing immediate rash, facial/tongue/throat swelling, SOB or lightheadedness with hypotension: No Has patient had a PCN reaction causing severe rash involving mucus membranes or skin necrosis: No Has patient had a PCN reaction that required hospitalization: No Has patient had a PCN reaction occurring within the last 10 years: No Yeast infection reaction ONLY If all of the above answers are "NO", then may proceed with Ce   Atorvastatin Other (See Comments)    MUSCLE ACHES    Prednisone Other (See Comments)    ELEVATED BLOOD SUGAR   Iodinated Contrast Media Other (See Comments)    PATIENT IS UNAWARE OF THIS ALLERGY OR ANY REACTION TO THIS MEDICATION. Other reaction(s): Other (See Comments) PATIENT IS UNAWARE OF THIS ALLERGY OR ANY REACTION TO THIS  MEDICATION.    REVIEW OF SYSTEMS:   Review of Systems  Constitutional:  Negative for chills, fatigue and fever.  HENT:   Negative for lump/mass, mouth sores, nosebleeds, sore throat and trouble swallowing.   Eyes:  Negative for eye problems.  Respiratory:  Negative for cough and shortness of breath.   Cardiovascular:  Negative for chest pain, leg swelling and palpitations.  Gastrointestinal:  Negative for abdominal pain, constipation, diarrhea, nausea and vomiting.  Genitourinary:  Negative for bladder incontinence, difficulty urinating, dysuria, frequency, hematuria and nocturia.   Musculoskeletal:  Negative for arthralgias,  back pain, flank pain, myalgias and neck pain.  Skin:  Negative for itching and rash.  Neurological:  Negative for dizziness, headaches and numbness.  Hematological:  Does not bruise/bleed easily.  Psychiatric/Behavioral:  Negative for depression, sleep disturbance and suicidal ideas. The patient is not nervous/anxious.   All other systems reviewed and are negative.    VITALS:   There were no vitals taken for this visit.  Wt Readings from Last 3 Encounters:  07/03/23 144 lb 12.8 oz (65.7 kg)  03/16/23 155 lb (70.3 kg)  08/17/22 163 lb (73.9 kg)    There is no height or weight on file to calculate BMI.   PHYSICAL EXAM:   Physical Exam Vitals and nursing note reviewed. Exam conducted with a chaperone present.  Constitutional:      Appearance: Normal appearance.  Cardiovascular:     Rate and Rhythm: Normal rate and regular rhythm.     Pulses: Normal pulses.     Heart sounds: Normal heart sounds.  Pulmonary:     Effort: Pulmonary effort is normal.     Breath sounds: Normal breath sounds.  Abdominal:     Palpations: Abdomen is soft. There is no hepatomegaly, splenomegaly or mass.     Tenderness: There is no abdominal tenderness.  Musculoskeletal:     Right lower leg: No edema.     Left lower leg: No edema.  Lymphadenopathy:     Cervical: No  cervical adenopathy.     Right cervical: No superficial, deep or posterior cervical adenopathy.    Left cervical: No superficial, deep or posterior cervical adenopathy.     Upper Body:     Right upper body: No supraclavicular or axillary adenopathy.     Left upper body: No supraclavicular or axillary adenopathy.  Neurological:     General: No focal deficit present.     Mental Status: She is alert and oriented to person, place, and time.  Psychiatric:        Mood and Affect: Mood normal.        Behavior: Behavior normal.     LABS:      Latest Ref Rng & Units 11/25/2018    3:07 PM 08/01/2018   11:21 AM  CBC  WBC 4.0 - 10.5 K/uL 13.2  6.6   Hemoglobin 12.0 - 15.0 g/dL 16.1  09.6   Hematocrit 36.0 - 46.0 % 33.8  30.1   Platelets 150 - 400 K/uL 220  215       Latest Ref Rng & Units 11/25/2018    3:07 PM 08/01/2018   11:21 AM  CMP  Glucose 70 - 99 mg/dL 045  409   BUN 8 - 23 mg/dL 46  54   Creatinine 8.11 - 1.00 mg/dL 9.14  7.82   Sodium 956 - 145 mmol/L 134  142   Potassium 3.5 - 5.1 mmol/L 3.9  4.1   Chloride 98 - 111 mmol/L 99  106   CO2 22 - 32 mmol/L 24  29   Calcium 8.9 - 10.3 mg/dL 9.6  9.6   Total Protein 6.5 - 8.1 g/dL 7.5    Total Bilirubin 0.3 - 1.2 mg/dL 1.5    Alkaline Phos 38 - 126 U/L 61    AST 15 - 41 U/L 14    ALT 0 - 44 U/L 19       No results found for: "CEA1", "CEA" / No results found for: "CEA1", "CEA" No results found for: "PSA1" No results found for: "  ZOX096" No results found for: "CAN125"  No results found for: "TOTALPROTELP", "ALBUMINELP", "A1GS", "A2GS", "BETS", "BETA2SER", "GAMS", "MSPIKE", "SPEI" No results found for: "TIBC", "FERRITIN", "IRONPCTSAT" No results found for: "LDH"   STUDIES:   No results found.

## 2023-08-24 ENCOUNTER — Inpatient Hospital Stay: Payer: Medicare Other

## 2023-08-24 ENCOUNTER — Inpatient Hospital Stay: Payer: Medicare Other | Attending: Hematology

## 2023-08-24 ENCOUNTER — Inpatient Hospital Stay (HOSPITAL_BASED_OUTPATIENT_CLINIC_OR_DEPARTMENT_OTHER): Payer: Medicare Other | Admitting: Hematology

## 2023-08-24 VITALS — BP 140/57 | HR 64 | Temp 98.4°F | Resp 17 | Ht 63.0 in | Wt 147.2 lb

## 2023-08-24 DIAGNOSIS — D649 Anemia, unspecified: Secondary | ICD-10-CM

## 2023-08-24 DIAGNOSIS — I129 Hypertensive chronic kidney disease with stage 1 through stage 4 chronic kidney disease, or unspecified chronic kidney disease: Secondary | ICD-10-CM | POA: Insufficient documentation

## 2023-08-24 DIAGNOSIS — E1122 Type 2 diabetes mellitus with diabetic chronic kidney disease: Secondary | ICD-10-CM | POA: Insufficient documentation

## 2023-08-24 DIAGNOSIS — N189 Chronic kidney disease, unspecified: Secondary | ICD-10-CM | POA: Diagnosis present

## 2023-08-24 DIAGNOSIS — D631 Anemia in chronic kidney disease: Secondary | ICD-10-CM | POA: Diagnosis not present

## 2023-08-24 DIAGNOSIS — Z800 Family history of malignant neoplasm of digestive organs: Secondary | ICD-10-CM

## 2023-08-24 LAB — COMPREHENSIVE METABOLIC PANEL
ALT: 46 U/L — ABNORMAL HIGH (ref 0–44)
AST: 38 U/L (ref 15–41)
Albumin: 3.7 g/dL (ref 3.5–5.0)
Alkaline Phosphatase: 64 U/L (ref 38–126)
Anion gap: 8 (ref 5–15)
BUN: 70 mg/dL — ABNORMAL HIGH (ref 8–23)
CO2: 31 mmol/L (ref 22–32)
Calcium: 9.9 mg/dL (ref 8.9–10.3)
Chloride: 100 mmol/L (ref 98–111)
Creatinine, Ser: 3.24 mg/dL — ABNORMAL HIGH (ref 0.44–1.00)
GFR, Estimated: 14 mL/min — ABNORMAL LOW (ref >60)
Glucose, Bld: 150 mg/dL — ABNORMAL HIGH (ref 70–99)
Potassium: 3.9 mmol/L (ref 3.5–5.1)
Sodium: 139 mmol/L (ref 135–145)
Total Bilirubin: 0.9 mg/dL (ref 0.3–1.2)
Total Protein: 7.6 g/dL (ref 6.5–8.1)

## 2023-08-24 LAB — CBC WITH DIFFERENTIAL/PLATELET
Abs Immature Granulocytes: 0.03 10*3/uL (ref 0.00–0.07)
Basophils Absolute: 0 10*3/uL (ref 0.0–0.1)
Basophils Relative: 0 %
Eosinophils Absolute: 0.1 10*3/uL (ref 0.0–0.5)
Eosinophils Relative: 2 %
HCT: 33.1 % — ABNORMAL LOW (ref 36.0–46.0)
Hemoglobin: 10.4 g/dL — ABNORMAL LOW (ref 12.0–15.0)
Immature Granulocytes: 0 %
Lymphocytes Relative: 17 %
Lymphs Abs: 1.4 10*3/uL (ref 0.7–4.0)
MCH: 29 pg (ref 26.0–34.0)
MCHC: 31.4 g/dL (ref 30.0–36.0)
MCV: 92.2 fL (ref 80.0–100.0)
Monocytes Absolute: 0.6 10*3/uL (ref 0.1–1.0)
Monocytes Relative: 7 %
Neutro Abs: 6.1 10*3/uL (ref 1.7–7.7)
Neutrophils Relative %: 74 %
Platelets: 196 10*3/uL (ref 150–400)
RBC: 3.59 MIL/uL — ABNORMAL LOW (ref 3.87–5.11)
RDW: 12.4 % (ref 11.5–15.5)
WBC: 8.2 10*3/uL (ref 4.0–10.5)
nRBC: 0 % (ref 0.0–0.2)

## 2023-08-24 LAB — VITAMIN B12: Vitamin B-12: 1112 pg/mL — ABNORMAL HIGH (ref 180–914)

## 2023-08-24 LAB — IRON AND TIBC
Iron: 72 ug/dL (ref 28–170)
Saturation Ratios: 27 % (ref 10.4–31.8)
TIBC: 272 ug/dL (ref 250–450)
UIBC: 200 ug/dL

## 2023-08-24 LAB — FERRITIN: Ferritin: 438 ng/mL — ABNORMAL HIGH (ref 11–307)

## 2023-08-24 LAB — FOLATE: Folate: 8.9 ng/mL (ref >5.9)

## 2023-08-24 NOTE — Patient Instructions (Signed)
You were seen and examined today by Dr. Ellin Saba. Dr. Ellin Saba is a hematologist, meaning that he specializes in blood abnormalities. Dr. Ellin Saba discussed your past medical history, family history of cancers/blood conditions and the events that led to you being here today.  You were referred to Dr. Ellin Saba due to anemia related to your chronic kidney disease.  Dr. Ellin Saba has recommended additional labs today for further evaluation.  Follow-up as scheduled.

## 2023-08-28 LAB — METHYLMALONIC ACID, SERUM: Methylmalonic Acid, Quantitative: 544 nmol/L — ABNORMAL HIGH (ref 0–378)

## 2023-09-21 ENCOUNTER — Inpatient Hospital Stay: Payer: Medicare Other | Attending: Hematology

## 2023-09-21 ENCOUNTER — Inpatient Hospital Stay: Payer: Medicare Other

## 2023-09-21 VITALS — BP 133/61 | HR 62 | Temp 97.3°F | Resp 18

## 2023-09-21 DIAGNOSIS — I129 Hypertensive chronic kidney disease with stage 1 through stage 4 chronic kidney disease, or unspecified chronic kidney disease: Secondary | ICD-10-CM | POA: Insufficient documentation

## 2023-09-21 DIAGNOSIS — E1122 Type 2 diabetes mellitus with diabetic chronic kidney disease: Secondary | ICD-10-CM | POA: Diagnosis present

## 2023-09-21 DIAGNOSIS — D631 Anemia in chronic kidney disease: Secondary | ICD-10-CM | POA: Insufficient documentation

## 2023-09-21 DIAGNOSIS — N189 Chronic kidney disease, unspecified: Secondary | ICD-10-CM | POA: Insufficient documentation

## 2023-09-21 DIAGNOSIS — D649 Anemia, unspecified: Secondary | ICD-10-CM

## 2023-09-21 LAB — CBC
HCT: 30.3 % — ABNORMAL LOW (ref 36.0–46.0)
Hemoglobin: 9.3 g/dL — ABNORMAL LOW (ref 12.0–15.0)
MCH: 28.2 pg (ref 26.0–34.0)
MCHC: 30.7 g/dL (ref 30.0–36.0)
MCV: 91.8 fL (ref 80.0–100.0)
Platelets: 163 10*3/uL (ref 150–400)
RBC: 3.3 MIL/uL — ABNORMAL LOW (ref 3.87–5.11)
RDW: 12.4 % (ref 11.5–15.5)
WBC: 6.2 10*3/uL (ref 4.0–10.5)
nRBC: 0 % (ref 0.0–0.2)

## 2023-09-21 MED ORDER — EPOETIN ALFA-EPBX 10000 UNIT/ML IJ SOLN
10000.0000 [IU] | Freq: Once | INTRAMUSCULAR | Status: AC
  Administered 2023-09-21: 10000 [IU] via SUBCUTANEOUS
  Filled 2023-09-21: qty 1

## 2023-09-21 NOTE — Patient Instructions (Signed)
[facility]  Discharge Instructions: Thank you for choosing [facility] to provide your oncology and hematology care.  If you have a lab appointment with the Cancer Center - please note that after April 8th, 2024, all labs will be drawn in the cancer center.  You do not have to check in or register with the main entrance as you have in the past but will complete your check-in in the cancer center.  Wear comfortable clothing and clothing appropriate for easy access to any Portacath or PICC line.   We strive to give you quality time with your provider. You may need to reschedule your appointment if you arrive late (15 or more minutes).  Arriving late affects you and other patients whose appointments are after yours.  Also, if you miss three or more appointments without notifying the office, you may be dismissed from the clinic at the provider's discretion.      For prescription refill requests, have your pharmacy contact our office and allow 72 hours for refills to be completed.    Today you received the following chemotherapy and/or immunotherapy agents Retacrit      To help prevent nausea and vomiting after your treatment, we encourage you to take your nausea medication as directed.  BELOW ARE SYMPTOMS THAT SHOULD BE REPORTED IMMEDIATELY: *FEVER GREATER THAN 100.4 F (38 C) OR HIGHER *CHILLS OR SWEATING *NAUSEA AND VOMITING THAT IS NOT CONTROLLED WITH YOUR NAUSEA MEDICATION *UNUSUAL SHORTNESS OF BREATH *UNUSUAL BRUISING OR BLEEDING *URINARY PROBLEMS (pain or burning when urinating, or frequent urination) *BOWEL PROBLEMS (unusual diarrhea, constipation, pain near the anus) TENDERNESS IN MOUTH AND THROAT WITH OR WITHOUT PRESENCE OF ULCERS (sore throat, sores in mouth, or a toothache) UNUSUAL RASH, SWELLING OR PAIN  UNUSUAL VAGINAL DISCHARGE OR ITCHING   Items with * indicate a potential emergency and should be followed up as soon as possible or go to the  Emergency Department if any problems should occur.  Please show the CHEMOTHERAPY ALERT CARD or IMMUNOTHERAPY ALERT CARD at check-in to the Emergency Department and triage nurse.  Should you have questions after your visit or need to cancel or reschedule your appointment, please contact [facility] [phone]  and follow the prompts.  Office hours are 8:00 a.m. to 4:30 p.m. Monday - Friday. Please note that voicemails left after 4:00 p.m. may not be returned until the following business day.  We are closed weekends and major holidays. You have access to a nurse at all times for urgent questions. Please call the main number to the clinic [phone] and follow the prompts.  For any non-urgent questions, you may also contact your provider using MyChart. We now offer e-Visits for anyone 18 and older to request care online for non-urgent symptoms. For details visit [email or web address].   Also download the MyChart app! Go to the app store, search "MyChart", open the app, select , and log in with your MyChart username and password.   

## 2023-09-21 NOTE — Progress Notes (Signed)
Patient presents today for Retacrit injection per providers order.  Vital signs within parameters for injection Hgb noted to be 9.3.  Stable during administration without incident; injection site WNL; see MAR for injection details.  Patient tolerated procedure well and without incident.  No questions or complaints noted at this time.

## 2023-10-19 ENCOUNTER — Inpatient Hospital Stay: Payer: Medicare Other

## 2023-10-19 VITALS — BP 124/54 | HR 70 | Temp 97.9°F | Resp 19

## 2023-10-19 DIAGNOSIS — I129 Hypertensive chronic kidney disease with stage 1 through stage 4 chronic kidney disease, or unspecified chronic kidney disease: Secondary | ICD-10-CM | POA: Diagnosis not present

## 2023-10-19 DIAGNOSIS — D631 Anemia in chronic kidney disease: Secondary | ICD-10-CM

## 2023-10-19 DIAGNOSIS — D649 Anemia, unspecified: Secondary | ICD-10-CM

## 2023-10-19 LAB — CBC
HCT: 31.2 % — ABNORMAL LOW (ref 36.0–46.0)
Hemoglobin: 9.7 g/dL — ABNORMAL LOW (ref 12.0–15.0)
MCH: 29 pg (ref 26.0–34.0)
MCHC: 31.1 g/dL (ref 30.0–36.0)
MCV: 93.1 fL (ref 80.0–100.0)
Platelets: 159 10*3/uL (ref 150–400)
RBC: 3.35 MIL/uL — ABNORMAL LOW (ref 3.87–5.11)
RDW: 12.6 % (ref 11.5–15.5)
WBC: 7 10*3/uL (ref 4.0–10.5)
nRBC: 0 % (ref 0.0–0.2)

## 2023-10-19 MED ORDER — EPOETIN ALFA-EPBX 10000 UNIT/ML IJ SOLN
10000.0000 [IU] | Freq: Once | INTRAMUSCULAR | Status: AC
  Administered 2023-10-19: 10000 [IU] via SUBCUTANEOUS
  Filled 2023-10-19: qty 1

## 2023-10-19 NOTE — Progress Notes (Signed)
Patient presents today for Retacrit injection.  Patient is in satisfactory condition with no new complaints voiced.  Vital signs are stable.  Hemoglobin today is 9.7.  We will proceed with injection per MD orders.   Patient tolerated injection with no complaints voiced.  Site clean and dry with no bruising or swelling noted.  No complaints of pain.  Discharged with vital signs stable and no signs or symptoms of distress noted.

## 2023-10-19 NOTE — Patient Instructions (Signed)
[facility]  Discharge Instructions: Thank you for choosing [facility] to provide your oncology and hematology care.  If you have a lab appointment with the Cancer Center - please note that after April 8th, 2024, all labs will be drawn in the cancer center.  You do not have to check in or register with the main entrance as you have in the past but will complete your check-in in the cancer center.  Wear comfortable clothing and clothing appropriate for easy access to any Portacath or PICC line.   We strive to give you quality time with your provider. You may need to reschedule your appointment if you arrive late (15 or more minutes).  Arriving late affects you and other patients whose appointments are after yours.  Also, if you miss three or more appointments without notifying the office, you may be dismissed from the clinic at the provider's discretion.      For prescription refill requests, have your pharmacy contact our office and allow 72 hours for refills to be completed.    Today you received the following Retacrit.  Epoetin Alfa Injection What is this medication? EPOETIN ALFA (e POE e tin AL fa) treats low levels of red blood cells (anemia) caused by kidney disease, chemotherapy, or HIV medications. It can also be used in people who are at risk for blood loss during surgery. It works by Systems analyst make more red blood cells, which reduces the need for blood transfusions. This medicine may be used for other purposes; ask your health care provider or pharmacist if you have questions. COMMON BRAND NAME(S): Epogen, Procrit, Retacrit What should I tell my care team before I take this medication? They need to know if you have any of these conditions: Blood clots Cancer Heart disease High blood pressure On dialysis Seizures Stroke An unusual or allergic reaction to epoetin alfa, albumin, benzyl alcohol, other medications, foods, dyes, or  preservatives Pregnant or trying to get pregnant Breast-feeding How should I use this medication? This medication is injected into a vein or under the skin. It is usually given by your care team in a hospital or clinic setting. It may also be given at home. If you get this medication at home, you will be taught how to prepare and give it. Use exactly as directed. Take it as directed on the prescription label at the same time every day. Keep taking it unless your care team tells you to stop. It is important that you put your used needles and syringes in a special sharps container. Do not put them in a trash can. If you do not have a sharps container, call your pharmacist or care team to get one. A special MedGuide will be given to you by the pharmacist with each prescription and refill. Be sure to read this information carefully each time. Talk to your care team about the use of this medication in children. While this medication may be used in children as young as 1 month of age for selected conditions, precautions do apply. Overdosage: If you think you have taken too much of this medicine contact a poison control center or emergency room at once. NOTE: This medicine is only for you. Do not share this medicine with others. What if I miss a dose? If you miss a dose, take it as soon as you can. If it is almost time for your next dose, take only that dose. Do not take double or  extra doses. What may interact with this medication? Darbepoetin alfa Methoxy polyethylene glycol-epoetin beta This list may not describe all possible interactions. Give your health care provider a list of all the medicines, herbs, non-prescription drugs, or dietary supplements you use. Also tell them if you smoke, drink alcohol, or use illegal drugs. Some items may interact with your medicine. What should I watch for while using this medication? Visit your care team for regular checks on your progress. Check your blood pressure  as directed. Know what your blood pressure should be and when to contact your care team. Your condition will be monitored carefully while you are receiving this medication. You may need blood work while taking this medication. What side effects may I notice from receiving this medication? Side effects that you should report to your care team as soon as possible: Allergic reactions--skin rash, itching, hives, swelling of the face, lips, tongue, or throat Blood clot--pain, swelling, or warmth in the leg, shortness of breath, chest pain Heart attack--pain or tightness in the chest, shoulders, arms, or jaw, nausea, shortness of breath, cold or clammy skin, feeling faint or lightheaded Increase in blood pressure Rash, fever, and swollen lymph nodes Redness, blistering, peeling, or loosening of the skin, including inside the mouth Seizures Stroke--sudden numbness or weakness of the face, arm, or leg, trouble speaking, confusion, trouble walking, loss of balance or coordination, dizziness, severe headache, change in vision Side effects that usually do not require medical attention (report to your care team if they continue or are bothersome): Bone, joint, or muscle pain Cough Headache Nausea Pain, redness, or irritation at injection site This list may not describe all possible side effects. Call your doctor for medical advice about side effects. You may report side effects to FDA at 1-800-FDA-1088. Where should I keep my medication? Keep out of the reach of children and pets. Store in a refrigerator. Do not freeze. Do not shake. Protect from light. Keep this medication in the original container until you are ready to take it. See product for storage information. Get rid of any unused medication after the expiration date. To get rid of medications that are no longer needed or have expired: Take the medication to a medication take-back program. Check with your pharmacy or law enforcement to find a  location. If you cannot return the medication, ask your pharmacist or care team how to get rid of the medication safely. NOTE: This sheet is a summary. It may not cover all possible information. If you have questions about this medicine, talk to your doctor, pharmacist, or health care provider.  2024 Elsevier/Gold Standard (2022-04-08 00:00:00)       To help prevent nausea and vomiting after your treatment, we encourage you to take your nausea medication as directed.  BELOW ARE SYMPTOMS THAT SHOULD BE REPORTED IMMEDIATELY: *FEVER GREATER THAN 100.4 F (38 C) OR HIGHER *CHILLS OR SWEATING *NAUSEA AND VOMITING THAT IS NOT CONTROLLED WITH YOUR NAUSEA MEDICATION *UNUSUAL SHORTNESS OF BREATH *UNUSUAL BRUISING OR BLEEDING *URINARY PROBLEMS (pain or burning when urinating, or frequent urination) *BOWEL PROBLEMS (unusual diarrhea, constipation, pain near the anus) TENDERNESS IN MOUTH AND THROAT WITH OR WITHOUT PRESENCE OF ULCERS (sore throat, sores in mouth, or a toothache) UNUSUAL RASH, SWELLING OR PAIN  UNUSUAL VAGINAL DISCHARGE OR ITCHING   Items with * indicate a potential emergency and should be followed up as soon as possible or go to the Emergency Department if any problems should occur.  Please show the CHEMOTHERAPY ALERT CARD  or IMMUNOTHERAPY ALERT CARD at check-in to the Emergency Department and triage nurse.  Should you have questions after your visit or need to cancel or reschedule your appointment, please contact [facility]ENTER AT [facility] [phone]  and follow the prompts.  Office hours are 8:00 a.m. to 4:30 p.m. Monday - Friday. Please note that voicemails left after 4:00 p.m. may not be returned until the following business day.  We are closed weekends and major holidays. You have access to a nurse at all times for urgent questions. Please call the main number to the clinic [phone] and follow the prompts.  For any non-urgent questions, you may also contact your  provider using MyChart. We now offer e-Visits for anyone 25 and older to request care online for non-urgent symptoms. For details visit [email or web address].   Also download the MyChart app! Go to the app store, search "MyChart", open the app, select [place], and log in with your MyChart username and password.

## 2023-11-13 ENCOUNTER — Inpatient Hospital Stay: Payer: Medicare Other

## 2023-11-14 ENCOUNTER — Inpatient Hospital Stay: Payer: Medicare Other

## 2023-11-15 ENCOUNTER — Inpatient Hospital Stay: Payer: Medicare Other | Attending: Hematology

## 2023-11-20 ENCOUNTER — Inpatient Hospital Stay: Payer: Medicare Other

## 2023-11-23 ENCOUNTER — Inpatient Hospital Stay: Payer: Medicare Other

## 2023-11-23 ENCOUNTER — Inpatient Hospital Stay: Payer: Medicare Other | Admitting: Oncology

## 2023-12-04 ENCOUNTER — Inpatient Hospital Stay: Payer: Medicare Other | Attending: Hematology

## 2023-12-04 DIAGNOSIS — E1122 Type 2 diabetes mellitus with diabetic chronic kidney disease: Secondary | ICD-10-CM | POA: Insufficient documentation

## 2023-12-04 DIAGNOSIS — Z79899 Other long term (current) drug therapy: Secondary | ICD-10-CM | POA: Diagnosis not present

## 2023-12-04 DIAGNOSIS — D631 Anemia in chronic kidney disease: Secondary | ICD-10-CM | POA: Diagnosis not present

## 2023-12-04 DIAGNOSIS — D649 Anemia, unspecified: Secondary | ICD-10-CM

## 2023-12-04 DIAGNOSIS — N184 Chronic kidney disease, stage 4 (severe): Secondary | ICD-10-CM | POA: Insufficient documentation

## 2023-12-04 DIAGNOSIS — I129 Hypertensive chronic kidney disease with stage 1 through stage 4 chronic kidney disease, or unspecified chronic kidney disease: Secondary | ICD-10-CM | POA: Diagnosis present

## 2023-12-04 LAB — CBC
HCT: 31.4 % — ABNORMAL LOW (ref 36.0–46.0)
Hemoglobin: 9.6 g/dL — ABNORMAL LOW (ref 12.0–15.0)
MCH: 28.1 pg (ref 26.0–34.0)
MCHC: 30.6 g/dL (ref 30.0–36.0)
MCV: 91.8 fL (ref 80.0–100.0)
Platelets: 202 10*3/uL (ref 150–400)
RBC: 3.42 MIL/uL — ABNORMAL LOW (ref 3.87–5.11)
RDW: 12.3 % (ref 11.5–15.5)
WBC: 5.2 10*3/uL (ref 4.0–10.5)
nRBC: 0 % (ref 0.0–0.2)

## 2023-12-14 ENCOUNTER — Inpatient Hospital Stay: Payer: Medicare Other | Admitting: Oncology

## 2023-12-14 ENCOUNTER — Inpatient Hospital Stay: Payer: Medicare Other

## 2023-12-14 VITALS — BP 128/82 | HR 72 | Temp 98.9°F | Resp 16 | Wt 149.0 lb

## 2023-12-14 DIAGNOSIS — D631 Anemia in chronic kidney disease: Secondary | ICD-10-CM | POA: Diagnosis not present

## 2023-12-14 DIAGNOSIS — N184 Chronic kidney disease, stage 4 (severe): Secondary | ICD-10-CM | POA: Diagnosis not present

## 2023-12-14 DIAGNOSIS — I129 Hypertensive chronic kidney disease with stage 1 through stage 4 chronic kidney disease, or unspecified chronic kidney disease: Secondary | ICD-10-CM | POA: Diagnosis not present

## 2023-12-14 MED ORDER — EPOETIN ALFA-EPBX 10000 UNIT/ML IJ SOLN
10000.0000 [IU] | Freq: Once | INTRAMUSCULAR | Status: AC
Start: 2023-12-14 — End: 2023-12-14
  Administered 2023-12-14: 10000 [IU] via SUBCUTANEOUS
  Filled 2023-12-14: qty 1

## 2023-12-14 NOTE — Progress Notes (Signed)
Tara Holmes presents today for injection per the provider's orders.  Retacrit 10,000U administration without incident; injection site WNL; see MAR for injection details.  Patient tolerated procedure well and without incident.  No questions or complaints noted at this time. Patient's hemoglobin noted to be 9.6 today.  Discharged from clinic ambulatory in stable condition. Alert and oriented x 3. F/U with [facility] as scheduled.

## 2023-12-14 NOTE — Patient Instructions (Signed)
 [facility] [place] [facility]  Discharge Instructions: Thank you for choosing [facility] to provide your oncology and hematology care.  If you have a lab appointment with the Cancer Center - please note that after April 8th, 2024, all labs will be drawn in the cancer center.  You do not have to check in or register with the main entrance as you have in the past but will complete your check-in in the cancer center.  Wear comfortable clothing and clothing appropriate for easy access to any Portacath or PICC line.   We strive to give you quality time with your provider. You may need to reschedule your appointment if you arrive late (15 or more minutes).  Arriving late affects you and other patients whose appointments are after yours.  Also, if you miss three or more appointments without notifying the office, you may be dismissed from the clinic at the provider's discretion.      For prescription refill requests, have your pharmacy contact our office and allow 72 hours for refills to be completed.    Today you received Retacrit 10,000U      BELOW ARE SYMPTOMS THAT SHOULD BE REPORTED IMMEDIATELY: *FEVER GREATER THAN 100.4 F (38 C) OR HIGHER *CHILLS OR SWEATING *NAUSEA AND VOMITING THAT IS NOT CONTROLLED WITH YOUR NAUSEA MEDICATION *UNUSUAL SHORTNESS OF BREATH *UNUSUAL BRUISING OR BLEEDING *URINARY PROBLEMS (pain or burning when urinating, or frequent urination) *BOWEL PROBLEMS (unusual diarrhea, constipation, pain near the anus) TENDERNESS IN MOUTH AND THROAT WITH OR WITHOUT PRESENCE OF ULCERS (sore throat, sores in mouth, or a toothache) UNUSUAL RASH, SWELLING OR PAIN  UNUSUAL VAGINAL DISCHARGE OR ITCHING   Items with * indicate a potential emergency and should be followed up as soon as possible or go to the Emergency Department if any problems should occur.  Please show the CHEMOTHERAPY ALERT CARD or IMMUNOTHERAPY ALERT CARD at check-in  to the Emergency Department and triage nurse.  Should you have questions after your visit or need to cancel or reschedule your appointment, please contact [facility] [place] - A DEPT OF Eligha Bridegroom [facility] [phone]  and follow the prompts.  Office hours are 8:00 a.m. to 4:30 p.m. Monday - Friday. Please note that voicemails left after 4:00 p.m. may not be returned until the following business day.  We are closed weekends and major holidays. You have access to a nurse at all times for urgent questions. Please call the main number to the clinic [phone] and follow the prompts.  For any non-urgent questions, you may also contact your provider using MyChart. We now offer e-Visits for anyone 2 and older to request care online for non-urgent symptoms. For details visit [email or web address].   Also download the MyChart app! Go to the app store, search "MyChart", open the app, select , and log in with your MyChart username and password.

## 2023-12-14 NOTE — Progress Notes (Signed)
[facility] [address] [place] [address]   Clinic Day:  12/14/23   Referring physician: Kirstie Peri, MD  Patient Care Team: Kirstie Peri, MD as PCP - General (Internal Medicine) Lennette Bihari, MD as PCP - Cardiology (Cardiology)   ASSESSMENT & PLAN:   Assessment:  1.  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 Wolfgang Phoenix. - Start her on Retacrit 8000 units monthly, 6 months ago at [facility] - Takes iron tablet 325 mg daily for the last 1 year. - Colonoscopy (2023) by Dr. Lynden Ang in [place].  Denies BRBPR/melena.  2. Social/Family History:  -Lives at home with her husband.  She independent of ADLS and iADLS. Retired Engineer, site. No chemical exposure. No tobacco use. No ETOH. -Mother had breast cancer. Father had colon cancer. No family history of anemia.   Plan:  1.  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  Hemoglobin is 9.6.  - Proceed with Retacrit today.  - Nutritional workup from 08/24/2023 was essentially unremarkable.  - Will continue to monitor CBC monthly and give Retacrit 10,000 units as needed if hemoglobin less than 10. - RTC 3 months with repeat CBC, ferritin, iron panel.  Will also check SPEP and free light chains at that time.   No orders of the defined types were placed in this encounter.    Mauro Kaufmann, NP   12/26/20249:15 AM  CHIEF COMPLAINT/PURPOSE OF CONSULT:   Diagnosis: Anemia from CKD  Current Therapy: Retacrit as needed  HISTORY OF PRESENT ILLNESS:   Tara Holmes is a 77 y.o. female presenting to clinic today for follow-up for anemia.  She was last seen by Dr. Ellin Saba for evaluation on 08/24/2023.  She has been receiving 10,000 units Retacrit for hemoglobin less than 10.  Last dose was given on 10/19/2023.  In the interim, she denies any hospitalizations, surgeries or changes to her baseline health.  Her appetite is 75% energy levels are 100%.  She denies any pain.  Reports she  stays cold at can ever get warm.  Reports she had a left AV fistula placed about 3 years ago in preparation for dialysis but has not had to start.  Reports she saw Dr. Wolfgang Phoenix about a month ago and he mentioned she may need dialysis in the next couple of months.  She does not know of any appointments that have been made though.    PAST MEDICAL HISTORY:   Past Medical History: Past Medical History:  Diagnosis Date   Anemia    Aneurysm (HCC) 1978   Arthritis    Chronic kidney disease    ESRD   Diabetes (HCC)    Encephalomalacia    GERD (gastroesophageal reflux disease)    Gout    Head injury 10/20/2020   d/t fall   Heart disease    Hypertension    Mild memory disturbance    Seizures (HCC)    Sleep apnea     Surgical History: Past Surgical History:  Procedure Laterality Date   AV FISTULA INSERTION W/ RF MAGNETIC GUIDANCE Left 08/08/2018   Procedure: AV FISTULA INSERTION W/RF MAGNETIC GUIDANCE;  Surgeon: Renford Dills, MD;  Location: [facility] INVASIVE CV LAB;  Service: Cardiovascular;  Laterality: Left;   BREAST SURGERY Left    fibroid   TONSILLECTOMY      Social History: Social History   Socioeconomic History   Marital status: Married    Spouse name: Ramon Dredge   Number  of children: 2   Years of education: MA   Highest education level: Not on file  Occupational History   Occupation: Retired  Tobacco Use   Smoking status: Never   Smokeless tobacco: Never  Vaping Use   Vaping status: Never Used  Substance and Sexual Activity   Alcohol use: No   Drug use: Never   Sexual activity: Not on file  Other Topics Concern   Not on file  Social History Narrative   Patient lives at home with spouse.   Caffeine Use: none   Social Drivers of Corporate investment banker Strain: Not on file  Food Insecurity: Not on file  Transportation Needs: Not on file  Physical Activity: Not on file  Stress: Not on file  Social Connections: Not on file  Intimate Partner Violence:  Not on file    Family History: Family History  Problem Relation Age of Onset   Heart attack Mother    Diabetes Mother    Colon cancer Father     Current Medications:  Current Outpatient Medications:    ACCU-CHEK COMPACT PLUS test strip, CHECK BLOOD SUGAR ONCE A DAY, Disp: , Rfl: 3   amLODipine (NORVASC) 2.5 MG tablet, Take 2.5 mg by mouth daily., Disp: , Rfl:    carvedilol (COREG) 25 MG tablet, Take 25 mg by mouth 2 (two) times daily. , Disp: , Rfl:    cholecalciferol (VITAMIN D3) 25 MCG (1000 UNIT) tablet, Take 1,000 Units by mouth daily., Disp: , Rfl:    cinacalcet (SENSIPAR) 30 MG tablet, Take 30 mg by mouth 3 (three) times a week. M W F, Disp: , Rfl:    ezetimibe (ZETIA) 10 MG tablet, Take 10 mg by mouth daily., Disp: , Rfl:    ferrous sulfate 325 (65 FE) MG EC tablet, Take 325 mg by mouth every evening., Disp: , Rfl: 0   furosemide (LASIX) 20 MG tablet, Take 20 mg by mouth 2 (two) times daily., Disp: , Rfl:    losartan (COZAAR) 100 MG tablet, Take 100 mg by mouth daily., Disp: , Rfl:    oxybutynin (DITROPAN) 5 MG tablet, Take 5 mg by mouth 2 (two) times daily. , Disp: , Rfl:    rosuvastatin (CRESTOR) 10 MG tablet, Take 10 mg by mouth daily., Disp: , Rfl:    Allergies: Allergies  Allergen Reactions   Amoxicillin Other (See Comments)    Pt states allergic reaction in vaginal region. Has patient had a PCN reaction causing immediate rash, facial/tongue/throat swelling, SOB or lightheadedness with hypotension: No Has patient had a PCN reaction causing severe rash involving mucus membranes or skin necrosis: No Has patient had a PCN reaction that required hospitalization: No Has patient had a PCN reaction occurring within the last 10 years: No Yeast infection reaction ONLY If all of the above answers are "NO", then may proceed with Ce   Atorvastatin Other (See Comments)    MUSCLE ACHES    Prednisone Other (See Comments)    ELEVATED BLOOD SUGAR   Iodinated Contrast Media Other  (See Comments)    PATIENT IS UNAWARE OF THIS ALLERGY OR ANY REACTION TO THIS MEDICATION. Other reaction(s): Other (See Comments) PATIENT IS UNAWARE OF THIS ALLERGY OR ANY REACTION TO THIS MEDICATION.    REVIEW OF SYSTEMS:   Review of Systems  Constitutional:  Negative for fatigue.  Respiratory:  Negative for cough and shortness of breath.   Gastrointestinal:  Negative for abdominal pain, constipation, diarrhea, nausea and vomiting.  Musculoskeletal:  Negative for arthralgias and myalgias.  Neurological:  Negative for dizziness and headaches.     VITALS:   Blood pressure 128/82, pulse 72, temperature 98.9 F (37.2 C), temperature source Oral, resp. rate 16, weight 149 lb (67.6 kg), SpO2 100%.  Wt Readings from Last 3 Encounters:  12/14/23 149 lb (67.6 kg)  08/24/23 147 lb 3.2 oz (66.8 kg)  07/03/23 144 lb 12.8 oz (65.7 kg)    Body mass index is 26.39 kg/m.   PHYSICAL EXAM:   Physical Exam Constitutional:      Appearance: Normal appearance.  Cardiovascular:     Rate and Rhythm: Normal rate and regular rhythm.  Pulmonary:     Effort: Pulmonary effort is normal.     Breath sounds: Normal breath sounds.  Abdominal:     General: Bowel sounds are normal.     Palpations: Abdomen is soft.  Musculoskeletal:        General: No swelling. Normal range of motion.  Neurological:     Mental Status: She is alert and oriented to person, place, and time. Mental status is at baseline.     LABS:      Latest Ref Rng & Units 12/04/2023   10:55 AM 10/19/2023    8:59 AM 09/21/2023    2:08 PM  CBC  WBC 4.0 - 10.5 K/uL 5.2  7.0  6.2   Hemoglobin 12.0 - 15.0 g/dL 9.6  9.7  9.3   Hematocrit 36.0 - 46.0 % 31.4  31.2  30.3   Platelets 150 - 400 K/uL 202  159  163       Latest Ref Rng & Units 08/24/2023    9:48 AM 11/25/2018    3:07 PM 08/01/2018   11:21 AM  CMP  Glucose 70 - 99 mg/dL 865  784  696   BUN 8 - 23 mg/dL 70  46  54   Creatinine 0.44 - 1.00 mg/dL 2.95  2.84  1.32    Sodium 135 - 145 mmol/L 139  134  142   Potassium 3.5 - 5.1 mmol/L 3.9  3.9  4.1   Chloride 98 - 111 mmol/L 100  99  106   CO2 22 - 32 mmol/L 31  24  29    Calcium 8.9 - 10.3 mg/dL 9.9  9.6  9.6   Total Protein 6.5 - 8.1 g/dL 7.6  7.5    Total Bilirubin 0.3 - 1.2 mg/dL 0.9  1.5    Alkaline Phos 38 - 126 U/L 64  61    AST 15 - 41 U/L 38  14    ALT 0 - 44 U/L 46  19       No results found for: "CEA1", "CEA" / No results found for: "CEA1", "CEA" No results found for: "PSA1" No results found for: "GMW102" No results found for: "CAN125"  No results found for: "TOTALPROTELP", "ALBUMINELP", "A1GS", "A2GS", "BETS", "BETA2SER", "GAMS", "MSPIKE", "SPEI" Lab Results  Component Value Date   TIBC 272 08/24/2023   FERRITIN 438 (H) 08/24/2023   IRONPCTSAT 27 08/24/2023   No results found for: "LDH"   STUDIES:   No results found.

## 2024-01-11 ENCOUNTER — Inpatient Hospital Stay: Payer: Medicare Other | Attending: Hematology

## 2024-01-11 ENCOUNTER — Inpatient Hospital Stay: Payer: Medicare Other

## 2024-01-11 VITALS — BP 140/46 | HR 71 | Temp 98.0°F | Resp 16

## 2024-01-11 DIAGNOSIS — E1122 Type 2 diabetes mellitus with diabetic chronic kidney disease: Secondary | ICD-10-CM | POA: Insufficient documentation

## 2024-01-11 DIAGNOSIS — D649 Anemia, unspecified: Secondary | ICD-10-CM

## 2024-01-11 DIAGNOSIS — N185 Chronic kidney disease, stage 5: Secondary | ICD-10-CM | POA: Diagnosis present

## 2024-01-11 DIAGNOSIS — D631 Anemia in chronic kidney disease: Secondary | ICD-10-CM | POA: Insufficient documentation

## 2024-01-11 DIAGNOSIS — I120 Hypertensive chronic kidney disease with stage 5 chronic kidney disease or end stage renal disease: Secondary | ICD-10-CM | POA: Diagnosis present

## 2024-01-11 LAB — CBC
HCT: 31.4 % — ABNORMAL LOW (ref 36.0–46.0)
Hemoglobin: 9.5 g/dL — ABNORMAL LOW (ref 12.0–15.0)
MCH: 27.9 pg (ref 26.0–34.0)
MCHC: 30.3 g/dL (ref 30.0–36.0)
MCV: 92.1 fL (ref 80.0–100.0)
Platelets: 164 10*3/uL (ref 150–400)
RBC: 3.41 MIL/uL — ABNORMAL LOW (ref 3.87–5.11)
RDW: 12.5 % (ref 11.5–15.5)
WBC: 5.8 10*3/uL (ref 4.0–10.5)
nRBC: 0 % (ref 0.0–0.2)

## 2024-01-11 MED ORDER — EPOETIN ALFA-EPBX 10000 UNIT/ML IJ SOLN
10000.0000 [IU] | Freq: Once | INTRAMUSCULAR | Status: AC
Start: 2024-01-11 — End: 2024-01-11
  Administered 2024-01-11: 10000 [IU] via SUBCUTANEOUS
  Filled 2024-01-11: qty 1

## 2024-01-11 NOTE — Progress Notes (Signed)
Patient presents today for Retacrit injection. Hemoglobin reviewed prior to administration. VSS tolerated without incident or complaint. See MAR for details. Patient stable during and after injection. Patient discharged in satisfactory condition with no s/s of distress noted.  

## 2024-01-11 NOTE — Patient Instructions (Signed)

## 2024-02-08 ENCOUNTER — Inpatient Hospital Stay: Payer: Medicare Other

## 2024-02-13 ENCOUNTER — Inpatient Hospital Stay: Payer: Medicare Other | Attending: Hematology

## 2024-02-13 ENCOUNTER — Inpatient Hospital Stay: Payer: Medicare Other

## 2024-02-13 VITALS — BP 129/65 | HR 66 | Temp 97.5°F | Resp 18

## 2024-02-13 DIAGNOSIS — E1122 Type 2 diabetes mellitus with diabetic chronic kidney disease: Secondary | ICD-10-CM | POA: Diagnosis present

## 2024-02-13 DIAGNOSIS — I120 Hypertensive chronic kidney disease with stage 5 chronic kidney disease or end stage renal disease: Secondary | ICD-10-CM | POA: Diagnosis present

## 2024-02-13 DIAGNOSIS — D631 Anemia in chronic kidney disease: Secondary | ICD-10-CM

## 2024-02-13 DIAGNOSIS — N185 Chronic kidney disease, stage 5: Secondary | ICD-10-CM | POA: Diagnosis present

## 2024-02-13 DIAGNOSIS — D649 Anemia, unspecified: Secondary | ICD-10-CM

## 2024-02-13 LAB — CBC
HCT: 31 % — ABNORMAL LOW (ref 36.0–46.0)
Hemoglobin: 9.3 g/dL — ABNORMAL LOW (ref 12.0–15.0)
MCH: 27.3 pg (ref 26.0–34.0)
MCHC: 30 g/dL (ref 30.0–36.0)
MCV: 90.9 fL (ref 80.0–100.0)
Platelets: 160 10*3/uL (ref 150–400)
RBC: 3.41 MIL/uL — ABNORMAL LOW (ref 3.87–5.11)
RDW: 13.3 % (ref 11.5–15.5)
WBC: 5.9 10*3/uL (ref 4.0–10.5)
nRBC: 0 % (ref 0.0–0.2)

## 2024-02-13 MED ORDER — EPOETIN ALFA-EPBX 10000 UNIT/ML IJ SOLN
10000.0000 [IU] | Freq: Once | INTRAMUSCULAR | Status: AC
  Administered 2024-02-13: 10000 [IU] via SUBCUTANEOUS
  Filled 2024-02-13: qty 1

## 2024-02-13 NOTE — Patient Instructions (Signed)
 [facility] [place] - A DEPT OF [facility]  Discharge Instructions: Thank you for choosing [facility] to provide your oncology and hematology care.  If you have a lab appointment with the Cancer Center - please note that after April 8th, 2024, all labs will be drawn in the cancer center.  You do not have to check in or register with the main entrance as you have in the past but will complete your check-in in the cancer center.  Wear comfortable clothing and clothing appropriate for easy access to any Portacath or PICC line.   We strive to give you quality time with your provider. You may need to reschedule your appointment if you arrive late (15 or more minutes).  Arriving late affects you and other patients whose appointments are after yours.  Also, if you miss three or more appointments without notifying the office, you may be dismissed from the clinic at the provider's discretion.      For prescription refill requests, have your pharmacy contact our office and allow 72 hours for refills to be completed.    Today you received Retacrit 10,000U injection.     BELOW ARE SYMPTOMS THAT SHOULD BE REPORTED IMMEDIATELY: *FEVER GREATER THAN 100.4 F (38 C) OR HIGHER *CHILLS OR SWEATING *NAUSEA AND VOMITING THAT IS NOT CONTROLLED WITH YOUR NAUSEA MEDICATION *UNUSUAL SHORTNESS OF BREATH *UNUSUAL BRUISING OR BLEEDING *URINARY PROBLEMS (pain or burning when urinating, or frequent urination) *BOWEL PROBLEMS (unusual diarrhea, constipation, pain near the anus) TENDERNESS IN MOUTH AND THROAT WITH OR WITHOUT PRESENCE OF ULCERS (sore throat, sores in mouth, or a toothache) UNUSUAL RASH, SWELLING OR PAIN  UNUSUAL VAGINAL DISCHARGE OR ITCHING   Items with * indicate a potential emergency and should be followed up as soon as possible or go to the Emergency Department if any problems should occur.  Please show the CHEMOTHERAPY ALERT CARD or IMMUNOTHERAPY ALERT CARD at  check-in to the Emergency Department and triage nurse.  Should you have questions after your visit or need to cancel or reschedule your appointment, please contact [facility] [place] - A DEPT OF Eligha Bridegroom [facility] [phone]  and follow the prompts.  Office hours are 8:00 a.m. to 4:30 p.m. Monday - Friday. Please note that voicemails left after 4:00 p.m. may not be returned until the following business day.  We are closed weekends and major holidays. You have access to a nurse at all times for urgent questions. Please call the main number to the clinic [phone] and follow the prompts.  For any non-urgent questions, you may also contact your provider using MyChart. We now offer e-Visits for anyone 75 and older to request care online for non-urgent symptoms. For details visit [email or web address].   Also download the MyChart app! Go to the app store, search "MyChart", open the app, select [place], and log in with your MyChart username and password.

## 2024-02-13 NOTE — Progress Notes (Signed)
 Tara Holmes presents today for injection per the provider's orders. Retacrit 10,000U administration without incident; injection site WNL; see MAR for injection details.  Patient tolerated procedure well and without incident.  No questions or complaints noted at this time. Patient's hemoglobin noted to be 9.3 today.  Discharged from clinic ambulatory in stable condition. Alert and oriented x 3. F/U with [facility] as scheduled.

## 2024-02-19 ENCOUNTER — Other Ambulatory Visit: Payer: Self-pay | Admitting: *Deleted

## 2024-02-19 DIAGNOSIS — D631 Anemia in chronic kidney disease: Secondary | ICD-10-CM

## 2024-02-26 ENCOUNTER — Telehealth: Payer: Self-pay | Admitting: Cardiovascular Disease

## 2024-02-26 NOTE — Telephone Encounter (Signed)
 Patient would like to know if Dr. Tresa Endo would like her to provide information from her doctors in [place] at her scheduled appt on March 14, 2024. Please advise.

## 2024-02-26 NOTE — Telephone Encounter (Signed)
 Spoke with pt husband, aware insurance cards are scanned in her chart. He reports the information for the reason for the card is wrong. Nothing else needed at this time.

## 2024-03-07 ENCOUNTER — Inpatient Hospital Stay: Payer: Medicare Other

## 2024-03-07 ENCOUNTER — Inpatient Hospital Stay: Payer: Medicare Other | Admitting: Oncology

## 2024-03-12 ENCOUNTER — Inpatient Hospital Stay: Payer: Medicare Other

## 2024-03-12 ENCOUNTER — Inpatient Hospital Stay: Payer: Medicare Other | Attending: Hematology

## 2024-03-12 VITALS — BP 143/85 | HR 67 | Temp 97.5°F | Resp 18

## 2024-03-12 DIAGNOSIS — D631 Anemia in chronic kidney disease: Secondary | ICD-10-CM

## 2024-03-12 DIAGNOSIS — I120 Hypertensive chronic kidney disease with stage 5 chronic kidney disease or end stage renal disease: Secondary | ICD-10-CM | POA: Insufficient documentation

## 2024-03-12 DIAGNOSIS — E1122 Type 2 diabetes mellitus with diabetic chronic kidney disease: Secondary | ICD-10-CM | POA: Insufficient documentation

## 2024-03-12 DIAGNOSIS — D649 Anemia, unspecified: Secondary | ICD-10-CM

## 2024-03-12 DIAGNOSIS — N185 Chronic kidney disease, stage 5: Secondary | ICD-10-CM | POA: Diagnosis present

## 2024-03-12 LAB — COMPREHENSIVE METABOLIC PANEL
ALT: 17 U/L (ref 0–44)
AST: 15 U/L (ref 15–41)
Albumin: 3.3 g/dL — ABNORMAL LOW (ref 3.5–5.0)
Alkaline Phosphatase: 74 U/L (ref 38–126)
Anion gap: 9 (ref 5–15)
BUN: 52 mg/dL — ABNORMAL HIGH (ref 8–23)
CO2: 31 mmol/L (ref 22–32)
Calcium: 9.7 mg/dL (ref 8.9–10.3)
Chloride: 100 mmol/L (ref 98–111)
Creatinine, Ser: 2.96 mg/dL — ABNORMAL HIGH (ref 0.44–1.00)
GFR, Estimated: 16 mL/min — ABNORMAL LOW (ref >60)
Glucose, Bld: 168 mg/dL — ABNORMAL HIGH (ref 70–99)
Potassium: 3.7 mmol/L (ref 3.5–5.1)
Sodium: 140 mmol/L (ref 135–145)
Total Bilirubin: 0.8 mg/dL (ref 0.0–1.2)
Total Protein: 7.1 g/dL (ref 6.5–8.1)

## 2024-03-12 LAB — CBC WITH DIFFERENTIAL/PLATELET
Abs Immature Granulocytes: 0.02 10*3/uL (ref 0.00–0.07)
Basophils Absolute: 0 10*3/uL (ref 0.0–0.1)
Basophils Relative: 0 %
Eosinophils Absolute: 0.1 10*3/uL (ref 0.0–0.5)
Eosinophils Relative: 2 %
HCT: 29.1 % — ABNORMAL LOW (ref 36.0–46.0)
Hemoglobin: 8.8 g/dL — ABNORMAL LOW (ref 12.0–15.0)
Immature Granulocytes: 0 %
Lymphocytes Relative: 13 %
Lymphs Abs: 1 10*3/uL (ref 0.7–4.0)
MCH: 27.7 pg (ref 26.0–34.0)
MCHC: 30.2 g/dL (ref 30.0–36.0)
MCV: 91.5 fL (ref 80.0–100.0)
Monocytes Absolute: 0.7 10*3/uL (ref 0.1–1.0)
Monocytes Relative: 9 %
Neutro Abs: 6 10*3/uL (ref 1.7–7.7)
Neutrophils Relative %: 76 %
Platelets: 164 10*3/uL (ref 150–400)
RBC: 3.18 MIL/uL — ABNORMAL LOW (ref 3.87–5.11)
RDW: 13.4 % (ref 11.5–15.5)
WBC: 7.8 10*3/uL (ref 4.0–10.5)
nRBC: 0 % (ref 0.0–0.2)

## 2024-03-12 LAB — PROTEIN / CREATININE RATIO, URINE
Creatinine, Urine: 69 mg/dL
Protein Creatinine Ratio: 1 mg/mg{creat} — ABNORMAL HIGH (ref 0.00–0.15)
Total Protein, Urine: 69 mg/dL

## 2024-03-12 LAB — FERRITIN: Ferritin: 421 ng/mL — ABNORMAL HIGH (ref 11–307)

## 2024-03-12 LAB — IRON AND TIBC
Iron: 30 ug/dL (ref 28–170)
Saturation Ratios: 13 % (ref 10.4–31.8)
TIBC: 231 ug/dL — ABNORMAL LOW (ref 250–450)
UIBC: 201 ug/dL

## 2024-03-12 LAB — RENAL FUNCTION PANEL
Albumin: 3.3 g/dL — ABNORMAL LOW (ref 3.5–5.0)
Anion gap: 10 (ref 5–15)
BUN: 53 mg/dL — ABNORMAL HIGH (ref 8–23)
CO2: 29 mmol/L (ref 22–32)
Calcium: 9.7 mg/dL (ref 8.9–10.3)
Chloride: 100 mmol/L (ref 98–111)
Creatinine, Ser: 3.03 mg/dL — ABNORMAL HIGH (ref 0.44–1.00)
GFR, Estimated: 16 mL/min — ABNORMAL LOW (ref >60)
Glucose, Bld: 170 mg/dL — ABNORMAL HIGH (ref 70–99)
Phosphorus: 3.5 mg/dL (ref 2.5–4.6)
Potassium: 3.7 mmol/L (ref 3.5–5.1)
Sodium: 139 mmol/L (ref 135–145)

## 2024-03-12 LAB — VITAMIN D 25 HYDROXY (VIT D DEFICIENCY, FRACTURES): Vit D, 25-Hydroxy: 49.86 ng/mL (ref 30–100)

## 2024-03-12 MED ORDER — EPOETIN ALFA-EPBX 10000 UNIT/ML IJ SOLN
10000.0000 [IU] | Freq: Once | INTRAMUSCULAR | Status: AC
  Administered 2024-03-12: 10000 [IU] via SUBCUTANEOUS
  Filled 2024-03-12: qty 1

## 2024-03-12 NOTE — Patient Instructions (Signed)

## 2024-03-12 NOTE — Progress Notes (Signed)
 Patient tolerated injection with no complaints voiced.  Site clean and dry with no bruising or swelling noted at site.  See MAR for details.  Band aid applied.  Patient stable during and after injection.  Vss with discharge and left in satisfactory condition with no s/s of distress noted.

## 2024-03-13 LAB — KAPPA/LAMBDA LIGHT CHAINS
Kappa free light chain: 116 mg/L — ABNORMAL HIGH (ref 3.3–19.4)
Kappa, lambda light chain ratio: 1.8 — ABNORMAL HIGH (ref 0.26–1.65)
Lambda free light chains: 64.5 mg/L — ABNORMAL HIGH (ref 5.7–26.3)

## 2024-03-13 LAB — PTH, INTACT AND CALCIUM
Calcium, Total (PTH): 9.5 mg/dL (ref 8.7–10.3)
PTH: 142 pg/mL — ABNORMAL HIGH (ref 15–65)

## 2024-03-14 ENCOUNTER — Inpatient Hospital Stay: Payer: Medicare Other | Admitting: Oncology

## 2024-03-14 ENCOUNTER — Ambulatory Visit: Admitting: Cardiovascular Disease

## 2024-03-14 ENCOUNTER — Inpatient Hospital Stay: Payer: Medicare Other

## 2024-03-14 LAB — PROTEIN ELECTROPHORESIS, SERUM
A/G Ratio: 0.9 (ref 0.7–1.7)
Albumin ELP: 3.3 g/dL (ref 2.9–4.4)
Alpha-1-Globulin: 0.3 g/dL (ref 0.0–0.4)
Alpha-2-Globulin: 0.7 g/dL (ref 0.4–1.0)
Beta Globulin: 1 g/dL (ref 0.7–1.3)
Gamma Globulin: 1.5 g/dL (ref 0.4–1.8)
Globulin, Total: 3.5 g/dL (ref 2.2–3.9)
Total Protein ELP: 6.8 g/dL (ref 6.0–8.5)

## 2024-03-18 NOTE — Progress Notes (Unsigned)
 [address]ara Holmes 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 female returns for routine follow-up of anemia of CKD.  She was last seen by NP Durenda Hurt on 12/14/2023.  At today's visit, she reports feeling fairly well, apart from some recent episodes of gout.  No recent hospitalizations, surgeries, or changes in baseline health status.  She is tolerating Retacrit well without any major side effects.  Blood pressure has been at goal.  No symptoms concerning for DVT or PE.   She  denies any bleeding episodes.  No fatigue, pica, headaches, lightheadedness, syncope, or dyspnea on exertion.   She has had some short-lived right sided chest pain beneath her right breast that resolved with rest and lasted < 10 minutes (PCP is aware, follows with cardiology Dr. Nicki Guadalajara).  She continues to take iron tablet daily.    She has 75% energy and 80% appetite. She endorses that she is maintaining a stable weight.  ASSESSMENT & PLAN:  1.  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 Wolfgang Phoenix. - Hematology workup showed elevated B12 and elevated MMA (likely artifact in the setting of CKD stage IV/V).  Normal folate. - SPEP negative for M spike.  Minimally elevated FLC ratio 1.80 (kappa 116.0, lambda 64.5), likely artifact in the setting of CKD stage IV/V. - ESA therapy with Retacrit initiated at [facility] around March 2024 - Colonoscopy (2023) by Dr. Lynden Ang in [place]. - Takes ferrous sulfate 325 mg daily.   - Current dose is Retacrit to 10,000 units every 4 weeks for Hgb <10.0  - Denies BRBPR/melena. - Most recent labs (03/12/2024): Hgb 8.8/MCV 91.5 Creatinine 2.96/GFR 16 Ferritin  421 Iron saturation 13% - PLAN: Hemoglobin not at goal.   Increase Retacrit frequency to 10,000 units every 2 weeks for Hgb <11.0. - RTC 3 months - Continue oral iron supplement.  If she continues to have functional iron deficiency (iron saturation <20%), we will consider IV iron repletion. - Check serum immunofixation to rule out plasma cell disorder in the setting of elevated FLC ratio.  2.  Social/Family History:  - Lives at home with her husband.  She independent of ADLS and iADLS. Retired Engineer, site. No chemical exposure. No tobacco use. No ETOH. - Mother had breast cancer. Father had colon cancer. No family history of anemia.   PLAN SUMMARY: >> CBC + Retacrit 10,000 units every 2 weeks (will be due NEXT WEEK) >> Labs in 3 months = CBC/D, ferritin, iron/TIBC, CMP, serum immunofixation >> OFFICE visit in 3 months (2 weeks after full lab panel) with same-day CBC/Retacrit injection     REVIEW OF SYSTEMS:   Review of Systems  Constitutional:  Negative for appetite change, chills, diaphoresis, fatigue, fever and unexpected weight change.  HENT:   Negative for lump/mass and nosebleeds.   Eyes:  Negative for eye problems.  Respiratory:  Negative for cough, hemoptysis and shortness of breath.   Cardiovascular:  Positive for chest pain (at times). Negative for leg swelling and palpitations.  Gastrointestinal:  Negative for abdominal pain, blood in stool, constipation, diarrhea, nausea and vomiting.  Genitourinary:  Negative for hematuria.   Skin: Negative.  Neurological:  Negative for dizziness, headaches and light-headedness.  Hematological:  Does not bruise/bleed easily.     PHYSICAL EXAM:  ECOG PERFORMANCE STATUS: 0 - Asymptomatic  Vitals:   03/19/24 0914  BP: 135/62  Pulse: (!) 52  Resp: 17  Temp: 98.1 F (36.7 C)  SpO2: 100%   Filed Weights   03/19/24 0914  Weight: 148 lb 3.2 oz (67.2 kg)   Physical Exam Constitutional:      Appearance: Normal appearance. She is  normal weight.  Cardiovascular:     Rate and Rhythm: Bradycardia present.     Heart sounds: Normal heart sounds.  Pulmonary:     Breath sounds: Normal breath sounds.  Neurological:     General: No focal deficit present.     Mental Status: Mental status is at baseline.  Psychiatric:        Behavior: Behavior normal. Behavior is cooperative.    PAST MEDICAL/SURGICAL HISTORY:  Past Medical History:  Diagnosis Date   Anemia    Aneurysm (HCC) 1978   Arthritis    Chronic kidney disease    ESRD   Diabetes (HCC)    Encephalomalacia    GERD (gastroesophageal reflux disease)    Gout    Head injury 10/20/2020   d/t fall   Heart disease    Hypertension    Mild memory disturbance    Seizures (HCC)    Sleep apnea    Past Surgical History:  Procedure Laterality Date   AV FISTULA INSERTION W/ RF MAGNETIC GUIDANCE Left 08/08/2018   Procedure: AV FISTULA INSERTION W/RF MAGNETIC GUIDANCE;  Surgeon: Renford Dills, MD;  Location: [facility] INVASIVE CV LAB;  Service: Cardiovascular;  Laterality: Left;   BREAST SURGERY Left    fibroid   TONSILLECTOMY      SOCIAL HISTORY:  Social History   Socioeconomic History   Marital status: Married    Spouse name: Ramon Dredge   Number of children: 2   Years of education: MA   Highest education level: Not on file  Occupational History   Occupation: Retired  Tobacco Use   Smoking status: Never   Smokeless tobacco: Never  Vaping Use   Vaping status: Never Used  Substance and Sexual Activity   Alcohol use: No   Drug use: Never   Sexual activity: Not on file  Other Topics Concern   Not on file  Social History Narrative   Patient lives at home with spouse.   Caffeine Use: none   Social Drivers of Corporate investment banker Strain: Not on file  Food Insecurity: Not on file  Transportation Needs: Not on file  Physical Activity: Not on file  Stress: Not on file  Social Connections: Not on file  Intimate Partner Violence: Not on file     FAMILY HISTORY:  Family History  Problem Relation Age of Onset   Heart attack Mother    Diabetes Mother    Colon cancer Father     CURRENT MEDICATIONS:  Outpatient Encounter Medications as of 03/19/2024  Medication Sig Note   ACCU-CHEK COMPACT PLUS test strip CHECK BLOOD SUGAR ONCE A DAY    amLODipine (NORVASC) 2.5 MG tablet Take 2.5 mg by mouth daily.    carvedilol (COREG) 25 MG tablet Take 25 mg by mouth 2 (two) times daily.     cholecalciferol (VITAMIN D3) 25 MCG (1000 UNIT) tablet Take 1,000 Units by mouth daily.    cinacalcet (SENSIPAR) 30 MG tablet Take 30 mg by mouth 3 (three)  times a week. M W F    ezetimibe (ZETIA) 10 MG tablet Take 10 mg by mouth daily.    ferrous sulfate 325 (65 FE) MG EC tablet Take 325 mg by mouth every evening. 06/27/2019: Taking every evening   furosemide (LASIX) 40 MG tablet Take 40 mg by mouth daily.    losartan (COZAAR) 100 MG tablet Take 100 mg by mouth daily.    oxybutynin (DITROPAN) 5 MG tablet Take 5 mg by mouth 2 (two) times daily.     rosuvastatin (CRESTOR) 10 MG tablet Take 10 mg by mouth daily.    [DISCONTINUED] furosemide (LASIX) 20 MG tablet Take 20 mg by mouth 2 (two) times daily. 11/24/2020: 11/24/20 taking daily   No facility-administered encounter medications on file as of 03/19/2024.    ALLERGIES:  Allergies  Allergen Reactions   Amoxicillin Other (See Comments)    Pt states allergic reaction in vaginal region. Has patient had a PCN reaction causing immediate rash, facial/tongue/throat swelling, SOB or lightheadedness with hypotension: No Has patient had a PCN reaction causing severe rash involving mucus membranes or skin necrosis: No Has patient had a PCN reaction that required hospitalization: No Has patient had a PCN reaction occurring within the last 10 years: No Yeast infection reaction ONLY If all of the above answers are "NO", then may proceed with Ce   Atorvastatin Other (See Comments)    MUSCLE ACHES    Prednisone Other  (See Comments)    ELEVATED BLOOD SUGAR   Iodinated Contrast Media Other (See Comments)    PATIENT IS UNAWARE OF THIS ALLERGY OR ANY REACTION TO THIS MEDICATION. Other reaction(s): Other (See Comments) PATIENT IS UNAWARE OF THIS ALLERGY OR ANY REACTION TO THIS MEDICATION.    LABORATORY DATA:  I have reviewed the labs as listed.  CBC    Component Value Date/Time   WBC 7.8 03/12/2024 0903   RBC 3.18 (L) 03/12/2024 0903   HGB 8.8 (L) 03/12/2024 0903   HCT 29.1 (L) 03/12/2024 0903   PLT 164 03/12/2024 0903   MCV 91.5 03/12/2024 0903   MCH 27.7 03/12/2024 0903   MCHC 30.2 03/12/2024 0903   RDW 13.4 03/12/2024 0903   LYMPHSABS 1.0 03/12/2024 0903   MONOABS 0.7 03/12/2024 0903   EOSABS 0.1 03/12/2024 0903   BASOSABS 0.0 03/12/2024 0903      Latest Ref Rng & Units 03/12/2024    9:03 AM 08/24/2023    9:48 AM 11/25/2018    3:07 PM  CMP  Glucose 70 - 99 mg/dL 70 - 99 mg/dL 621    308  657  846   BUN 8 - 23 mg/dL 8 - 23 mg/dL 53    52  70  46   Creatinine 0.44 - 1.00 mg/dL 9.62 - 9.52 mg/dL 8.41    3.24  4.01  0.27   Sodium 135 - 145 mmol/L 135 - 145 mmol/L 139    140  139  134   Potassium 3.5 - 5.1 mmol/L 3.5 - 5.1 mmol/L 3.7    3.7  3.9  3.9   Chloride 98 - 111 mmol/L 98 - 111 mmol/L 100    100  100  99   CO2 22 - 32 mmol/L 22 - 32 mmol/L 29    31  31  24    Calcium 8.7 - 10.3 mg/dL 8.9 - 25.3 mg/dL 8.9 - 66.4 mg/dL 9.5    9.7    9.7  9.9  9.6   Total Protein  6.5 - 8.1 g/dL 7.1  7.6  7.5   Total Bilirubin 0.0 - 1.2 mg/dL 0.8  0.9  1.5   Alkaline Phos 38 - 126 U/L 74  64  61   AST 15 - 41 U/L 15  38  14   ALT 0 - 44 U/L 17  46  19     DIAGNOSTIC IMAGING:  I have independently reviewed the relevant imaging and discussed with the patient.   WRAP UP:  All questions were answered. The patient knows to call the clinic with any problems, questions or concerns.  Medical decision making: Moderate  Time spent on visit: I spent 20 minutes counseling the patient face to  face. The total time spent in the appointment was 30 minutes and more than 50% was on counseling.  Carnella Guadalajara, PA-C  03/19/24 10:11 AM

## 2024-03-19 ENCOUNTER — Inpatient Hospital Stay: Payer: Medicare Other | Attending: Hematology | Admitting: Physician Assistant

## 2024-03-19 VITALS — BP 135/62 | HR 52 | Temp 98.1°F | Resp 17 | Ht 63.0 in | Wt 148.2 lb

## 2024-03-19 DIAGNOSIS — D631 Anemia in chronic kidney disease: Secondary | ICD-10-CM | POA: Insufficient documentation

## 2024-03-19 DIAGNOSIS — D649 Anemia, unspecified: Secondary | ICD-10-CM | POA: Diagnosis not present

## 2024-03-19 DIAGNOSIS — Z803 Family history of malignant neoplasm of breast: Secondary | ICD-10-CM | POA: Diagnosis not present

## 2024-03-19 DIAGNOSIS — E1122 Type 2 diabetes mellitus with diabetic chronic kidney disease: Secondary | ICD-10-CM | POA: Insufficient documentation

## 2024-03-19 DIAGNOSIS — N184 Chronic kidney disease, stage 4 (severe): Secondary | ICD-10-CM

## 2024-03-19 DIAGNOSIS — N185 Chronic kidney disease, stage 5: Secondary | ICD-10-CM | POA: Diagnosis present

## 2024-03-19 DIAGNOSIS — Z800 Family history of malignant neoplasm of digestive organs: Secondary | ICD-10-CM | POA: Insufficient documentation

## 2024-03-19 DIAGNOSIS — I120 Hypertensive chronic kidney disease with stage 5 chronic kidney disease or end stage renal disease: Secondary | ICD-10-CM | POA: Insufficient documentation

## 2024-03-19 NOTE — Patient Instructions (Signed)
 [facility] at [facility] **VISIT SUMMARY & IMPORTANT INSTRUCTIONS **   You were seen today by Rojelio Brenner PA-C for your follow-up visit.    ANEMIA FROM CHRONIC KIDNEY DISEASE Chronic kidney disease makes it difficult for your body to make new blood cells. Your blood levels are lower than they need to be. We will INCREASE your Retacrit injections so that you receive it every 2 weeks (instead of monthly). Continue taking iron tablet (ferrous sulfate 325 mg) daily.  FOLLOW-UP APPOINTMENT: 3 months  ** Thank you for trusting me with your healthcare!  I strive to provide all of my patients with quality care at each visit.  If you receive a survey for this visit, I would be so grateful to you for taking the time to provide feedback.  Thank you in advance!  ~ Letticia Bhattacharyya                   Dr. Doreatha Massed   &   Rojelio Brenner, PA-C   - - - - - - - - - - - - - - - - - -    Thank you for choosing [facility] at [facility] to provide your oncology and hematology care.  To afford each patient quality time with our provider, please arrive at least 15 minutes before your scheduled appointment time.   If you have a lab appointment with the Cancer Center please come in thru the Main Entrance and check in at the main information desk.  You need to re-schedule your appointment should you arrive 10 or more minutes late.  We strive to give you quality time with our providers, and arriving late affects you and other patients whose appointments are after yours.  Also, if you no show three or more times for appointments you may be dismissed from the clinic at the providers discretion.     Again, thank you for choosing [facility].  Our hope is that these requests will decrease the amount of time that you wait before being seen by our physicians.       _____________________________________________________________  Should you have  questions after your visit to [facility], please contact our office at [phone] and follow the prompts.  Our office hours are 8:00 a.m. and 4:30 p.m. Monday - Friday.  Please note that voicemails left after 4:00 p.m. may not be returned until the following business day.  We are closed weekends and major holidays.  You do have access to a nurse 24-7, just call the main number to the clinic [phone] and do not press any options, hold on the line and a nurse will answer the phone.    For prescription refill requests, have your pharmacy contact our office and allow 72 hours.

## 2024-03-26 ENCOUNTER — Inpatient Hospital Stay

## 2024-03-26 VITALS — BP 130/58 | HR 67 | Temp 97.2°F | Resp 18

## 2024-03-26 DIAGNOSIS — I120 Hypertensive chronic kidney disease with stage 5 chronic kidney disease or end stage renal disease: Secondary | ICD-10-CM | POA: Diagnosis not present

## 2024-03-26 DIAGNOSIS — D649 Anemia, unspecified: Secondary | ICD-10-CM

## 2024-03-26 DIAGNOSIS — D631 Anemia in chronic kidney disease: Secondary | ICD-10-CM

## 2024-03-26 LAB — CBC
HCT: 29.5 % — ABNORMAL LOW (ref 36.0–46.0)
Hemoglobin: 8.8 g/dL — ABNORMAL LOW (ref 12.0–15.0)
MCH: 27.5 pg (ref 26.0–34.0)
MCHC: 29.8 g/dL — ABNORMAL LOW (ref 30.0–36.0)
MCV: 92.2 fL (ref 80.0–100.0)
Platelets: 194 10*3/uL (ref 150–400)
RBC: 3.2 MIL/uL — ABNORMAL LOW (ref 3.87–5.11)
RDW: 14.4 % (ref 11.5–15.5)
WBC: 6.5 10*3/uL (ref 4.0–10.5)
nRBC: 0 % (ref 0.0–0.2)

## 2024-03-26 MED ORDER — EPOETIN ALFA-EPBX 10000 UNIT/ML IJ SOLN
10000.0000 [IU] | Freq: Once | INTRAMUSCULAR | Status: AC
  Administered 2024-03-26: 10000 [IU] via SUBCUTANEOUS
  Filled 2024-03-26: qty 1

## 2024-03-26 NOTE — Progress Notes (Signed)
 Patient's Hgb 8.8 and blood pressure stable. Patient tolerated Retacrit  injection with no complaints voiced.  Site clean and dry with no bruising or swelling noted at site.  See MAR for details.  Band aid applied.  Patient stable during and after injection.  Vss with discharge and left in satisfactory condition with no s/s of distress noted. All follow ups as scheduled.  Tara Holmes [employer]

## 2024-04-09 ENCOUNTER — Inpatient Hospital Stay: Payer: Medicare Other

## 2024-04-09 VITALS — BP 125/70 | HR 70 | Temp 97.1°F | Resp 18

## 2024-04-09 DIAGNOSIS — D649 Anemia, unspecified: Secondary | ICD-10-CM

## 2024-04-09 DIAGNOSIS — I120 Hypertensive chronic kidney disease with stage 5 chronic kidney disease or end stage renal disease: Secondary | ICD-10-CM | POA: Diagnosis not present

## 2024-04-09 DIAGNOSIS — D631 Anemia in chronic kidney disease: Secondary | ICD-10-CM

## 2024-04-09 LAB — CBC
HCT: 30.1 % — ABNORMAL LOW (ref 36.0–46.0)
Hemoglobin: 9 g/dL — ABNORMAL LOW (ref 12.0–15.0)
MCH: 27.7 pg (ref 26.0–34.0)
MCHC: 29.9 g/dL — ABNORMAL LOW (ref 30.0–36.0)
MCV: 92.6 fL (ref 80.0–100.0)
Platelets: 147 10*3/uL — ABNORMAL LOW (ref 150–400)
RBC: 3.25 MIL/uL — ABNORMAL LOW (ref 3.87–5.11)
RDW: 14.6 % (ref 11.5–15.5)
WBC: 4.9 10*3/uL (ref 4.0–10.5)
nRBC: 0 % (ref 0.0–0.2)

## 2024-04-09 MED ORDER — EPOETIN ALFA-EPBX 10000 UNIT/ML IJ SOLN
10000.0000 [IU] | Freq: Once | INTRAMUSCULAR | Status: AC
  Administered 2024-04-09: 10000 [IU] via SUBCUTANEOUS
  Filled 2024-04-09: qty 1

## 2024-04-09 NOTE — Progress Notes (Signed)
 Patient tolerated injection with no complaints voiced.  Site clean and dry with no bruising or swelling noted at site.  See MAR for details.  Band aid applied.  Patient stable during and after injection.  Vss with discharge and left in satisfactory condition with no s/s of distress noted.

## 2024-04-23 ENCOUNTER — Inpatient Hospital Stay

## 2024-04-23 ENCOUNTER — Inpatient Hospital Stay: Attending: Hematology

## 2024-04-23 VITALS — BP 125/49 | HR 67 | Temp 97.6°F | Resp 16

## 2024-04-23 DIAGNOSIS — N185 Chronic kidney disease, stage 5: Secondary | ICD-10-CM | POA: Insufficient documentation

## 2024-04-23 DIAGNOSIS — D631 Anemia in chronic kidney disease: Secondary | ICD-10-CM | POA: Insufficient documentation

## 2024-04-23 DIAGNOSIS — I120 Hypertensive chronic kidney disease with stage 5 chronic kidney disease or end stage renal disease: Secondary | ICD-10-CM | POA: Insufficient documentation

## 2024-04-23 DIAGNOSIS — E1122 Type 2 diabetes mellitus with diabetic chronic kidney disease: Secondary | ICD-10-CM | POA: Insufficient documentation

## 2024-04-23 DIAGNOSIS — D649 Anemia, unspecified: Secondary | ICD-10-CM

## 2024-04-23 LAB — CBC
HCT: 31.5 % — ABNORMAL LOW (ref 36.0–46.0)
Hemoglobin: 9.3 g/dL — ABNORMAL LOW (ref 12.0–15.0)
MCH: 27.1 pg (ref 26.0–34.0)
MCHC: 29.5 g/dL — ABNORMAL LOW (ref 30.0–36.0)
MCV: 91.8 fL (ref 80.0–100.0)
Platelets: 180 10*3/uL (ref 150–400)
RBC: 3.43 MIL/uL — ABNORMAL LOW (ref 3.87–5.11)
RDW: 14.6 % (ref 11.5–15.5)
WBC: 7.3 10*3/uL (ref 4.0–10.5)
nRBC: 0 % (ref 0.0–0.2)

## 2024-04-23 MED ORDER — EPOETIN ALFA-EPBX 10000 UNIT/ML IJ SOLN
10000.0000 [IU] | Freq: Once | INTRAMUSCULAR | Status: AC
  Administered 2024-04-23: 10000 [IU] via SUBCUTANEOUS
  Filled 2024-04-23: qty 1

## 2024-04-23 NOTE — Progress Notes (Signed)
 Patient's hgb 9.3 and blood pressure stable. Patient  tolerated Retacrit  injection with no complaints voiced.  Site clean and dry with no bruising or swelling noted at site.  See MAR for details.  Band aid applied.  Patient stable during and after injection.  Vss with discharge and left in satisfactory condition with no s/s of distress noted. All follow ups as scheduled.   Kennisha Qin [employer]

## 2024-05-07 ENCOUNTER — Inpatient Hospital Stay

## 2024-05-07 ENCOUNTER — Encounter (INDEPENDENT_AMBULATORY_CARE_PROVIDER_SITE_OTHER): Payer: Self-pay

## 2024-05-07 VITALS — BP 144/70 | HR 71 | Resp 18

## 2024-05-07 DIAGNOSIS — D631 Anemia in chronic kidney disease: Secondary | ICD-10-CM

## 2024-05-07 DIAGNOSIS — D649 Anemia, unspecified: Secondary | ICD-10-CM

## 2024-05-07 DIAGNOSIS — I120 Hypertensive chronic kidney disease with stage 5 chronic kidney disease or end stage renal disease: Secondary | ICD-10-CM | POA: Diagnosis not present

## 2024-05-07 LAB — CBC
HCT: 31.9 % — ABNORMAL LOW (ref 36.0–46.0)
Hemoglobin: 9.8 g/dL — ABNORMAL LOW (ref 12.0–15.0)
MCH: 28.1 pg (ref 26.0–34.0)
MCHC: 30.7 g/dL (ref 30.0–36.0)
MCV: 91.4 fL (ref 80.0–100.0)
Platelets: 178 10*3/uL (ref 150–400)
RBC: 3.49 MIL/uL — ABNORMAL LOW (ref 3.87–5.11)
RDW: 14.7 % (ref 11.5–15.5)
WBC: 5.6 10*3/uL (ref 4.0–10.5)
nRBC: 0 % (ref 0.0–0.2)

## 2024-05-07 MED ORDER — EPOETIN ALFA-EPBX 10000 UNIT/ML IJ SOLN
10000.0000 [IU] | Freq: Once | INTRAMUSCULAR | Status: AC
  Administered 2024-05-07: 10000 [IU] via SUBCUTANEOUS
  Filled 2024-05-07: qty 1

## 2024-05-07 NOTE — Patient Instructions (Signed)

## 2024-05-07 NOTE — Progress Notes (Signed)
Patient presents today for Retacrit injection. Hemoglobin reviewed prior to administration. VSS tolerated without incident or complaint. See MAR for details. Patient stable during and after injection. Patient discharged in satisfactory condition with no s/s of distress noted.  

## 2024-05-09 ENCOUNTER — Encounter: Payer: Self-pay | Admitting: Hematology

## 2024-05-09 ENCOUNTER — Other Ambulatory Visit (HOSPITAL_COMMUNITY)
Admission: RE | Admit: 2024-05-09 | Discharge: 2024-05-09 | Disposition: A | Discharge status: Home / Self Care | Source: Ambulatory Visit | Attending: Nephrology | Admitting: Nephrology

## 2024-05-09 DIAGNOSIS — D631 Anemia in chronic kidney disease: Secondary | ICD-10-CM | POA: Diagnosis not present

## 2024-05-09 DIAGNOSIS — I129 Hypertensive chronic kidney disease with stage 1 through stage 4 chronic kidney disease, or unspecified chronic kidney disease: Secondary | ICD-10-CM | POA: Diagnosis present

## 2024-05-09 DIAGNOSIS — N185 Chronic kidney disease, stage 5: Secondary | ICD-10-CM | POA: Diagnosis present

## 2024-05-09 DIAGNOSIS — R809 Proteinuria, unspecified: Secondary | ICD-10-CM | POA: Insufficient documentation

## 2024-05-09 DIAGNOSIS — E211 Secondary hyperparathyroidism, not elsewhere classified: Secondary | ICD-10-CM | POA: Diagnosis not present

## 2024-05-09 DIAGNOSIS — E1122 Type 2 diabetes mellitus with diabetic chronic kidney disease: Secondary | ICD-10-CM | POA: Diagnosis present

## 2024-05-09 LAB — RENAL FUNCTION PANEL
Albumin: 3.6 g/dL (ref 3.5–5.0)
Anion gap: 8 (ref 5–15)
BUN: 65 mg/dL — ABNORMAL HIGH (ref 8–23)
CO2: 30 mmol/L (ref 22–32)
Calcium: 9.5 mg/dL (ref 8.9–10.3)
Chloride: 100 mmol/L (ref 98–111)
Creatinine, Ser: 2.81 mg/dL — ABNORMAL HIGH (ref 0.44–1.00)
GFR, Estimated: 17 mL/min — ABNORMAL LOW (ref >60)
Glucose, Bld: 131 mg/dL — ABNORMAL HIGH (ref 70–99)
Phosphorus: 4 mg/dL (ref 2.5–4.6)
Potassium: 3.8 mmol/L (ref 3.5–5.1)
Sodium: 138 mmol/L (ref 135–145)

## 2024-05-09 LAB — CBC
HCT: 34.3 % — ABNORMAL LOW (ref 36.0–46.0)
Hemoglobin: 10.4 g/dL — ABNORMAL LOW (ref 12.0–15.0)
MCH: 28 pg (ref 26.0–34.0)
MCHC: 30.3 g/dL (ref 30.0–36.0)
MCV: 92.5 fL (ref 80.0–100.0)
Platelets: 179 10*3/uL (ref 150–400)
RBC: 3.71 MIL/uL — ABNORMAL LOW (ref 3.87–5.11)
RDW: 14.7 % (ref 11.5–15.5)
WBC: 6 10*3/uL (ref 4.0–10.5)
nRBC: 0 % (ref 0.0–0.2)

## 2024-05-10 LAB — MICROALBUMIN / CREATININE URINE RATIO
Creatinine, Urine: 47.3 mg/dL
Microalb Creat Ratio: 549 mg/g{creat} — ABNORMAL HIGH (ref 0–29)
Microalb, Ur: 259.9 ug/mL — ABNORMAL HIGH

## 2024-05-21 ENCOUNTER — Inpatient Hospital Stay

## 2024-05-21 ENCOUNTER — Inpatient Hospital Stay: Attending: Hematology

## 2024-05-21 ENCOUNTER — Other Ambulatory Visit: Payer: Self-pay | Admitting: Physician Assistant

## 2024-05-21 ENCOUNTER — Ambulatory Visit: Admitting: Cardiovascular Disease

## 2024-05-21 VITALS — BP 137/64 | HR 56 | Temp 96.7°F | Resp 18

## 2024-05-21 DIAGNOSIS — I120 Hypertensive chronic kidney disease with stage 5 chronic kidney disease or end stage renal disease: Secondary | ICD-10-CM | POA: Insufficient documentation

## 2024-05-21 DIAGNOSIS — E1122 Type 2 diabetes mellitus with diabetic chronic kidney disease: Secondary | ICD-10-CM | POA: Diagnosis present

## 2024-05-21 DIAGNOSIS — N185 Chronic kidney disease, stage 5: Secondary | ICD-10-CM | POA: Insufficient documentation

## 2024-05-21 DIAGNOSIS — D631 Anemia in chronic kidney disease: Secondary | ICD-10-CM

## 2024-05-21 DIAGNOSIS — D649 Anemia, unspecified: Secondary | ICD-10-CM

## 2024-05-21 LAB — CBC
HCT: 32.9 % — ABNORMAL LOW (ref 36.0–46.0)
Hemoglobin: 10.1 g/dL — ABNORMAL LOW (ref 12.0–15.0)
MCH: 28.2 pg (ref 26.0–34.0)
MCHC: 30.7 g/dL (ref 30.0–36.0)
MCV: 91.9 fL (ref 80.0–100.0)
Platelets: 177 10*3/uL (ref 150–400)
RBC: 3.58 MIL/uL — ABNORMAL LOW (ref 3.87–5.11)
RDW: 14.6 % (ref 11.5–15.5)
WBC: 5.3 10*3/uL (ref 4.0–10.5)
nRBC: 0 % (ref 0.0–0.2)

## 2024-05-21 MED ORDER — EPOETIN ALFA-EPBX 10000 UNIT/ML IJ SOLN
10000.0000 [IU] | Freq: Once | INTRAMUSCULAR | Status: AC
  Administered 2024-05-21: 10000 [IU] via SUBCUTANEOUS
  Filled 2024-05-21: qty 1

## 2024-05-21 NOTE — Patient Instructions (Signed)
 [facility] - A DEPT OF [facility]  Discharge Instructions: Thank you for choosing  Cancer Center to provide your oncology and hematology care.  If you have a lab appointment with the Cancer Center - please note that after April 8th, 2024, all labs will be drawn in the cancer center.  You do not have to check in or register with the main entrance as you have in the past but will complete your check-in in the cancer center.  Wear comfortable clothing and clothing appropriate for easy access to any Portacath or PICC line.   We strive to give you quality time with your provider. You may need to reschedule your appointment if you arrive late (15 or more minutes).  Arriving late affects you and other patients whose appointments are after yours.  Also, if you miss three or more appointments without notifying the office, you may be dismissed from the clinic at the provider's discretion.      For prescription refill requests, have your pharmacy contact our office and allow 72 hours for refills to be completed.    Today you received the following chemotherapy and/or immunotherapy agents Retacrit. Epoetin Alfa Injection What is this medication? EPOETIN ALFA (e POE e tin AL fa) treats low levels of red blood cells (anemia) caused by kidney disease, chemotherapy, or HIV medications. It can also be used in people who are at risk for blood loss during surgery. It works by Systems analyst make more red blood cells, which reduces the need for blood transfusions. This medicine may be used for other purposes; ask your health care provider or pharmacist if you have questions. COMMON BRAND NAME(S): Epogen, Procrit, Retacrit What should I tell my care team before I take this medication? They need to know if you have any of these conditions: Blood clots Cancer Heart disease High blood pressure On dialysis Seizures Stroke An unusual or allergic reaction to epoetin  alfa, albumin, benzyl alcohol, other medications, foods, dyes, or preservatives Pregnant or trying to get pregnant Breast-feeding How should I use this medication? This medication is injected into a vein or under the skin. It is usually given by your care team in a hospital or clinic setting. It may also be given at home. If you get this medication at home, you will be taught how to prepare and give it. Use exactly as directed. Take it as directed on the prescription label at the same time every day. Keep taking it unless your care team tells you to stop. It is important that you put your used needles and syringes in a special sharps container. Do not put them in a trash can. If you do not have a sharps container, call your pharmacist or care team to get one. A special MedGuide will be given to you by the pharmacist with each prescription and refill. Be sure to read this information carefully each time. Talk to your care team about the use of this medication in children. While this medication may be used in children as young as 1 month of age for selected conditions, precautions do apply. Overdosage: If you think you have taken too much of this medicine contact a poison control center or emergency room at once. NOTE: This medicine is only for you. Do not share this medicine with others. What if I miss a dose? If you miss a dose, take it as soon as you can. If it is almost time for  your next dose, take only that dose. Do not take double or extra doses. What may interact with this medication? Darbepoetin alfa Methoxy polyethylene glycol-epoetin beta This list may not describe all possible interactions. Give your health care provider a list of all the medicines, herbs, non-prescription drugs, or dietary supplements you use. Also tell them if you smoke, drink alcohol, or use illegal drugs. Some items may interact with your medicine. What should I watch for while using this medication? Visit your care  team for regular checks on your progress. Check your blood pressure as directed. Know what your blood pressure should be and when to contact your care team. Your condition will be monitored carefully while you are receiving this medication. You may need blood work while taking this medication. What side effects may I notice from receiving this medication? Side effects that you should report to your care team as soon as possible: Allergic reactions--skin rash, itching, hives, swelling of the face, lips, tongue, or throat Blood clot--pain, swelling, or warmth in the leg, shortness of breath, chest pain Heart attack--pain or tightness in the chest, shoulders, arms, or jaw, nausea, shortness of breath, cold or clammy skin, feeling faint or lightheaded Increase in blood pressure Rash, fever, and swollen lymph nodes Redness, blistering, peeling, or loosening of the skin, including inside the mouth Seizures Stroke--sudden numbness or weakness of the face, arm, or leg, trouble speaking, confusion, trouble walking, loss of balance or coordination, dizziness, severe headache, change in vision Side effects that usually do not require medical attention (report to your care team if they continue or are bothersome): Bone, joint, or muscle pain Cough Headache Nausea Pain, redness, or irritation at injection site This list may not describe all possible side effects. Call your doctor for medical advice about side effects. You may report side effects to FDA at 1-800-FDA-1088. Where should I keep my medication? Keep out of the reach of children and pets. Store in a refrigerator. Do not freeze. Do not shake. Protect from light. Keep this medication in the original container until you are ready to take it. See product for storage information. Get rid of any unused medication after the expiration date. To get rid of medications that are no longer needed or have expired: Take the medication to a medication take-back  program. Check with your pharmacy or law enforcement to find a location. If you cannot return the medication, ask your pharmacist or care team how to get rid of the medication safely. NOTE: This sheet is a summary. It may not cover all possible information. If you have questions about this medicine, talk to your doctor, pharmacist, or health care provider.  2024 Elsevier/Gold Standard (2022-04-08 00:00:00)       To help prevent nausea and vomiting after your treatment, we encourage you to take your nausea medication as directed.  BELOW ARE SYMPTOMS THAT SHOULD BE REPORTED IMMEDIATELY: *FEVER GREATER THAN 100.4 F (38 C) OR HIGHER *CHILLS OR SWEATING *NAUSEA AND VOMITING THAT IS NOT CONTROLLED WITH YOUR NAUSEA MEDICATION *UNUSUAL SHORTNESS OF BREATH *UNUSUAL BRUISING OR BLEEDING *URINARY PROBLEMS (pain or burning when urinating, or frequent urination) *BOWEL PROBLEMS (unusual diarrhea, constipation, pain near the anus) TENDERNESS IN MOUTH AND THROAT WITH OR WITHOUT PRESENCE OF ULCERS (sore throat, sores in mouth, or a toothache) UNUSUAL RASH, SWELLING OR PAIN  UNUSUAL VAGINAL DISCHARGE OR ITCHING   Items with * indicate a potential emergency and should be followed up as soon as possible or go to the Emergency Department  if any problems should occur.  Please show the CHEMOTHERAPY ALERT CARD or IMMUNOTHERAPY ALERT CARD at check-in to the Emergency Department and triage nurse.  Should you have questions after your visit or need to cancel or reschedule your appointment, please contact [facility] [place] - A DEPT OF [facility] [phone]  and follow the prompts.  Office hours are 8:00 a.m. to 4:30 p.m. Monday - Friday. Please note that voicemails left after 4:00 p.m. may not be returned until the following business day.  We are closed weekends and major holidays. You have access to a nurse at all times for urgent questions. Please call the main number to the clinic  [phone] and follow the prompts.  For any non-urgent questions, you may also contact your provider using MyChart. We now offer e-Visits for anyone 88 and older to request care online for non-urgent symptoms. For details visit [email or web address].   Also download the MyChart app! Go to the app store, search "MyChart", open the app, select [place], and log in with your MyChart username and password.

## 2024-05-21 NOTE — Progress Notes (Signed)
 Patient presents today for lab work and Retacrit  injection. HGB 10.1 today. Blood pressure within parameters for injection. Vital signs stable.   Tara Holmes presents today for injection per the provider's orders.  Retacrit  administration without incident; injection site WNL; see MAR for injection details.  Patient tolerated procedure well and without incident.  No complaints at this time. Discharged from clinic ambulatory in stable condition. Alert and oriented x 3. F/U with [facility] as scheduled.

## 2024-05-21 NOTE — Progress Notes (Signed)
 Received message from patient's nephrologist (Dr. Carrolyn Clan).   Patient was wondering if she could decrease the frequency of injections due to difficulty remembering and getting to her appointments.   Therefore, we will switch her to ARANESP 40 mcg monthly (rather than Retacrit  10,000 units every 2 weeks). First dose of Aranesp will be given at injection appointment on 06/04/2024, and repeated monthly thereafter.  Sonnie Dusky, PA-C 05/21/24 4:48 PM

## 2024-05-22 ENCOUNTER — Encounter: Payer: Self-pay | Admitting: Cardiovascular Disease

## 2024-05-22 ENCOUNTER — Ambulatory Visit: Attending: Cardiovascular Disease | Admitting: Cardiovascular Disease

## 2024-05-22 VITALS — BP 120/46 | HR 68 | Ht 63.0 in | Wt 146.0 lb

## 2024-05-22 DIAGNOSIS — I10 Essential (primary) hypertension: Secondary | ICD-10-CM | POA: Insufficient documentation

## 2024-05-22 DIAGNOSIS — E785 Hyperlipidemia, unspecified: Secondary | ICD-10-CM | POA: Insufficient documentation

## 2024-05-22 NOTE — Progress Notes (Unsigned)
 Patient ID: Tara Holmes, female   DOB: 02-21-1946, 78 y.o.   MRN: [id number]        Primary M.D.: Dr. Theoplis Fix in [facility]  HPI: Tara Holmes is a 78 y.o. female who presents to the office today for an 7 month follow-up cardiology/sleep evaluation.  [name] has a long history of hypertension, family history for diabetes mellitus, hyperlipidemia, obesity, and is status post clipping of remote left brain aneurysm. She has a history of obstructive sleep apnea which was originally diagnosed over 10 years ago. This study had been done in [place] .  She was able to obtain a new CPAP machine and now has a ResMed air since 10.  Mitts to using CPAP with 100% compliance.  Typically she goes to bed between 9 and 9:30 PM.  She believes her sleep is restorative.  She occasionally takes a daytime nap.  She has a history of hyperlipidemia for which she's been on Mevacor 40 mg, and fish oil capsules.  She is no longer taking WelChol.  Her blood pressure has been treated with losartan 100 mg, amlodipine 10 mg, carvedilol 25 mg twice a day and furosemide 40 mg. She is diabetic and now is on glimepiride and Tradjenta.  She was  evaluated at [facility] emergency room for chest pain on 04/02/2017.  Her chest pain was nonexertional and not described as a tightness.  It was felt to be somewhat atypical.  She saw her primary physician in follow-up the following day.  Her pain was described as sharp, which occurred in different locations on her chest nor not associated with radiation to her arms or jaw.  It would last 5 minutes and then dissipate spontaneously.    Since I saw her, she also has been evaluated at [facility] for chronic kidney disease stage IV.  There has been discussion for possible AV fistula formation in the very near future.  At her last evaluation.  Her serum creatinine was 3.6.  She had seen Dr. Carlon Chester and recently saw Dr. Feliciano Horner.   I saw her in April 2018 in follow-up  of her ER evaluation.  I scheduled her for Lexiscan  Myoview  study which was done on 04/19/2017.  This revealed normal perfusion without scar or ischemia.  Ejection fraction was 55%.  I reviewed her most recent echo which showed LVH with normal systolic function with grade 2 diastolic dysfunction, aortic valve sclerosis without stenosis, and mitral annular calcification with trivial PR.  She has continued to use her CPAP.  She admits to 100% compliance.  I saw her in July 2018.  She developed progressive chronic kidney disease and had undergone insertion of an AV fistula in her left arm bands by Dr. Vernard Goldberg.  Has been on amlodipine 5 mg, low 5 mg twice a day, furosemide 40 mg daily but pressure control.  She is diabetic he is on gloimeperide  but is no longer taking Tradjenta.  He used to use CPAP and her DME company is [employer] in [place].  A download obtained in the office today from April 18 through May 04, 2018 demonstrated continued compliance and she was only sleeping 6 hours and 23 minutes with CPAP on a daily basis.  At a auto set up her 95th percentile pressure was16.8 with a maximum average pressure of 18.3.  She was using a fullface mask.  AHI was excellent at 1.3.   She was seen on September 03, 2019 for follow-up evaluation..  Over the prior year, her  renal function had stabilized and she has not required initiation of dialysis.  She is followed by Dr. Frieda Jew.Aaron Aas She was compliant with CPAP therapy but often puts her hair is in curlers approximately every 7 to 10 days and therefore has not been sleeping with the CPAP mask when her hairs are in curlers.  A download was obtained in the office from August 13 through August 30, 2019.  She was compliant for 26 of 30 days with usage in the 4 days which she did not use therapy was when she had curlers in her hair at night.  She was averaging 8 hours and 21 minutes.  AHI is excellent at 1.3 and her CPAP auto settings range from 11 to 20 cm.  Her 95th  percentile pressure is 16.6 with a maximum average pressure of 18.4.  She uses a ResMed full facemask.  An Epworth Sleepiness Scale score was recalculated  and this endorsed at 3 arguing against daytime sleepiness.   She went to the emergency room on October 28, 2019 and was hospitalized overnight at [facility] for atypical chest pain.  Chest x-ray, EKG, serial troponins are unremarkable and she was discharged the following day.  She was concerned about this evaluation and wanted my opinion regarding her assessment.  It was felt most likely her chest pain was musculoskeletal in etiology.  I saw her in December 2020.  At that time she had  improved compliance with her CPAP.  A new download was obtained from November 7 through November 24, 2019 which showed 90% compliance, averaging 7 hours and 55 minutes.  Her CPAP auto was set at a minimum pressure of 11 and maximum of 20, and her 95th percentile pressure was 16.6 with a maximum average pressure of 18.6.  She was sleeping well.    Since I saw her, she has continued to be followed by Dr. Carrolyn Clan for her stage IV chronic kidney disease.  Laboratory in 09/22/2020 showed a hemoglobin of 9.4 hematocrit 30.1.  Creatinine in September 2021 was 3.23 which had increased from June 2021 at 3.0.  She was recently started on Sensipar which she takes on Monday Wednesday and Fridays.  I last saw her in October 05, 2020 at which time she continued to well and denied any chest pain or shortness of breath.  Her AV fistula is 76 years old unfortunately she has not required use.  A download of her CPAP therapy in May 2021 showed she was meeting compliance and AHI was 1.4 with her auto mode of 10-20 with a set 95 percentile pressure of 17.1.  Attempt at obtaining a download did not reveal any data since May.  However she admits to continued use.  It appears that both she and her husband had refused supplies at the end of May since they had adequate supplies already  from the DME company. She sees Dr. Pomposini for primary care in [place].  Recent LDL cholesterol was 88 in June 2021 rosuvastatin  10 mg.    I saw her on October 05, 2020 at which time she continued to do well.  She has continued to be followed by Dr. Carrolyn Clan in [place] for her CKD and was started on Cinacalat 30 mg on Monday Wednesday and Friday.  She is now followed by Dr. Mason Sole in [place] for primary care.    When I saw her on June 02, 2021 she felt well and was on  amlodipine 5 mg, carvedilol 25 mg twice a  day, losartan 100 mg daily and furosemide 40 mg daily for hypertension.  She is diabetic on glimepiride 2 mg.  For her hyperlipidemia she is on Zetia 10 mg, rosuvastatin  at 10 mg, in addition to Lovaza 1 g daily.  She continues to use CPAP and I obtained a new download from her DME company on May 16 through June 01, 2021.  Compliance is excellent averaging 7 hours and 39 minutes.  Her CPAP has been set at a minimum pressure of 12 and maximum of 20.  AHI is 3.0.  She is achieving high pressures with 95th percentile pressure 18.3 and maximum average pressure at 19.1.  She is unaware of breakthrough snoring.  A new Epworth scale was calculated in the office today and this was negative for residual daytime sleepiness calculated at 9.  During that evaluation, I recommended changing her auto pressure to a minimum pressure of 14 with maximum of 20.  Her DME company is [employer] in [place] .  I last saw her on August 17, 2022.  Since I last saw her, we were able to link her machine to our office.  She denies any chest pain or shortness of breath.  She has improved her diet.  Her CKD has been stable with most recent serum creatinine on August 02, 2022 at 2.77 consistent with stage IV CKD.  Her AV fistula in her left arm has never been used and has been in place for 3 years.  She sees Dr. Devon Fogo the vascular surgeon at [facility] on a yearly basis.  She continues to use CPAP  and admits to excellent compliance.  Her machine is no longer downloadable due to 5G upgrade.  She states she gets her hair done every 2 weeks and uses CPAP 100% of the time with the exception of for 2 days when she gets her hair done when she has curlers in her hair.  As result she averages 26 out of 30 nights of CPAP use per month.  She is sleeping well.  She is unaware of breakthrough snoring.  She denies residual daytime sleepiness.    Ms. Sako received a new replacement CPAP AirSense 11 AutoSet unit on August 18, 2022.  Her initial download from August 31 through September 16, 2022 has verified compliance with average use at 6 hours and 48 minutes per night.  Her DME company is [employer] in [place] .  She uses her CPAP every night with the exception of when she gets her hair done and has curlers in her hair.  On this download usage was 26 of 30 days.  At a pressure range of 12 to 20 cm of water, AHI was 3.6.  Her 95th percentile pressure was 17.9 and her maximum average pressure was 18.9/h.  Presently, she feels well.  She denies chest pain or shortness of breath.  She continues to be followed by Dr. Carrolyn Clan for her kidney disease.  Most recent creatinine had increased to 2.95 on November 22, 2022.  She denies chest pain.  At times there is some trace leg swelling.  She continues to see Dr.Shah in [place] for primary care.  She presents for evaluation.   Past Medical History:  Diagnosis Date   Anemia    Aneurysm (HCC) 1978   Arthritis    Chronic kidney disease    ESRD   Diabetes (HCC)    Encephalomalacia    GERD (gastroesophageal reflux disease)    Gout    Head  injury 10/20/2020   d/t fall   Heart disease    Hypertension    Mild memory disturbance    Seizures (HCC)    Sleep apnea     Past Surgical History:  Procedure Laterality Date   AV FISTULA INSERTION W/ RF MAGNETIC GUIDANCE Left 08/08/2018   Procedure: AV FISTULA INSERTION W/RF MAGNETIC GUIDANCE;  Surgeon: Jackquelyn Mass, MD;   Location: [facility] INVASIVE CV LAB;  Service: Cardiovascular;  Laterality: Left;   BREAST SURGERY Left    fibroid   TONSILLECTOMY      Allergies  Allergen Reactions   Amoxicillin Other (See Comments)    Pt states allergic reaction in vaginal region. Has patient had a PCN reaction causing immediate rash, facial/tongue/throat swelling, SOB or lightheadedness with hypotension: No Has patient had a PCN reaction causing severe rash involving mucus membranes or skin necrosis: No Has patient had a PCN reaction that required hospitalization: No Has patient had a PCN reaction occurring within the last 10 years: No Yeast infection reaction ONLY If all of the above answers are "NO", then may proceed with Ce   Atorvastatin Other (See Comments)    MUSCLE ACHES    Prednisone Other (See Comments)    ELEVATED BLOOD SUGAR   Iodinated Contrast Media Other (See Comments)    PATIENT IS UNAWARE OF THIS ALLERGY OR ANY REACTION TO THIS MEDICATION. Other reaction(s): Other (See Comments) PATIENT IS UNAWARE OF THIS ALLERGY OR ANY REACTION TO THIS MEDICATION.    Current Outpatient Medications  Medication Sig Dispense Refill   ACCU-CHEK COMPACT PLUS test strip CHECK BLOOD SUGAR ONCE A DAY  3   allopurinol (ZYLOPRIM) 100 MG tablet Take 100 mg by mouth daily.     amLODipine (NORVASC) 2.5 MG tablet Take 2.5 mg by mouth daily.     carvedilol (COREG) 25 MG tablet Take 25 mg by mouth 2 (two) times daily.      cholecalciferol (VITAMIN D3) 25 MCG (1000 UNIT) tablet Take 1,000 Units by mouth daily.     cinacalcet (SENSIPAR) 30 MG tablet Take 30 mg by mouth 3 (three) times a week. M W F     ezetimibe (ZETIA) 10 MG tablet Take 10 mg by mouth daily.     ferrous sulfate 325 (65 FE) MG EC tablet Take 325 mg by mouth every evening.  0   furosemide (LASIX) 40 MG tablet Take 40 mg by mouth daily.     losartan (COZAAR) 100 MG tablet Take 100 mg by mouth daily.     oxybutynin (DITROPAN) 5 MG tablet Take 5 mg by mouth 2 (two)  times daily.      rosuvastatin  (CRESTOR ) 10 MG tablet Take 10 mg by mouth daily.     No current facility-administered medications for this visit.    Social History   Socioeconomic History   Marital status: Married    Spouse name: Gaylin Ke   Number of children: 2   Years of education: MA   Highest education level: Not on file  Occupational History   Occupation: Retired  Tobacco Use   Smoking status: Never   Smokeless tobacco: Never  Vaping Use   Vaping status: Never Used  Substance and Sexual Activity   Alcohol use: No   Drug use: Never   Sexual activity: Not on file  Other Topics Concern   Not on file  Social History Narrative   Patient lives at home with spouse.   Caffeine Use: none   Social Drivers of Health  Financial Resource Strain: Not on file  Food Insecurity: Not on file  Transportation Needs: Not on file  Physical Activity: Not on file  Stress: Not on file  Social Connections: Not on file  Intimate Partner Violence: Not on file   Additional social history is notable in that she worked as a Control and instrumentation engineer.  There is no tobacco, alcohol.  Family History  Problem Relation Age of Onset   Heart attack Mother    Diabetes Mother    Colon cancer Father     ROS General: Negative; No fevers, chills, or night sweats;  HEENT: Negative; No changes in vision or hearing, sinus congestion, difficulty swallowing Pulmonary: Negative; No cough, wheezing, shortness of breath, hemoptysis Cardiovascular: Negative; No chest pain, presyncope, syncope, palpitations  left arm AV fistula GI: Negative; No nausea, vomiting, diarrhea, or abdominal pain GU: Negative; No dysuria, hematuria, or difficulty voiding Musculoskeletal: Negative; no myalgias, joint pain, or weakness Hematologic/Oncology: Negative; no easy bruising, bleeding Endocrine: Negative; no heat/cold intolerance; no diabetes Neuro: Negative; no changes in balance, headaches Skin: Negative; No rashes or  skin lesions Psychiatric: Negative; No behavioral problems, depression Sleep: Positive for obstructive sleep apnea.  No bruxism, restless legs, hypnogognic hallucinations, no cataplexy Other comprehensive 14 point system review is negative.   PE BP (!) 120/46   Pulse 68   Ht 5\' 3"  (1.6 m)   Wt 146 lb (66.2 kg)   SpO2 99%   BMI 25.86 kg/m    Repeat blood pressure was 128/80  Wt Readings from Last 3 Encounters:  05/22/24 146 lb (66.2 kg)  03/19/24 148 lb 3.2 oz (67.2 kg)  12/14/23 149 lb (67.6 kg)    General: Alert, oriented, no distress.  Skin: normal turgor, no rashes, warm and dry HEENT: Normocephalic, atraumatic. Pupils equal round and reactive to light; sclera anicteric; extraocular muscles intact;  Nose without nasal septal hypertrophy Mouth/Parynx benign; Mallinpatti scale 4 Neck: No JVD, no carotid bruits; normal carotid upstroke Lungs: clear to ausculatation and percussion; no wheezing or rales Chest wall: without tenderness to palpitation Heart: PMI not displaced, RRR, s1 s2 normal, 1/6 systolic murmur, no diastolic murmur, no rubs, gallops, thrills, or heaves Abdomen: soft, nontender; no hepatosplenomehaly, BS+; abdominal aorta nontender and not dilated by palpation. Back: no CVA tenderness Pulses 2+ Musculoskeletal: full range of motion, normal strength, no joint deformities Extremities: Trace ankle edema; no clubbing cyanosis, Homan's sign negative  Neurologic: grossly nonfocal; Cranial nerves grossly wnl Psychologic: Normal mood and affect   March 16, 2023 ECG (independently read by me):   August 17, 2022   ECG (independently read by me): NSR at 63  June 02, 2021 ECG (independently read by me):  NSR at 60; T wave abnormality , no ectopy  October 2021 ECG (independently read by me): NSR at 60; NSSTT changes; normal intervals  November 26, 2019 ECG (independently read by me): Normal sinus rhythm at 63 bpm with sinus arrhythmia.  Normal intervals.  September 03, 2019 ECG (independently read by me): Normal sinus rhythm at 69 bpm.  Mild T wave abnormality in aVL.  No ectopy.  ECG of 08/01/2018 was reviewed independently by me and shows sinus bradycardia 55 bpm.  There is mild T wave abnormality in leads I and aVL.  July 2018 ECG (independently read by me): Normal sinus rhythm at 61 bpm.  T-wave abnormality in leads 1 and aVL  April 2018 ECG (independently read by me): Normal sinus rhythm at 67 bpm.  Resolution of prior  inferolateral T wave inversion, however, there is new T-wave inversion in leads 1 and aVL.  May 2017 ECG (independently read by me): Normal sinus rhythm at 66 bpm.  Inferolateral T wave abnormality.  April 2015 ECG (independently read by me): Normal sinus rhythm 81 beats per minute.  Nonspecific ST changes in leads 1, AVL and V6.   12/31/2013 ECG (independently read by me): Sinus rhythm. Nondiagnostic T changes.  LABS:    Latest Ref Rng & Units 05/09/2024    9:49 AM 03/12/2024    9:03 AM 08/24/2023    9:48 AM  BMP  Glucose 70 - 99 mg/dL 161  096    045  409   BUN 8 - 23 mg/dL 65  53    52  70   Creatinine 0.44 - 1.00 mg/dL 8.11  9.14    7.82  9.56   Sodium 135 - 145 mmol/L 138  139    140  139   Potassium 3.5 - 5.1 mmol/L 3.8  3.7    3.7  3.9   Chloride 98 - 111 mmol/L 100  100    100  100   CO2 22 - 32 mmol/L 30  29    31  31    Calcium  8.9 - 10.3 mg/dL 9.5  9.5    9.7    9.7  9.9        Latest Ref Rng & Units 05/09/2024    9:49 AM 03/12/2024    9:03 AM 08/24/2023    9:48 AM  Hepatic Function  Total Protein 6.5 - 8.1 g/dL  7.1  7.6   Albumin 3.5 - 5.0 g/dL 3.6  3.3    3.3  3.7   AST 15 - 41 U/L  15  38   ALT 0 - 44 U/L  17  46   Alk Phosphatase 38 - 126 U/L  74  64   Total Bilirubin 0.0 - 1.2 mg/dL  0.8  0.9       Latest Ref Rng & Units 05/21/2024    8:34 AM 05/09/2024    9:50 AM 05/07/2024   11:13 AM  CBC  WBC 4.0 - 10.5 K/uL 5.3  6.0  5.6   Hemoglobin 12.0 - 15.0 g/dL 21.3  08.6  9.8   Hematocrit 36.0 -  46.0 % 32.9  34.3  31.9   Platelets 150 - 400 K/uL 177  179  178     BNP No results found for: "PROBNP"  Lipid Panel  No results found for: "CHOL", "TRIG", "HDL", "CHOLHDL", "VLDL", "LDLCALC"   RADIOLOGY: No results found.  IMPRESSION:  1. Essential hypertension   2. Hyperlipidemia with target LDL less than 70     ASSESSMENT AND PLAN: Ms. Bouchie is a 78 year-old African-American female who has a history of diabetes mellitus, hyperlipidemia, hypertension, obstructive sleep apnea and progressive stage IV CKD.  She underwent insertion of an AV fistula in August 2019 but fortunately has not not had to initiate dialysis therapy.  She developed atypical chest pain leading to an overnight hospitalization at [facility] which most likely was musculoskeletal in etiology.  Her blood pressure today is stable on her regimen of amlodipine 2.5 mg, carvedilol 25 mg twice a day and losartan 100 mg daily.  She has had her left arm AV fistula in place for 3 years and fortunately she has not yet had to use this.  Her most recent renal function is stable with a creatinine of 2.77 on  July 26, 2022 with GFR estimate at 18.  She continues to be on Zetia 10 mg and rosuvastatin  10 mg for hyperlipidemia.  Dr. Mason Sole has been checking laboratory.  She is diabetic and previously was on glimepiride.  She admits to continued CPAP use on a daily basis except for 2 nights every 2 weeks when she gets her hair done and there are rollers in her hair.  [employer] in [place] is her DME company.  Since her last evaluation, she received a new ResMed AirSense 11 AutoSet unit on August 18, 2022.  She is meeting compliance standards with usage every night with the exception of the days she has currently generic air.  She met compliance standards on the first month of therapy.  Her most recent download from February 27 through March 15, 2023 reveals an AHI at 4.2 with her 95th percentile pressure at 19.4 and maximum average pressure at  19.6.  I will change her pressure settings which currently are at 12 to 20 cm of water to a range of 15 to 20 cm of water which should improve her AHI.  She is followed closely by Dr. Carrolyn Clan in [place] for her stage IV CKD with most recent creatinine elevated to 2.95 given estimated creatinine clearance at 16.  Dr. Mason Sole in [place] is her primary physician who checks laboratory.  She does have some trace edema of her ankles.  I have suggested she wear compression stockings.  She has diet-controlled prediabetes.  She will be undergoing follow-up laboratory with her primary physician.  I will see her in 1 year for reevaluation or sooner as needed.   Millicent Ally, MD, [facility]  05/22/2024 3:18 PM

## 2024-05-22 NOTE — Patient Instructions (Signed)
 Medication Instructions:   Your physician recommends that you continue on your current medications as directed. Please refer to the Current Medication list given to you today.  *If you need a refill on your cardiac medications before your next appointment, please call your pharmacy*   Lab Work: NONE ORDERED  TODAY    If you have labs (blood work) drawn today and your tests are completely normal, you will receive your results only by: MyChart Message (if you have MyChart) OR A paper copy in the mail If you have any lab test that is abnormal or we need to change your treatment, we will call you to review the results.  Testing/Procedures: NONE ORDERED  TODAY    Follow-Up: At [facility], you and your health needs are our priority.  As part of our continuing mission to provide you with exceptional heart care, our providers are all part of one team.  This team includes your primary Cardiologist (physician) and Advanced Practice Providers or APPs (Physician Assistants and Nurse Practitioners) who all work together to provide you with the care you need, when you need it.  Your next appointment:  AS NEEDED FOR SLEEP WITH DR TURNER   1 year(s)   Provider:    Vishnu Mallipeddi, MD    We recommend signing up for the patient portal called "MyChart".  Sign up information is provided on this After Visit Summary.  MyChart is used to connect with patients for Virtual Visits (Telemedicine).  Patients are able to view lab/test results, encounter notes, upcoming appointments, etc.  Non-urgent messages can be sent to your provider as well.   To learn more about what you can do with MyChart, go to [email or web address].    Other Instructions

## 2024-05-24 ENCOUNTER — Encounter: Payer: Self-pay | Admitting: Cardiovascular Disease

## 2024-06-04 ENCOUNTER — Inpatient Hospital Stay

## 2024-06-04 VITALS — BP 132/86 | HR 63 | Temp 98.2°F | Resp 19

## 2024-06-04 DIAGNOSIS — D649 Anemia, unspecified: Secondary | ICD-10-CM

## 2024-06-04 DIAGNOSIS — D631 Anemia in chronic kidney disease: Secondary | ICD-10-CM

## 2024-06-04 DIAGNOSIS — I120 Hypertensive chronic kidney disease with stage 5 chronic kidney disease or end stage renal disease: Secondary | ICD-10-CM | POA: Diagnosis not present

## 2024-06-04 LAB — COMPREHENSIVE METABOLIC PANEL WITH GFR
ALT: 75 U/L — ABNORMAL HIGH (ref 0–44)
AST: 48 U/L — ABNORMAL HIGH (ref 15–41)
Albumin: 3.6 g/dL (ref 3.5–5.0)
Alkaline Phosphatase: 84 U/L (ref 38–126)
Anion gap: 11 (ref 5–15)
BUN: 72 mg/dL — ABNORMAL HIGH (ref 8–23)
CO2: 29 mmol/L (ref 22–32)
Calcium: 9.3 mg/dL (ref 8.9–10.3)
Chloride: 100 mmol/L (ref 98–111)
Creatinine, Ser: 3.38 mg/dL — ABNORMAL HIGH (ref 0.44–1.00)
GFR, Estimated: 14 mL/min — ABNORMAL LOW (ref >60)
Glucose, Bld: 107 mg/dL — ABNORMAL HIGH (ref 70–99)
Potassium: 3.7 mmol/L (ref 3.5–5.1)
Sodium: 140 mmol/L (ref 135–145)
Total Bilirubin: 0.9 mg/dL (ref 0.0–1.2)
Total Protein: 7.5 g/dL (ref 6.5–8.1)

## 2024-06-04 LAB — CBC WITH DIFFERENTIAL/PLATELET
Abs Immature Granulocytes: 0.01 10*3/uL (ref 0.00–0.07)
Basophils Absolute: 0 10*3/uL (ref 0.0–0.1)
Basophils Relative: 0 %
Eosinophils Absolute: 0.1 10*3/uL (ref 0.0–0.5)
Eosinophils Relative: 3 %
HCT: 34.3 % — ABNORMAL LOW (ref 36.0–46.0)
Hemoglobin: 10.3 g/dL — ABNORMAL LOW (ref 12.0–15.0)
Immature Granulocytes: 0 %
Lymphocytes Relative: 27 %
Lymphs Abs: 1.3 10*3/uL (ref 0.7–4.0)
MCH: 27.5 pg (ref 26.0–34.0)
MCHC: 30 g/dL (ref 30.0–36.0)
MCV: 91.7 fL (ref 80.0–100.0)
Monocytes Absolute: 0.5 10*3/uL (ref 0.1–1.0)
Monocytes Relative: 9 %
Neutro Abs: 3 10*3/uL (ref 1.7–7.7)
Neutrophils Relative %: 61 %
Platelets: 164 10*3/uL (ref 150–400)
RBC: 3.74 MIL/uL — ABNORMAL LOW (ref 3.87–5.11)
RDW: 14.5 % (ref 11.5–15.5)
WBC: 5 10*3/uL (ref 4.0–10.5)
nRBC: 0 % (ref 0.0–0.2)

## 2024-06-04 LAB — IRON AND TIBC
Iron: 64 ug/dL (ref 28–170)
Saturation Ratios: 26 % (ref 10.4–31.8)
TIBC: 245 ug/dL — ABNORMAL LOW (ref 250–450)
UIBC: 181 ug/dL

## 2024-06-04 LAB — FERRITIN: Ferritin: 422 ng/mL — ABNORMAL HIGH (ref 11–307)

## 2024-06-04 MED ORDER — DARBEPOETIN ALFA 40 MCG/0.4ML IJ SOSY
40.0000 ug | PREFILLED_SYRINGE | Freq: Once | INTRAMUSCULAR | Status: AC
  Administered 2024-06-04: 40 ug via SUBCUTANEOUS
  Filled 2024-06-04: qty 0.4

## 2024-06-04 NOTE — Patient Instructions (Signed)
 [facility] [place] - A DEPT OF [facility]  Discharge Instructions: Thank you for choosing [facility] to provide your oncology and hematology care.  If you have a lab appointment with the Cancer Center - please note that after April 8th, 2024, all labs will be drawn in the cancer center.  You do not have to check in or register with the main entrance as you have in the past but will complete your check-in in the cancer center.  Wear comfortable clothing and clothing appropriate for easy access to any Portacath or PICC line.   We strive to give you quality time with your provider. You may need to reschedule your appointment if you arrive late (15 or more minutes).  Arriving late affects you and other patients whose appointments are after yours.  Also, if you miss three or more appointments without notifying the office, you may be dismissed from the clinic at the provider's discretion.      For prescription refill requests, have your pharmacy contact our office and allow 72 hours for refills to be completed.    Today you received the following :  Aranesp.  Darbepoetin Alfa Injection What is this medication? DARBEPOETIN ALFA (dar be POE e tin AL fa) treats low levels of red blood cells (anemia) caused by kidney disease or chemotherapy. It works by Systems analyst make more red blood cells, which reduces the need for blood transfusions. This medicine may be used for other purposes; ask your health care provider or pharmacist if you have questions. COMMON BRAND NAME(S): Aranesp What should I tell my care team before I take this medication? They need to know if you have any of these conditions: Blood clots Cancer Heart disease High blood pressure On dialysis Seizures Stroke An unusual or allergic reaction to darbepoetin, latex, other medications, foods, dyes, or preservatives Pregnant or trying to get pregnant Breast-feeding How should I use this  medication? This medication is injected into a vein or under the skin. It is usually given by a care team in a hospital or clinic setting. It may also be given at home. If you get this medication at home, you will be taught how to prepare and give it. Use exactly as directed. Take it as directed on the prescription label at the same time every day. Keep taking it unless your care team tells you to stop. It is important that you put your used needles and syringes in a special sharps container. Do not put them in a trash can. If you do not have a sharps container, call your pharmacist or care team to get one. A special MedGuide will be given to you by the pharmacist with each prescription and refill. Be sure to read this information carefully each time. Talk to your care team about the use of this medication in children. While this medication may be used in children as young as 1 month of age for selected conditions, precautions do apply. Overdosage: If you think you have taken too much of this medicine contact a poison control center or emergency room at once. NOTE: This medicine is only for you. Do not share this medicine with others. What if I miss a dose? If you miss a dose, take it as soon as you can. If it is almost time for your next dose, take only that dose. Do not take double or extra doses. What may interact with this medication? Epoetin alfa Methoxy  polyethylene glycol-epoetin beta This list may not describe all possible interactions. Give your health care provider a list of all the medicines, herbs, non-prescription drugs, or dietary supplements you use. Also tell them if you smoke, drink alcohol, or use illegal drugs. Some items may interact with your medicine. What should I watch for while using this medication? Visit your care team for regular checks on your progress. Check your blood pressure as directed. Know what your blood pressure should be and when to contact your care team. Your  condition will be monitored carefully while you are receiving this medication. You may need blood work while taking this medication. What side effects may I notice from receiving this medication? Side effects that you should report to your care team as soon as possible: Allergic reactions--skin rash, itching, hives, swelling of the face, lips, tongue, or throat Blood clot--pain, swelling, or warmth in the leg, shortness of breath, chest pain Heart attack--pain or tightness in the chest, shoulders, arms, or jaw, nausea, shortness of breath, cold or clammy skin, feeling faint or lightheaded Increase in blood pressure Rash, fever, and swollen lymph nodes Redness, blistering, peeling, or loosening of the skin, including inside the mouth Seizures Stroke--sudden numbness or weakness of the face, arm, or leg, trouble speaking, confusion, trouble walking, loss of balance or coordination, dizziness, severe headache, change in vision Side effects that usually do not require medical attention (report to your care team if they continue or are bothersome): Cough Stomach pain Swelling of the ankles, hands, or feet This list may not describe all possible side effects. Call your doctor for medical advice about side effects. You may report side effects to FDA at 1-800-FDA-1088. Where should I keep my medication? Keep out of the reach of children and pets. Store in a refrigerator. Do not freeze. Do not shake. Protect from light. Keep this medication in the original container until you are ready to take it. See product for storage information. Get rid of any unused medication after the expiration date. To get rid of medications that are no longer needed or have expired: Take the medication to a medication take-back program. Check with your pharmacy or law enforcement to find a location. If you cannot return the medication, ask your pharmacist or care team how to get rid of the medication safely. NOTE: This sheet  is a summary. It may not cover all possible information. If you have questions about this medicine, talk to your doctor, pharmacist, or health care provider.  2024 Elsevier/Gold Standard (2022-04-06 00:00:00)     To help prevent nausea and vomiting after your treatment, we encourage you to take your nausea medication as directed.  BELOW ARE SYMPTOMS THAT SHOULD BE REPORTED IMMEDIATELY: *FEVER GREATER THAN 100.4 F (38 C) OR HIGHER *CHILLS OR SWEATING *NAUSEA AND VOMITING THAT IS NOT CONTROLLED WITH YOUR NAUSEA MEDICATION *UNUSUAL SHORTNESS OF BREATH *UNUSUAL BRUISING OR BLEEDING *URINARY PROBLEMS (pain or burning when urinating, or frequent urination) *BOWEL PROBLEMS (unusual diarrhea, constipation, pain near the anus) TENDERNESS IN MOUTH AND THROAT WITH OR WITHOUT PRESENCE OF ULCERS (sore throat, sores in mouth, or a toothache) UNUSUAL RASH, SWELLING OR PAIN  UNUSUAL VAGINAL DISCHARGE OR ITCHING   Items with * indicate a potential emergency and should be followed up as soon as possible or go to the Emergency Department if any problems should occur.  Please show the CHEMOTHERAPY ALERT CARD or IMMUNOTHERAPY ALERT CARD at check-in to the Emergency Department and triage nurse.  Should you have questions  after your visit or need to cancel or reschedule your appointment, please contact [facility] [place] - A DEPT OF [facility] [phone]  and follow the prompts.  Office hours are 8:00 a.m. to 4:30 p.m. Monday - Friday. Please note that voicemails left after 4:00 p.m. may not be returned until the following business day.  We are closed weekends and major holidays. You have access to a nurse at all times for urgent questions. Please call the main number to the clinic [phone] and follow the prompts.  For any non-urgent questions, you may also contact your provider using MyChart. We now offer e-Visits for anyone 14 and older to request care online for non-urgent  symptoms. For details visit [email or web address].   Also download the MyChart app! Go to the app store, search "MyChart", open the app, select [place], and log in with your MyChart username and password.

## 2024-06-04 NOTE — Progress Notes (Signed)
 Hemoglobin today is 10.3.  We will proceed with Aranesp injection per provider orders.   Patient tolerated injection with no complaints voiced.  Site clean and dry with no bruising or swelling noted.  No complaints of pain.  Discharged with vital signs stable and no signs or symptoms of distress noted.

## 2024-06-07 LAB — IMMUNOFIXATION ELECTROPHORESIS
IgA: 330 mg/dL (ref 64–422)
IgG (Immunoglobin G), Serum: 1774 mg/dL — ABNORMAL HIGH (ref 586–1602)
IgM (Immunoglobulin M), Srm: 165 mg/dL (ref 26–217)
Total Protein ELP: 7.2 g/dL (ref 6.0–8.5)

## 2024-06-18 ENCOUNTER — Inpatient Hospital Stay

## 2024-07-01 ENCOUNTER — Other Ambulatory Visit: Payer: Self-pay | Admitting: Physician Assistant

## 2024-07-01 DIAGNOSIS — D631 Anemia in chronic kidney disease: Secondary | ICD-10-CM

## 2024-07-01 NOTE — Progress Notes (Unsigned)
 [facility] [address], [name]   CLINIC:  Medical Oncology/Hematology  PCP:  Maree Isles, MD [address] 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 female returns for routine follow-up of anemia of CKD.  She was last seen by Pleasant Barefoot PA-C on 03/19/2024.  At today's visit, she reports feeling fairly well, apart from some recent episodes of gout.  No recent hospitalizations, surgeries, or changes in baseline health status.  She is tolerating Aranesp  well without any major side effects.   Blood pressure has been at goal.  No symptoms concerning for DVT or PE.    She  denies any bleeding episodes.  No fatigue, pica, headaches, lightheadedness, syncope, chest pain, or dyspnea on exertion.    She continues to take iron tablet daily.    She has 75% energy and 75% appetite. She endorses that she is maintaining a stable weight.  ASSESSMENT & PLAN:  1.  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 Rachele. - Hematology workup showed elevated B12 and elevated MMA (likely artifact in the setting of CKD stage IV/V).  Normal folate. - SPEP negative for M spike.  Minimally elevated FLC ratio 1.80 (kappa 116.0, lambda 64.5), likely artifact in the setting of CKD stage IV/V.  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 - ESA therapy with Retacrit  initiated at [facility] around March 2024 - Switch to Aranesp  in June 2025 (per request of Dr. Rachele).  Currently receiving Aranesp  40 mcg every 4 weeks for Hgb <11.0. - Colonoscopy (2023) by Dr. Donny in [place]. - Takes ferrous sulfate 325 mg daily.   - Denies BRBPR/melena. - Most recent iron panel (06/04/2024): Ferritin 422, iron saturation 26% - Labs today (07/02/2024):  Hgb 9.9/MCV 91.7.  Baseline CKD stage IV/V (creatinine 2.86/GFR 17) - PLAN: Hemoglobin not at goal.   Increase Aranesp  to 60 mcg every 4 weeks for Hgb <11.0. - RTC 3 months - Continue oral iron supplement, but take every other day. If she continues to have functional iron deficiency (iron saturation <20%), we will consider IV iron repletion. - Check serum immunofixation to rule out plasma cell disorder in the setting of elevated FLC ratio.  2.  Social/Family History:  - Lives at home with her husband.  She independent of ADLS and iADLS. Retired Engineer, site. No chemical exposure. No tobacco use. No ETOH. - Mother had breast cancer. Father had colon cancer. No family history of anemia.   PLAN SUMMARY:  >> CBC + Aranesp  every 4 weeks >> Same day labs (CBC/D, CMP, ferritin, iron/TIBC) + OFFICE visit + INJECTION in 3 months     REVIEW OF SYSTEMS:  Review of Systems  Constitutional:  Negative for appetite change, chills, diaphoresis, fatigue, fever and unexpected weight change.  HENT:   Negative for lump/mass and nosebleeds.   Eyes:  Negative for eye problems.  Respiratory:  Negative for cough, hemoptysis and shortness of breath.   Cardiovascular:  Negative for chest pain, leg swelling and palpitations.  Gastrointestinal:  Negative for abdominal pain, blood in stool, constipation, diarrhea, nausea and vomiting.  Genitourinary:  Negative for hematuria.   Skin: Negative.   Neurological:  Negative for dizziness, headaches and light-headedness.  Hematological:  Does not bruise/bleed easily.  PHYSICAL EXAM:  ECOG PERFORMANCE STATUS: 0 - Asymptomatic  Vitals:   07/02/24 1435  BP: 135/61  Pulse: 64  Resp: 16  Temp: 98.1 F (36.7 C)  SpO2: 99%    Filed Weights   07/02/24 1435  Weight: 146 lb 13.2 oz (66.6 kg)    Physical Exam Constitutional:      Appearance: Normal appearance. She is normal weight.  Cardiovascular:     Rate and Rhythm: Normal rate.     Heart sounds: Normal  heart sounds.  Pulmonary:     Breath sounds: Normal breath sounds.  Neurological:     General: No focal deficit present.     Mental Status: Mental status is at baseline.  Psychiatric:        Behavior: Behavior normal. Behavior is cooperative.    PAST MEDICAL/SURGICAL HISTORY:  Past Medical History:  Diagnosis Date   Anemia    Aneurysm (HCC) 1978   Arthritis    Chronic kidney disease    ESRD   Diabetes (HCC)    Encephalomalacia    GERD (gastroesophageal reflux disease)    Gout    Head injury 10/20/2020   d/t fall   Heart disease    Hypertension    Mild memory disturbance    Seizures (HCC)    Sleep apnea    Past Surgical History:  Procedure Laterality Date   AV FISTULA INSERTION W/ RF MAGNETIC GUIDANCE Left 08/08/2018   Procedure: AV FISTULA INSERTION W/RF MAGNETIC GUIDANCE;  Surgeon: Jama Cordella MATSU, MD;  Location: [facility] INVASIVE CV LAB;  Service: Cardiovascular;  Laterality: Left;   BREAST SURGERY Left    fibroid   TONSILLECTOMY      SOCIAL HISTORY:  Social History   Socioeconomic History   Marital status: Married    Spouse name: [place]   Number of children: 2   Years of education: MA   Highest education level: Not on file  Occupational History   Occupation: Retired  Tobacco Use   Smoking status: Never   Smokeless tobacco: Never  Vaping Use   Vaping status: Never Used  Substance and Sexual Activity   Alcohol use: No   Drug use: Never   Sexual activity: Not on file  Other Topics Concern   Not on file  Social History Narrative   Patient lives at home with spouse.   Caffeine Use: none   Social Drivers of Corporate investment banker Strain: Not on file  Food Insecurity: Not on file  Transportation Needs: Not on file  Physical Activity: Not on file  Stress: Not on file  Social Connections: Not on file  Intimate Partner Violence: Not on file    FAMILY HISTORY:  Family History  Problem Relation Age of Onset   Heart attack Mother    Diabetes  Mother    Colon cancer Father     CURRENT MEDICATIONS:  Outpatient Encounter Medications as of 07/02/2024  Medication Sig Note   ACCU-CHEK COMPACT PLUS test strip CHECK BLOOD SUGAR ONCE A DAY    allopurinol (ZYLOPRIM) 100 MG tablet Take 100 mg by mouth daily.    amLODipine (NORVASC) 2.5 MG tablet Take 2.5 mg by mouth daily.    carvedilol (COREG) 25 MG tablet Take 25 mg by mouth 2 (two) times daily.     cholecalciferol (VITAMIN D3) 25 MCG (1000 UNIT) tablet Take 1,000 Units by mouth daily.    cinacalcet (SENSIPAR) 30 MG tablet Take 30 mg by mouth 3 (three) times a week. M  W F    ezetimibe (ZETIA) 10 MG tablet Take 10 mg by mouth daily.    ferrous sulfate 325 (65 FE) MG EC tablet Take 325 mg by mouth every evening. 06/27/2019: Taking every evening   furosemide (LASIX) 40 MG tablet Take 40 mg by mouth daily.    losartan (COZAAR) 100 MG tablet Take 100 mg by mouth daily.    oxybutynin (DITROPAN) 5 MG tablet Take 5 mg by mouth 2 (two) times daily.     rosuvastatin  (CRESTOR ) 10 MG tablet Take 10 mg by mouth daily.    No facility-administered encounter medications on file as of 07/02/2024.    ALLERGIES:  Allergies  Allergen Reactions   Amoxicillin Other (See Comments)    Pt states allergic reaction in vaginal region. Has patient had a PCN reaction causing immediate rash, facial/tongue/throat swelling, SOB or lightheadedness with hypotension: No Has patient had a PCN reaction causing severe rash involving mucus membranes or skin necrosis: No Has patient had a PCN reaction that required hospitalization: No Has patient had a PCN reaction occurring within the last 10 years: No Yeast infection reaction ONLY If all of the above answers are NO, then may proceed with Ce   Atorvastatin Other (See Comments)    MUSCLE ACHES  Other Reaction(s): Not available  Lipitor   Prednisone Other (See Comments)    ELEVATED BLOOD SUGAR   Iodinated Contrast Media Other (See Comments)    PATIENT IS UNAWARE  OF THIS ALLERGY OR ANY REACTION TO THIS MEDICATION. Other reaction(s): Other (See Comments) PATIENT IS UNAWARE OF THIS ALLERGY OR ANY REACTION TO THIS MEDICATION.    LABORATORY DATA:  I have reviewed the labs as listed.  CBC    Component Value Date/Time   WBC 5.6 07/02/2024 1338   RBC 3.63 (L) 07/02/2024 1338   HGB 9.9 (L) 07/02/2024 1338   HCT 33.3 (L) 07/02/2024 1338   PLT 149 (L) 07/02/2024 1338   MCV 91.7 07/02/2024 1338   MCH 27.3 07/02/2024 1338   MCHC 29.7 (L) 07/02/2024 1338   RDW 13.9 07/02/2024 1338   LYMPHSABS 1.3 06/04/2024 1042   MONOABS 0.5 06/04/2024 1042   EOSABS 0.1 06/04/2024 1042   BASOSABS 0.0 06/04/2024 1042      Latest Ref Rng & Units 07/02/2024    1:38 PM 06/04/2024   10:42 AM 05/09/2024    9:49 AM  CMP  Glucose 70 - 99 mg/dL 98  892  868   BUN 8 - 23 mg/dL 67  72  65   Creatinine 0.44 - 1.00 mg/dL 7.13  6.61  7.18   Sodium 135 - 145 mmol/L 140  140  138   Potassium 3.5 - 5.1 mmol/L 4.6  3.7  3.8   Chloride 98 - 111 mmol/L 102  100  100   CO2 22 - 32 mmol/L 26  29  30    Calcium  8.9 - 10.3 mg/dL 9.4  9.3  9.5   Total Protein 6.5 - 8.1 g/dL  7.5    Total Bilirubin 0.0 - 1.2 mg/dL  0.9    Alkaline Phos 38 - 126 U/L  84    AST 15 - 41 U/L  48    ALT 0 - 44 U/L  75      DIAGNOSTIC IMAGING:  I have independently reviewed the relevant imaging and discussed with the patient.   WRAP UP:  All questions were answered. The patient knows to call the clinic with any problems, questions or concerns.  Medical decision making: Moderate  Time spent on visit: I spent 20 minutes counseling the patient face to face. The total time spent in the appointment was 30 minutes and more than 50% was on counseling.  Pleasant CHRISTELLA Barefoot, PA-C  07/02/24 4:00 PM

## 2024-07-02 ENCOUNTER — Inpatient Hospital Stay

## 2024-07-02 ENCOUNTER — Inpatient Hospital Stay: Attending: Physician Assistant | Admitting: Physician Assistant

## 2024-07-02 DIAGNOSIS — D649 Anemia, unspecified: Secondary | ICD-10-CM | POA: Diagnosis not present

## 2024-07-02 DIAGNOSIS — N184 Chronic kidney disease, stage 4 (severe): Secondary | ICD-10-CM | POA: Diagnosis not present

## 2024-07-02 DIAGNOSIS — D631 Anemia in chronic kidney disease: Secondary | ICD-10-CM | POA: Insufficient documentation

## 2024-07-02 DIAGNOSIS — I120 Hypertensive chronic kidney disease with stage 5 chronic kidney disease or end stage renal disease: Secondary | ICD-10-CM | POA: Diagnosis present

## 2024-07-02 DIAGNOSIS — N185 Chronic kidney disease, stage 5: Secondary | ICD-10-CM | POA: Diagnosis present

## 2024-07-02 DIAGNOSIS — E1122 Type 2 diabetes mellitus with diabetic chronic kidney disease: Secondary | ICD-10-CM | POA: Insufficient documentation

## 2024-07-02 LAB — BASIC METABOLIC PANEL WITH GFR
Anion gap: 12 (ref 5–15)
BUN: 67 mg/dL — ABNORMAL HIGH (ref 8–23)
CO2: 26 mmol/L (ref 22–32)
Calcium: 9.4 mg/dL (ref 8.9–10.3)
Chloride: 102 mmol/L (ref 98–111)
Creatinine, Ser: 2.86 mg/dL — ABNORMAL HIGH (ref 0.44–1.00)
GFR, Estimated: 17 mL/min — ABNORMAL LOW (ref >60)
Glucose, Bld: 98 mg/dL (ref 70–99)
Potassium: 4.6 mmol/L (ref 3.5–5.1)
Sodium: 140 mmol/L (ref 135–145)

## 2024-07-02 LAB — CBC
HCT: 33.3 % — ABNORMAL LOW (ref 36.0–46.0)
Hemoglobin: 9.9 g/dL — ABNORMAL LOW (ref 12.0–15.0)
MCH: 27.3 pg (ref 26.0–34.0)
MCHC: 29.7 g/dL — ABNORMAL LOW (ref 30.0–36.0)
MCV: 91.7 fL (ref 80.0–100.0)
Platelets: 149 K/uL — ABNORMAL LOW (ref 150–400)
RBC: 3.63 MIL/uL — ABNORMAL LOW (ref 3.87–5.11)
RDW: 13.9 % (ref 11.5–15.5)
WBC: 5.6 K/uL (ref 4.0–10.5)
nRBC: 0 % (ref 0.0–0.2)

## 2024-07-02 MED ORDER — DARBEPOETIN ALFA 60 MCG/0.3ML IJ SOSY
60.0000 ug | PREFILLED_SYRINGE | Freq: Once | INTRAMUSCULAR | Status: AC
  Administered 2024-07-02: 60 ug via SUBCUTANEOUS
  Filled 2024-07-02: qty 0.3

## 2024-07-02 NOTE — Progress Notes (Signed)
Patient presents today for Aranesp injection. Hemoglobin reviewed prior to administration. VSS. Injection tolerated without incident or complaint. Patient stable during and after injection. See MAR for details. Patient discharged in satisfactory condition with no s/s of distress noted. 

## 2024-07-02 NOTE — Patient Instructions (Signed)
 [facility] [place] - A DEPT OF [facility]  Discharge Instructions: Thank you for choosing [facility] to provide your oncology and hematology care.  If you have a lab appointment with the Cancer Center - please note that after April 8th, 2024, all labs will be drawn in the cancer center.  You do not have to check in or register with the main entrance as you have in the past but will complete your check-in in the cancer center.  Wear comfortable clothing and clothing appropriate for easy access to any Portacath or PICC line.   We strive to give you quality time with your provider. You may need to reschedule your appointment if you arrive late (15 or more minutes).  Arriving late affects you and other patients whose appointments are after yours.  Also, if you miss three or more appointments without notifying the office, you may be dismissed from the clinic at the provider's discretion.      For prescription refill requests, have your pharmacy contact our office and allow 72 hours for refills to be completed.    Today you received the following Aranesp, return as scheduled.   To help prevent nausea and vomiting after your treatment, we encourage you to take your nausea medication as directed.  BELOW ARE SYMPTOMS THAT SHOULD BE REPORTED IMMEDIATELY: *FEVER GREATER THAN 100.4 F (38 C) OR HIGHER *CHILLS OR SWEATING *NAUSEA AND VOMITING THAT IS NOT CONTROLLED WITH YOUR NAUSEA MEDICATION *UNUSUAL SHORTNESS OF BREATH *UNUSUAL BRUISING OR BLEEDING *URINARY PROBLEMS (pain or burning when urinating, or frequent urination) *BOWEL PROBLEMS (unusual diarrhea, constipation, pain near the anus) TENDERNESS IN MOUTH AND THROAT WITH OR WITHOUT PRESENCE OF ULCERS (sore throat, sores in mouth, or a toothache) UNUSUAL RASH, SWELLING OR PAIN  UNUSUAL VAGINAL DISCHARGE OR ITCHING   Items with * indicate a potential emergency and should be followed up as soon as possible or  go to the Emergency Department if any problems should occur.  Please show the CHEMOTHERAPY ALERT CARD or IMMUNOTHERAPY ALERT CARD at check-in to the Emergency Department and triage nurse.  Should you have questions after your visit or need to cancel or reschedule your appointment, please contact [facility] [place] - A DEPT OF [facility] [phone]  and follow the prompts.  Office hours are 8:00 a.m. to 4:30 p.m. Monday - Friday. Please note that voicemails left after 4:00 p.m. may not be returned until the following business day.  We are closed weekends and major holidays. You have access to a nurse at all times for urgent questions. Please call the main number to the clinic [phone] and follow the prompts.  For any non-urgent questions, you may also contact your provider using MyChart. We now offer e-Visits for anyone 20 and older to request care online for non-urgent symptoms. For details visit [email or web address].   Also download the MyChart app! Go to the app store, search "MyChart", open the app, select [place], and log in with your MyChart username and password.

## 2024-07-02 NOTE — Patient Instructions (Signed)
 [facility] at [facility] **VISIT SUMMARY & IMPORTANT INSTRUCTIONS **   You were seen today by Pleasant Barefoot PA-C for your follow-up visit.    ANEMIA FROM CHRONIC KIDNEY DISEASE Chronic kidney disease makes it difficult for your body to make new blood cells. Your blood levels are lower than they need to be. We will continue your Aranesp  injections every 4 weeks.   Continue taking iron supplement every other day.  FOLLOW-UP APPOINTMENT: 3 months  ** Thank you for trusting me with your healthcare!  I strive to provide all of my patients with quality care at each visit.  If you receive a survey for this visit, I would be so grateful to you for taking the time to provide feedback.  Thank you in advance!  ~ Latrel Szymczak                   Dr. Alean Stands   &   Pleasant Barefoot, PA-C   - - - - - - - - - - - - - - - - - -    Thank you for choosing [facility] at [facility] to provide your oncology and hematology care.  To afford each patient quality time with our provider, please arrive at least 15 minutes before your scheduled appointment time.   If you have a lab appointment with the Cancer Center please come in thru the Main Entrance and check in at the main information desk.  You need to re-schedule your appointment should you arrive 10 or more minutes late.  We strive to give you quality time with our providers, and arriving late affects you and other patients whose appointments are after yours.  Also, if you no show three or more times for appointments you may be dismissed from the clinic at the providers discretion.     Again, thank you for choosing [facility].  Our hope is that these requests will decrease the amount of time that you wait before being seen by our physicians.       _____________________________________________________________  Should you have questions after your visit to [facility], please  contact our office at [phone] and follow the prompts.  Our office hours are 8:00 a.m. and 4:30 p.m. Monday - Friday.  Please note that voicemails left after 4:00 p.m. may not be returned until the following business day.  We are closed weekends and major holidays.  You do have access to a nurse 24-7, just call the main number to the clinic [phone] and do not press any options, hold on the line and a nurse will answer the phone.    For prescription refill requests, have your pharmacy contact our office and allow 72 hours.

## 2024-07-03 ENCOUNTER — Ambulatory Visit (INDEPENDENT_AMBULATORY_CARE_PROVIDER_SITE_OTHER): Payer: Medicare Other

## 2024-07-03 ENCOUNTER — Encounter: Payer: Self-pay | Admitting: Physician Assistant

## 2024-07-03 ENCOUNTER — Ambulatory Visit (INDEPENDENT_AMBULATORY_CARE_PROVIDER_SITE_OTHER): Payer: Medicare Other | Admitting: Nurse Practitioner

## 2024-07-03 ENCOUNTER — Encounter (INDEPENDENT_AMBULATORY_CARE_PROVIDER_SITE_OTHER): Payer: Self-pay | Admitting: Nurse Practitioner

## 2024-07-03 VITALS — BP 158/75 | HR 64 | Resp 16 | Ht 63.0 in | Wt 145.8 lb

## 2024-07-03 DIAGNOSIS — E1121 Type 2 diabetes mellitus with diabetic nephropathy: Secondary | ICD-10-CM

## 2024-07-03 DIAGNOSIS — N185 Chronic kidney disease, stage 5: Secondary | ICD-10-CM

## 2024-07-03 DIAGNOSIS — I10 Essential (primary) hypertension: Secondary | ICD-10-CM | POA: Diagnosis not present

## 2024-07-03 NOTE — Progress Notes (Signed)
 Subjective:    Patient ID: Tara Holmes, female    DOB: [birth year], 78 y.o.   MRN: [id number] Chief Complaint  Patient presents with   Follow-up    1 year follow up HDA    The patient currently does not use access as she has not yet begun dialysis.   The patient returns to the office for followup of their dialysis access.  She was previously seen in the office July 03, 2023.  The function of the access has been stable.    The patient denies redness or swelling at the access site. The patient denies fever or chills at home. The patient has a flow volume of 250.  There is no focal velocity increase or internal stenosis noted.  The radial and ulnar arteries have antegrade biphasic flow noted.   Duplex ultrasound of the AV access shows a patent access with uniform velocities.  No focal hemodynamically significant stenosis.   Flow volume today is 250 cc/min (previous flow volume was 538 cc/min)   Labs 07/02/2024: BUN/Cr  67/2.86  GFR=17     Review of Systems  Cardiovascular:  Negative for leg swelling.  All other systems reviewed and are negative.      Objective:   Physical Exam Vitals reviewed.  HENT:     Head: Normocephalic.  Cardiovascular:     Rate and Rhythm: Normal rate.     Pulses:          Radial pulses are 2+ on the left side.     Arteriovenous access: Left arteriovenous access is present.     Comments: Soft thrill and bruit consistent with wavelinQ  Pulmonary:     Effort: Pulmonary effort is normal.  Skin:    General: Skin is warm and dry.  Neurological:     Mental Status: She is alert and oriented to person, place, and time.  Psychiatric:        Mood and Affect: Mood normal.        Behavior: Behavior normal.        Thought Content: Thought content normal.        Judgment: Judgment normal.     BP (!) 158/75 (BP Location: Right Arm, Patient Position: Sitting, Cuff Size: Normal)   Pulse 64   Resp 16   Ht 5' 3 (1.6 m)   Wt 145 lb 12.8 oz  (66.1 kg)   BMI 25.83 kg/m   Past Medical History:  Diagnosis Date   Anemia    Aneurysm (HCC) 1978   Arthritis    Chronic kidney disease    ESRD   Diabetes (HCC)    Encephalomalacia    GERD (gastroesophageal reflux disease)    Gout    Head injury 10/20/2020   d/t fall   Heart disease    Hypertension    Mild memory disturbance    Seizures (HCC)    Sleep apnea     Social History   Socioeconomic History   Marital status: Married    Spouse name: [place]   Number of children: 2   Years of education: MA   Highest education level: Not on file  Occupational History   Occupation: Retired  Tobacco Use   Smoking status: Never   Smokeless tobacco: Never  Vaping Use   Vaping status: Never Used  Substance and Sexual Activity   Alcohol use: No   Drug use: Never   Sexual activity: Not on file  Other Topics Concern   Not on file  Social History Narrative   Patient lives at home with spouse.   Caffeine Use: none   Social Drivers of Corporate investment banker Strain: Not on file  Food Insecurity: Not on file  Transportation Needs: Not on file  Physical Activity: Not on file  Stress: Not on file  Social Connections: Not on file  Intimate Partner Violence: Not on file    Past Surgical History:  Procedure Laterality Date   AV FISTULA INSERTION W/ RF MAGNETIC GUIDANCE Left 08/08/2018   Procedure: AV FISTULA INSERTION W/RF MAGNETIC GUIDANCE;  Surgeon: Jama Cordella MATSU, MD;  Location: [facility] INVASIVE CV LAB;  Service: Cardiovascular;  Laterality: Left;   BREAST SURGERY Left    fibroid   TONSILLECTOMY      Family History  Problem Relation Age of Onset   Heart attack Mother    Diabetes Mother    Colon cancer Father     Allergies  Allergen Reactions   Amoxicillin Other (See Comments)    Pt states allergic reaction in vaginal region. Has patient had a PCN reaction causing immediate rash, facial/tongue/throat swelling, SOB or lightheadedness with hypotension: No Has  patient had a PCN reaction causing severe rash involving mucus membranes or skin necrosis: No Has patient had a PCN reaction that required hospitalization: No Has patient had a PCN reaction occurring within the last 10 years: No Yeast infection reaction ONLY If all of the above answers are NO, then may proceed with Ce   Atorvastatin Other (See Comments)    MUSCLE ACHES  Other Reaction(s): Not available  Lipitor   Prednisone Other (See Comments)    ELEVATED BLOOD SUGAR   Iodinated Contrast Media Other (See Comments)    PATIENT IS UNAWARE OF THIS ALLERGY OR ANY REACTION TO THIS MEDICATION. Other reaction(s): Other (See Comments) PATIENT IS UNAWARE OF THIS ALLERGY OR ANY REACTION TO THIS MEDICATION.       Latest Ref Rng & Units 07/02/2024    1:38 PM 06/04/2024   10:42 AM 05/21/2024    8:34 AM  CBC  WBC 4.0 - 10.5 K/uL 5.6  5.0  5.3   Hemoglobin 12.0 - 15.0 g/dL 9.9  89.6  89.8   Hematocrit 36.0 - 46.0 % 33.3  34.3  32.9   Platelets 150 - 400 K/uL 149  164  177       CMP     Component Value Date/Time   NA 140 07/02/2024 1338   K 4.6 07/02/2024 1338   CL 102 07/02/2024 1338   CO2 26 07/02/2024 1338   GLUCOSE 98 07/02/2024 1338   BUN 67 (H) 07/02/2024 1338   CREATININE 2.86 (H) 07/02/2024 1338   CALCIUM  9.4 07/02/2024 1338   CALCIUM  9.5 03/12/2024 0903   PROT 7.5 06/04/2024 1042   ALBUMIN 3.6 06/04/2024 1042   AST 48 (H) 06/04/2024 1042   ALT 75 (H) 06/04/2024 1042   ALKPHOS 84 06/04/2024 1042   BILITOT 0.9 06/04/2024 1042   GFRNONAA 17 (L) 07/02/2024 1338     No results found.     Assessment & Plan:   1. Chronic kidney disease, stage V (HCC) (Primary) Patient currently still has not started dialysis.  While her access does show decreased flow volume we will not proceed with intervention at this time.  That is because the patient is not on dialysis and any intervention will likely cause some kidney injury with possible worsening into dialysis.  Otherwise patient's  fistula is functioning fine.  We will continue  to have the patient follow-up in 1 year.  However the patient is notified that she will be on dialysis she will need to follow-up sooner   2. Primary hypertension Continue antihypertensive medications as already ordered, these medications have been reviewed and there are no changes at this time.  3. Type 2 diabetes mellitus with diabetic nephropathy, without long-term current use of insulin (HCC) Continue hypoglycemic medications as already ordered, these medications have been reviewed and there are no changes at this time.  Hgb A1C to be monitored as already arranged by primary service   Current Outpatient Medications on File Prior to Visit  Medication Sig Dispense Refill   ACCU-CHEK COMPACT PLUS test strip CHECK BLOOD SUGAR ONCE A DAY  3   allopurinol (ZYLOPRIM) 100 MG tablet Take 100 mg by mouth daily.     amLODipine (NORVASC) 2.5 MG tablet Take 2.5 mg by mouth daily.     carvedilol (COREG) 25 MG tablet Take 25 mg by mouth 2 (two) times daily.      cholecalciferol (VITAMIN D3) 25 MCG (1000 UNIT) tablet Take 1,000 Units by mouth daily.     cinacalcet (SENSIPAR) 30 MG tablet Take 30 mg by mouth 3 (three) times a week. M W F     ezetimibe (ZETIA) 10 MG tablet Take 10 mg by mouth daily.     ferrous sulfate 325 (65 FE) MG EC tablet Take 325 mg by mouth every evening.  0   furosemide (LASIX) 40 MG tablet Take 40 mg by mouth daily.     losartan (COZAAR) 100 MG tablet Take 100 mg by mouth daily.     oxybutynin (DITROPAN) 5 MG tablet Take 5 mg by mouth 2 (two) times daily.      rosuvastatin  (CRESTOR ) 10 MG tablet Take 10 mg by mouth daily.     No current facility-administered medications on file prior to visit.    There are no Patient Instructions on file for this visit. No follow-ups on file.   Talajah Slimp E Tykia Mellone, NP

## 2024-07-30 ENCOUNTER — Inpatient Hospital Stay

## 2024-07-30 ENCOUNTER — Inpatient Hospital Stay: Attending: Physician Assistant

## 2024-07-30 VITALS — BP 124/58 | HR 61 | Temp 96.9°F | Resp 18 | Wt 144.4 lb

## 2024-07-30 DIAGNOSIS — D631 Anemia in chronic kidney disease: Secondary | ICD-10-CM | POA: Diagnosis not present

## 2024-07-30 DIAGNOSIS — I120 Hypertensive chronic kidney disease with stage 5 chronic kidney disease or end stage renal disease: Secondary | ICD-10-CM | POA: Diagnosis present

## 2024-07-30 DIAGNOSIS — E1122 Type 2 diabetes mellitus with diabetic chronic kidney disease: Secondary | ICD-10-CM | POA: Diagnosis present

## 2024-07-30 DIAGNOSIS — N185 Chronic kidney disease, stage 5: Secondary | ICD-10-CM | POA: Insufficient documentation

## 2024-07-30 DIAGNOSIS — D649 Anemia, unspecified: Secondary | ICD-10-CM

## 2024-07-30 DIAGNOSIS — N184 Chronic kidney disease, stage 4 (severe): Secondary | ICD-10-CM

## 2024-07-30 LAB — CBC
HCT: 35.2 % — ABNORMAL LOW (ref 36.0–46.0)
Hemoglobin: 10.8 g/dL — ABNORMAL LOW (ref 12.0–15.0)
MCH: 28.3 pg (ref 26.0–34.0)
MCHC: 30.7 g/dL (ref 30.0–36.0)
MCV: 92.1 fL (ref 80.0–100.0)
Platelets: 175 K/uL (ref 150–400)
RBC: 3.82 MIL/uL — ABNORMAL LOW (ref 3.87–5.11)
RDW: 14 % (ref 11.5–15.5)
WBC: 6.1 K/uL (ref 4.0–10.5)
nRBC: 0 % (ref 0.0–0.2)

## 2024-07-30 MED ORDER — DARBEPOETIN ALFA 60 MCG/0.3ML IJ SOSY
60.0000 ug | PREFILLED_SYRINGE | Freq: Once | INTRAMUSCULAR | Status: AC
  Administered 2024-07-30 (×2): 60 ug via SUBCUTANEOUS
  Filled 2024-07-30: qty 0.3

## 2024-07-30 NOTE — Patient Instructions (Signed)

## 2024-07-30 NOTE — Progress Notes (Signed)
 Patient's Hgb 10.8 and blood pressure stable. Patient tolerated Aranesp  injection with no complaints voiced.  Site clean and dry with no bruising or swelling noted at site.  See MAR for details.  Band aid applied.  Patient stable during and after injection.  Vss with discharge and left in satisfactory condition with no s/s of distress noted. All follow ups as scheduled.   Tara Holmes

## 2024-08-08 ENCOUNTER — Other Ambulatory Visit (HOSPITAL_COMMUNITY)
Admission: RE | Admit: 2024-08-08 | Discharge: 2024-08-08 | Disposition: A | Discharge status: Home / Self Care | Source: Ambulatory Visit | Attending: Nephrology | Admitting: Nephrology

## 2024-08-08 DIAGNOSIS — N189 Chronic kidney disease, unspecified: Secondary | ICD-10-CM | POA: Diagnosis present

## 2024-08-08 LAB — CBC
HCT: 36.5 % (ref 36.0–46.0)
Hemoglobin: 11.1 g/dL — ABNORMAL LOW (ref 12.0–15.0)
MCH: 28.5 pg (ref 26.0–34.0)
MCHC: 30.4 g/dL (ref 30.0–36.0)
MCV: 93.8 fL (ref 80.0–100.0)
Platelets: 191 K/uL (ref 150–400)
RBC: 3.89 MIL/uL (ref 3.87–5.11)
RDW: 14.6 % (ref 11.5–15.5)
WBC: 5.6 K/uL (ref 4.0–10.5)
nRBC: 0 % (ref 0.0–0.2)

## 2024-08-08 LAB — RENAL FUNCTION PANEL
Albumin: 3.8 g/dL (ref 3.5–5.0)
Anion gap: 14 (ref 5–15)
BUN: 67 mg/dL — ABNORMAL HIGH (ref 8–23)
CO2: 27 mmol/L (ref 22–32)
Calcium: 9.4 mg/dL (ref 8.9–10.3)
Chloride: 99 mmol/L (ref 98–111)
Creatinine, Ser: 3.65 mg/dL — ABNORMAL HIGH (ref 0.44–1.00)
GFR, Estimated: 12 mL/min — ABNORMAL LOW (ref >60)
Glucose, Bld: 120 mg/dL — ABNORMAL HIGH (ref 70–99)
Phosphorus: 4.3 mg/dL (ref 2.5–4.6)
Potassium: 4.1 mmol/L (ref 3.5–5.1)
Sodium: 140 mmol/L (ref 135–145)

## 2024-08-08 LAB — PROTEIN / CREATININE RATIO, URINE
Creatinine, Urine: 46 mg/dL
Protein Creatinine Ratio: 0.33 mg/mg{creat} — ABNORMAL HIGH (ref 0.00–0.15)
Total Protein, Urine: 15 mg/dL

## 2024-08-09 LAB — PARATHYROID HORMONE, INTACT (NO CA): PTH: 119 pg/mL — ABNORMAL HIGH (ref 15–65)

## 2024-08-27 ENCOUNTER — Inpatient Hospital Stay: Attending: Physician Assistant

## 2024-08-27 ENCOUNTER — Inpatient Hospital Stay

## 2024-08-27 VITALS — BP 142/70 | HR 74 | Temp 97.3°F | Resp 16

## 2024-08-27 DIAGNOSIS — E1122 Type 2 diabetes mellitus with diabetic chronic kidney disease: Secondary | ICD-10-CM | POA: Insufficient documentation

## 2024-08-27 DIAGNOSIS — I120 Hypertensive chronic kidney disease with stage 5 chronic kidney disease or end stage renal disease: Secondary | ICD-10-CM | POA: Diagnosis present

## 2024-08-27 DIAGNOSIS — D631 Anemia in chronic kidney disease: Secondary | ICD-10-CM | POA: Diagnosis not present

## 2024-08-27 DIAGNOSIS — N185 Chronic kidney disease, stage 5: Secondary | ICD-10-CM | POA: Insufficient documentation

## 2024-08-27 DIAGNOSIS — D649 Anemia, unspecified: Secondary | ICD-10-CM

## 2024-08-27 LAB — CBC
HCT: 34.3 % — ABNORMAL LOW (ref 36.0–46.0)
Hemoglobin: 10.8 g/dL — ABNORMAL LOW (ref 12.0–15.0)
MCH: 29 pg (ref 26.0–34.0)
MCHC: 31.5 g/dL (ref 30.0–36.0)
MCV: 92.2 fL (ref 80.0–100.0)
Platelets: 165 K/uL (ref 150–400)
RBC: 3.72 MIL/uL — ABNORMAL LOW (ref 3.87–5.11)
RDW: 14 % (ref 11.5–15.5)
WBC: 5.7 K/uL (ref 4.0–10.5)
nRBC: 0 % (ref 0.0–0.2)

## 2024-08-27 MED ORDER — DARBEPOETIN ALFA 60 MCG/0.3ML IJ SOSY
60.0000 ug | PREFILLED_SYRINGE | Freq: Once | INTRAMUSCULAR | Status: AC
  Administered 2024-08-27: 60 ug via SUBCUTANEOUS
  Filled 2024-08-27: qty 0.3

## 2024-08-27 NOTE — Patient Instructions (Addendum)
 [facility] - A DEPT OF [facility]  Discharge Instructions: Thank you for choosing [facility] to provide your oncology and hematology care.  If you have a lab appointment with the Cancer Center - please note that after April 8th, 2024, all labs will be drawn in the cancer center.  You do not have to check in or register with the main entrance as you have in the past but will complete your check-in in the cancer center.  Wear comfortable clothing and clothing appropriate for easy access to any Portacath or PICC line.   We strive to give you quality time with your provider. You may need to reschedule your appointment if you arrive late (15 or more minutes).  Arriving late affects you and other patients whose appointments are after yours.  Also, if you miss three or more appointments without notifying the office, you may be dismissed from the clinic at the provider's discretion.      For prescription refill requests, have your pharmacy contact our office and allow 72 hours for refills to be completed.    Today you received Aranesp  60 mcg injection        BELOW ARE SYMPTOMS THAT SHOULD BE REPORTED IMMEDIATELY: *FEVER GREATER THAN 100.4 F (38 C) OR HIGHER *CHILLS OR SWEATING *NAUSEA AND VOMITING THAT IS NOT CONTROLLED WITH YOUR NAUSEA MEDICATION *UNUSUAL SHORTNESS OF BREATH *UNUSUAL BRUISING OR BLEEDING *URINARY PROBLEMS (pain or burning when urinating, or frequent urination) *BOWEL PROBLEMS (unusual diarrhea, constipation, pain near the anus) TENDERNESS IN MOUTH AND THROAT WITH OR WITHOUT PRESENCE OF ULCERS (sore throat, sores in mouth, or a toothache) UNUSUAL RASH, SWELLING OR PAIN  UNUSUAL VAGINAL DISCHARGE OR ITCHING   Items with * indicate a potential emergency and should be followed up as soon as possible or go to the Emergency Department if any problems should occur.  Please show the CHEMOTHERAPY ALERT CARD or IMMUNOTHERAPY ALERT CARD at  check-in to the Emergency Department and triage nurse.  Should you have questions after your visit or need to cancel or reschedule your appointment, please contact [facility] [place] - A DEPT OF [facility] [phone]  and follow the prompts.  Office hours are 8:00 a.m. to 4:30 p.m. Monday - Friday. Please note that voicemails left after 4:00 p.m. may not be returned until the following business day.  We are closed weekends and major holidays. You have access to a nurse at all times for urgent questions. Please call the main number to the clinic [phone] and follow the prompts.  For any non-urgent questions, you may also contact your provider using MyChart. We now offer e-Visits for anyone 34 and older to request care online for non-urgent symptoms. For details visit [email or web address].   Also download the MyChart app! Go to the app store, search MyChart, open the app, select [facility], and log in with your MyChart username and password.

## 2024-08-27 NOTE — Progress Notes (Signed)
 Tara Holmes presents today for injection per the provider's orders.  Aranesp  60 mcg administration without incident; injection site WNL; see MAR for injection details.  Patient tolerated procedure well and without incident.  No questions or complaints noted at this time. Patient's hemoglobin noted to be 10.8 today.  Discharged from clinic ambulatory in stable condition. Alert and oriented x 3. F/U with [facility] as scheduled.

## 2024-08-31 ENCOUNTER — Emergency Department (HOSPITAL_COMMUNITY)
Admission: EM | Admit: 2024-08-31 | Discharge: 2024-08-31 | Disposition: A | Discharge status: Home / Self Care | Attending: Emergency Medicine | Admitting: Emergency Medicine

## 2024-08-31 ENCOUNTER — Emergency Department (HOSPITAL_COMMUNITY)

## 2024-08-31 ENCOUNTER — Other Ambulatory Visit: Payer: Self-pay

## 2024-08-31 DIAGNOSIS — Z79899 Other long term (current) drug therapy: Secondary | ICD-10-CM | POA: Diagnosis not present

## 2024-08-31 DIAGNOSIS — M79602 Pain in left arm: Secondary | ICD-10-CM | POA: Diagnosis present

## 2024-08-31 MED ORDER — HYDROCODONE-ACETAMINOPHEN 5-325 MG PO TABS: ORAL_TABLET | ORAL | 0 refills | Status: DC

## 2024-08-31 MED ORDER — HYDROCODONE-ACETAMINOPHEN 5-325 MG PO TABS
1.0000 | ORAL_TABLET | Freq: Once | ORAL | Status: AC
  Administered 2024-08-31: 1 via ORAL
  Filled 2024-08-31: qty 1

## 2024-08-31 NOTE — ED Provider Notes (Signed)
 [place] EMERGENCY DEPARTMENT AT [facility] Acute Care Provider Note   CSN: [id number] Arrival date & time: 08/31/24  8161     Patient presents with: Arm Injury   [facility] Tara Holmes is a 78 y.o. female.   Patient complains of pain in the left arm.  She has an AV fistula in that arm for the last 4 years but has never been used.  The history is provided by the patient and medical records. No language interpreter was used.  Arm Injury Location:  Arm Pain details:    Quality:  Aching   Radiates to:  Does not radiate   Severity:  Mild   Onset quality:  Sudden   Timing:  Constant   Progression:  Worsening Dislocation: no   Associated symptoms: no back pain and no fatigue        Prior to Admission medications   Medication Sig Start Date End Date Taking? Authorizing Provider  HYDROcodone -acetaminophen  (NORCO/VICODIN) 5-325 MG tablet Take 1 every 6 hours for pain and that is not relieved by Tylenol  alone 08/31/24  Yes Tamirra Sienkiewicz, MD  ACCU-CHEK COMPACT PLUS test strip CHECK BLOOD SUGAR ONCE A DAY 05/07/18   [provider]  allopurinol (ZYLOPRIM) 100 MG tablet Take 100 mg by mouth daily.    [provider]  amLODipine (NORVASC) 2.5 MG tablet Take 2.5 mg by mouth daily.    [provider]  carvedilol (COREG) 25 MG tablet Take 25 mg by mouth 2 (two) times daily.  03/11/14   [provider]  cholecalciferol (VITAMIN D3) 25 MCG (1000 UNIT) tablet Take 1,000 Units by mouth daily. 08/11/22   [provider]  cinacalcet (SENSIPAR) 30 MG tablet Take 30 mg by mouth 3 (three) times a week. M W F    [provider]  ezetimibe (ZETIA) 10 MG tablet Take 10 mg by mouth daily.    [provider]  ferrous sulfate 325 (65 FE) MG EC tablet Take 325 mg by mouth every evening. 06/23/17   [provider]  furosemide (LASIX) 40 MG tablet Take 40 mg by mouth daily. 01/25/24   [provider]  losartan (COZAAR)  100 MG tablet Take 100 mg by mouth daily.    [provider]  oxybutynin (DITROPAN) 5 MG tablet Take 5 mg by mouth 2 (two) times daily.     [provider]  rosuvastatin  (CRESTOR ) 10 MG tablet Take 10 mg by mouth daily.    [provider]    Allergies: Amoxicillin, Atorvastatin, Prednisone, and Iodinated contrast media    Review of Systems  Constitutional:  Negative for appetite change and fatigue.  HENT:  Negative for congestion, ear discharge and sinus pressure.   Eyes:  Negative for discharge.  Respiratory:  Negative for cough.   Cardiovascular:  Negative for chest pain.  Gastrointestinal:  Negative for abdominal pain and diarrhea.  Genitourinary:  Negative for frequency and hematuria.  Musculoskeletal:  Negative for back pain.       Left arm pain  Skin:  Negative for rash.  Neurological:  Negative for seizures and headaches.  Psychiatric/Behavioral:  Negative for hallucinations.     Updated Vital Signs BP (!) 157/58 (BP Location: Right Arm)   Pulse (!) 57   Temp 97.7 F (36.5 C)   Resp 17   SpO2 100%   Physical Exam Vitals and nursing note reviewed.  Constitutional:      Appearance: She is well-developed.  HENT:  Head: Normocephalic.     Nose: Nose normal.  Eyes:     General: No scleral icterus.    Conjunctiva/sclera: Conjunctivae normal.  Neck:     Thyroid: No thyromegaly.  Cardiovascular:     Rate and Rhythm: Normal rate and regular rhythm.     Heart sounds: No murmur heard.    No friction rub. No gallop.  Pulmonary:     Breath sounds: No stridor. No wheezing or rales.  Chest:     Chest wall: No tenderness.  Abdominal:     General: There is no distension.     Tenderness: There is no abdominal tenderness. There is no rebound.  Musculoskeletal:        General: Normal range of motion.     Cervical back: Neck supple.     Comments: Mild tenderness to left forearm.  Neurovascular exam normal in the left arm.  Patient has an AV  fistula without any thrill.  Patient also with tenderness of the left shoulder  Lymphadenopathy:     Cervical: No cervical adenopathy.  Skin:    Findings: No erythema or rash.  Neurological:     Mental Status: She is alert and oriented to person, place, and time.     Motor: No abnormal muscle tone.     Coordination: Coordination normal.  Psychiatric:        Behavior: Behavior normal.     (all labs ordered are listed, but only abnormal results are displayed) Labs Reviewed - No data to display  EKG: None  Radiology: DG Forearm Left Result Date: 08/31/2024 CLINICAL DATA:  pain EXAM: LEFT FOREARM - 2 VIEW; LEFT HUMERUS - 2+ VIEW COMPARISON:  None Available. FINDINGS: There is no evidence of fracture or other focal bone lesions. Shoulder, elbow, wrist grossly unremarkable for traumatic injury. Carpometacarpal joint degenerative changes. Glenohumeral joint degenerative changes. Soft tissues are unremarkable. Likely vascular correlating noted along the distal arm. IMPRESSION: No acute displaced fracture or dislocation of the bones of the left forearm and humerus. Electronically Signed   By: Morgane  Naveau M.D.   On: 08/31/2024 20:41   DG Humerus Left Result Date: 08/31/2024 CLINICAL DATA:  pain EXAM: LEFT FOREARM - 2 VIEW; LEFT HUMERUS - 2+ VIEW COMPARISON:  None Available. FINDINGS: There is no evidence of fracture or other focal bone lesions. Shoulder, elbow, wrist grossly unremarkable for traumatic injury. Carpometacarpal joint degenerative changes. Glenohumeral joint degenerative changes. Soft tissues are unremarkable. Likely vascular correlating noted along the distal arm. IMPRESSION: No acute displaced fracture or dislocation of the bones of the left forearm and humerus. Electronically Signed   By: Morgane  Naveau M.D.   On: 08/31/2024 20:41     Procedures   Medications Ordered in the ED  HYDROcodone -acetaminophen  (NORCO/VICODIN) 5-325 MG per tablet 1 tablet (has no administration in  time range)                                    Medical Decision Making Amount and/or Complexity of Data Reviewed Radiology: ordered.  Risk Prescription drug management.   Left arm pain with known AV fistula that has not been used and is not working.  Suspect musculoskeletal discomfort.  Patient will return tomorrow for an ultrasound of her left upper arm     Final diagnoses:  Left arm pain    ED Discharge Orders          Ordered  US  Venous Img Upper Uni Left        08/31/24 2113    HYDROcodone -acetaminophen  (NORCO/VICODIN) 5-325 MG tablet        08/31/24 2114               Suzette Pac, MD 09/02/24 1216

## 2024-08-31 NOTE — ED Triage Notes (Signed)
 Pt has fistula in her left arm that has never been used and is now closing up but pt stated that she has never had problems with it until this morning when she began having severe pain. No swelling noted

## 2024-08-31 NOTE — Discharge Instructions (Signed)
 Follow-up tomorrow morning for an ultrasound  Your ultrasound did show a blood clot in your left arm in your ulnar vein.  Please let your kidney doctor know and follow-up with your PCP.  You should also follow-up with the DVT clinic.  You can have your PCP refer you or call [phone] to request appointment.  Come back to the ER if you have new or worsening symptoms, since you are on blood thinners you are at increased risks of severe and potentially life-threatening bleeding.  While this is not, it is possible.  Please be careful, do not do activities that could risk fall or significant injury.  If you do happen to hit your head or have other injuries important for you to seek evaluation in the ER.

## 2024-09-01 ENCOUNTER — Other Ambulatory Visit (HOSPITAL_COMMUNITY): Payer: Self-pay | Admitting: Emergency Medicine

## 2024-09-01 ENCOUNTER — Ambulatory Visit (HOSPITAL_COMMUNITY)
Admission: RE | Admit: 2024-09-01 | Discharge: 2024-09-01 | Disposition: A | Discharge status: Home / Self Care | Source: Ambulatory Visit | Attending: Emergency Medicine | Admitting: Emergency Medicine

## 2024-09-01 ENCOUNTER — Telehealth (HOSPITAL_COMMUNITY): Payer: Self-pay | Admitting: Physician Assistant

## 2024-09-01 DIAGNOSIS — I82622 Acute embolism and thrombosis of deep veins of left upper extremity: Secondary | ICD-10-CM

## 2024-09-01 DIAGNOSIS — M79602 Pain in left arm: Secondary | ICD-10-CM | POA: Diagnosis present

## 2024-09-01 MED ORDER — APIXABAN (ELIQUIS) VTE STARTER PACK (10MG AND 5MG)
ORAL_TABLET | ORAL | 0 refills | Status: DC
Start: 2024-09-01 — End: 2024-10-03

## 2024-09-01 NOTE — Telephone Encounter (Cosign Needed)
 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  I consulted vascular surgery and Dr. Silver suggested anticoagulation.  Patient will be started on Eliquis .  We discussed risks of bleeding and she verbalized understanding of risks and need for return if she is severe bleeding or has any injury such as falling and striking her head.  Discussed with pharmacy who is going to bring patient a co-pay card.  Instructed follow-up with PCP and also given follow-up information for vascular clinic.  Also advised to call her nephrologist Dr. Rachele.  He has no upper arm or neck swelling, her fistula has a palpable thrill, her arm is only mildly swollen with no color change, pulses are intact.  She is not in any significant amount of pain at this time.

## 2024-09-04 ENCOUNTER — Other Ambulatory Visit (HOSPITAL_COMMUNITY)
Admission: RE | Admit: 2024-09-04 | Discharge: 2024-09-04 | Disposition: A | Discharge status: Home / Self Care | Source: Ambulatory Visit | Attending: Nephrology | Admitting: Nephrology

## 2024-09-04 DIAGNOSIS — N184 Chronic kidney disease, stage 4 (severe): Secondary | ICD-10-CM | POA: Insufficient documentation

## 2024-09-04 DIAGNOSIS — N185 Chronic kidney disease, stage 5: Secondary | ICD-10-CM | POA: Diagnosis present

## 2024-09-04 LAB — HEPATITIS B SURFACE ANTIGEN: Hepatitis B Surface Ag: NONREACTIVE

## 2024-09-04 LAB — HEPATITIS C ANTIBODY: HCV Ab: NONREACTIVE

## 2024-09-04 LAB — HEPATITIS B SURFACE ANTIBODY,QUALITATIVE: Hep B S Ab: NONREACTIVE

## 2024-09-07 LAB — QUANTIFERON-TB GOLD PLUS: QuantiFERON-TB Gold Plus: NEGATIVE

## 2024-09-07 LAB — QUANTIFERON-TB GOLD PLUS (RQFGPL)
QuantiFERON Mitogen Value: 4.25 [IU]/mL
QuantiFERON Nil Value: 0.04 [IU]/mL
QuantiFERON TB1 Ag Value: 0.04 [IU]/mL
QuantiFERON TB2 Ag Value: 0.05 [IU]/mL

## 2024-09-12 ENCOUNTER — Telehealth: Payer: Self-pay | Admitting: Nurse Practitioner

## 2024-09-12 NOTE — Telephone Encounter (Signed)
 Reports this morning after breakfast she felt an aching pain in her left breast that lasted a few seconds. Denies dizziness, chest pain or SOB. Reports she has been able to cook and clean today and she feels good. Reports she has a mammogram appointment next week. Advised to contact her PCP for left breast pain. Gave sooner appointment to see Tara Holmes on 10/03/2024 @9 :00 am. Advised if she develops worsening symptoms, to go to the ED for an evaluation. Verbalized understanding of plan. Will forward to DOD as an FYI.

## 2024-09-12 NOTE — Telephone Encounter (Signed)
 Pt is having an uncomfortable feeling in her chest. Would like to follow up in [place] with Almarie. Scheduled for our soonest appt which is December

## 2024-09-21 ENCOUNTER — Emergency Department (HOSPITAL_COMMUNITY)

## 2024-09-21 ENCOUNTER — Other Ambulatory Visit: Payer: Self-pay

## 2024-09-21 ENCOUNTER — Emergency Department (HOSPITAL_COMMUNITY)
Admission: EM | Admit: 2024-09-21 | Discharge: 2024-09-21 | Disposition: A | Discharge status: Home / Self Care | Attending: Emergency Medicine | Admitting: Emergency Medicine

## 2024-09-21 ENCOUNTER — Encounter (HOSPITAL_COMMUNITY): Payer: Self-pay

## 2024-09-21 DIAGNOSIS — Z7901 Long term (current) use of anticoagulants: Secondary | ICD-10-CM | POA: Diagnosis not present

## 2024-09-21 DIAGNOSIS — I129 Hypertensive chronic kidney disease with stage 1 through stage 4 chronic kidney disease, or unspecified chronic kidney disease: Secondary | ICD-10-CM | POA: Diagnosis not present

## 2024-09-21 DIAGNOSIS — E1122 Type 2 diabetes mellitus with diabetic chronic kidney disease: Secondary | ICD-10-CM | POA: Insufficient documentation

## 2024-09-21 DIAGNOSIS — Z79899 Other long term (current) drug therapy: Secondary | ICD-10-CM | POA: Diagnosis not present

## 2024-09-21 DIAGNOSIS — N189 Chronic kidney disease, unspecified: Secondary | ICD-10-CM | POA: Insufficient documentation

## 2024-09-21 DIAGNOSIS — M545 Low back pain, unspecified: Secondary | ICD-10-CM | POA: Diagnosis present

## 2024-09-21 LAB — URINALYSIS, ROUTINE W REFLEX MICROSCOPIC
Bilirubin Urine: NEGATIVE
Glucose, UA: NEGATIVE mg/dL
Hgb urine dipstick: NEGATIVE
Ketones, ur: NEGATIVE mg/dL
Leukocytes,Ua: NEGATIVE
Nitrite: NEGATIVE
Protein, ur: NEGATIVE mg/dL
Specific Gravity, Urine: 1.004 — ABNORMAL LOW (ref 1.005–1.030)
pH: 6 (ref 5.0–8.0)

## 2024-09-21 MED ORDER — LIDOCAINE 5 % EX PTCH
1.0000 | MEDICATED_PATCH | CUTANEOUS | Status: DC
  Administered 2024-09-21: 1 via TRANSDERMAL
  Filled 2024-09-21: qty 1

## 2024-09-21 MED ORDER — OXYCODONE-ACETAMINOPHEN 5-325 MG PO TABS: 1.0000 | ORAL_TABLET | Freq: Four times a day (QID) | ORAL | 0 refills | Status: DC | PRN

## 2024-09-21 MED ORDER — PREDNISONE 10 MG PO TABS: ORAL_TABLET | ORAL | 0 refills | Status: DC

## 2024-09-21 MED ORDER — OXYCODONE-ACETAMINOPHEN 5-325 MG PO TABS
1.0000 | ORAL_TABLET | Freq: Once | ORAL | Status: AC
  Administered 2024-09-21: 1 via ORAL
  Filled 2024-09-21: qty 1

## 2024-09-21 MED ORDER — LIDOCAINE 5 % EX PTCH: 1.0000 | MEDICATED_PATCH | CUTANEOUS | 0 refills | Status: DC

## 2024-09-21 MED ORDER — LIDOCAINE VISCOUS HCL 2 % MT SOLN: 10.0000 mL | OROMUCOSAL | 0 refills | Status: DC | PRN

## 2024-09-21 MED ORDER — DEXAMETHASONE SODIUM PHOSPHATE 10 MG/ML IJ SOLN
10.0000 mg | Freq: Once | INTRAMUSCULAR | Status: AC
  Administered 2024-09-21: 10 mg via INTRAMUSCULAR
  Filled 2024-09-21: qty 1

## 2024-09-21 NOTE — ED Provider Notes (Signed)
 [place] [facility] AT [facility] Provider Note   CSN: [id number] Arrival date & time: 09/21/24  1524     Patient presents with: Back Pain   Tara Holmes is a 78 y.o. female with a history including hypertension, hyperlipidemia, type 2 diabetes, chronic kidney disease and history of gout and arthritis presenting for evaluation of left lower back pain.  She denies any injuries or falls but was extra active yesterday had to walk a great distance and spent more time standing than usual as she attended a funeral.  She felt okay when she went to bed last night but woke this morning with pain across her bilateral lower back.  Her pain is worse with movement and palpation, resolves at rest.  She denies fevers or chills, nausea or vomiting, denies radiation of pain, has no abdominal pain and no weakness or numbness in her legs.  She also denies dysuria or urinary incontinence.  She does have urgency which is a chronic condition.  She had a hydrocodone  at home, took a dose midday today which did not relieve her pain.   The history is provided by the patient.       Prior to Admission medications   Medication Sig Start Date End Date Taking? Authorizing Provider  lidocaine  (XYLOCAINE ) 2 % solution Use as directed 10 mLs in the mouth or throat every 3 (three) hours as needed for mouth pain (gargle and spit). 09/21/24  Yes Dustyn Armbrister, PA-C  oxyCODONE -acetaminophen  (PERCOCET/ROXICET) 5-325 MG tablet Take 1 tablet by mouth every 6 (six) hours as needed for severe pain (pain score 7-10). 09/21/24  Yes Tyshay Adee, PA-C  predniSONE (DELTASONE) 10 MG tablet Take 4 tablets on day 1,  3 tablets on day 2,  2 tablets on day 3 and 1 tablet on day 4. 09/21/24  Yes Marialy Urbanczyk, PA-C  ACCU-CHEK COMPACT PLUS test strip CHECK BLOOD SUGAR ONCE A DAY 05/07/18   [provider]  allopurinol (ZYLOPRIM) 100 MG tablet Take 100 mg by mouth daily.    [provider]   amLODipine (NORVASC) 2.5 MG tablet Take 2.5 mg by mouth daily.    [provider]  APIXABAN  (ELIQUIS ) VTE STARTER PACK (10MG  AND 5MG ) Take as directed on package: start with two-5mg  tablets twice daily for 7 days. On day 8, switch to one-5mg  tablet twice daily. 09/01/24   Suellen Cantor A, PA-C  carvedilol (COREG) 25 MG tablet Take 25 mg by mouth 2 (two) times daily.  03/11/14   [provider]  cholecalciferol (VITAMIN D3) 25 MCG (1000 UNIT) tablet Take 1,000 Units by mouth daily. 08/11/22   [provider]  cinacalcet (SENSIPAR) 30 MG tablet Take 30 mg by mouth 3 (three) times a week. M W F    [provider]  ezetimibe (ZETIA) 10 MG tablet Take 10 mg by mouth daily.    [provider]  ferrous sulfate 325 (65 FE) MG EC tablet Take 325 mg by mouth every evening. 06/23/17   [provider]  furosemide (LASIX) 40 MG tablet Take 40 mg by mouth daily. 01/25/24   [provider]  HYDROcodone -acetaminophen  (NORCO/VICODIN) 5-325 MG tablet Take 1 every 6 hours for pain and that is not relieved by Tylenol  alone 08/31/24   Zammit, Joseph, MD  losartan (COZAAR) 100 MG tablet Take 100 mg by mouth daily.    [provider]  oxybutynin (DITROPAN) 5 MG tablet Take 5 mg by mouth 2 (two) times  daily.     [provider]  rosuvastatin  (CRESTOR ) 10 MG tablet Take 10 mg by mouth daily.    [provider]    Allergies: Amoxicillin, Atorvastatin, Prednisone, and Iodinated contrast media    Review of Systems  Constitutional:  Negative for fever.  Respiratory:  Negative for shortness of breath.   Cardiovascular:  Negative for chest pain and leg swelling.  Gastrointestinal:  Negative for abdominal distention, abdominal pain and constipation.  Genitourinary:  Positive for urgency. Negative for difficulty urinating, dysuria, flank pain and frequency.  Musculoskeletal:  Positive for back pain. Negative for gait problem and joint  swelling.  Skin:  Negative for rash.  Neurological:  Negative for weakness and numbness.    Updated Vital Signs BP 138/60   Pulse 66   Temp 98.5 F (36.9 C) (Oral)   Resp 17   Ht 5' 3 (1.6 m)   Wt 65.5 kg   SpO2 99%   BMI 25.58 kg/m   Physical Exam Vitals and nursing note reviewed.  Constitutional:      Appearance: She is well-developed.  HENT:     Head: Normocephalic.  Eyes:     Conjunctiva/sclera: Conjunctivae normal.  Cardiovascular:     Rate and Rhythm: Normal rate.     Pulses: Normal pulses.     Comments: Pedal pulses normal. Pulmonary:     Effort: Pulmonary effort is normal.  Abdominal:     General: Bowel sounds are normal. There is no distension.     Palpations: Abdomen is soft. There is no mass.  Musculoskeletal:        General: Normal range of motion.     Cervical back: Normal range of motion and neck supple.     Lumbar back: Tenderness present. No swelling, edema or spasms.  Skin:    General: Skin is warm and dry.  Neurological:     Mental Status: She is alert.     Sensory: No sensory deficit.     Motor: No tremor or atrophy.     Gait: Gait normal.     Deep Tendon Reflexes:     Reflex Scores:      Patellar reflexes are 2+ on the right side and 2+ on the left side.    Comments: No strength deficit noted in hip and knee flexor and extensor muscle groups.  Ankle flexion and extension intact.     (all labs ordered are listed, but only abnormal results are displayed) Labs Reviewed  URINALYSIS, ROUTINE W REFLEX MICROSCOPIC - Abnormal; Notable for the following components:      Result Value   Color, Urine STRAW (*)    Specific Gravity, Urine 1.004 (*)    All other components within normal limits    EKG: None  Radiology: DG Lumbar Spine Complete Result Date: 09/21/2024 CLINICAL DATA:  Atraumatic low back pain. EXAM: LUMBAR SPINE - COMPLETE 4+ VIEW COMPARISON:  None Available. FINDINGS: There is no evidence of lumbar spine fracture. Alignment is  normal. Mild multilevel lateral osteophyte formation is seen throughout the lumbar spine. Marked severity facet joint hypertrophy is seen at the levels of L4-L5 and L5-S1. There is mild multilevel intervertebral disc space narrowing. IMPRESSION: 1. Multilevel degenerative changes, most prominent at the levels of L4-L5 and L5-S1. Electronically Signed   By: Suzen Dials M.D.   On: 09/21/2024 18:15     Procedures   Medications Ordered in the ED  dexamethasone  (DECADRON ) injection 10 mg (has no administration in time range)  lidocaine  (LIDODERM ) 5 % 1 patch (has no administration in time range)  oxyCODONE -acetaminophen  (PERCOCET/ROXICET) 5-325 MG per tablet 1 tablet (1 tablet Oral Given 09/21/24 1732)                                    Medical Decision Making Patient presenting with left lower back pain which is reproducible with palpation and with range of motion.  She has no focal neurologic deficits, she denies any injuries or falls but was more active than normal yesterday.  Her exam is reassuring, lumbar films were obtained to rule out stress fracture/compression fracture and is negative.  She does have some degenerative changes in her lumbar spine however.  She has a prescription for hydrocodone  which she takes as needed for her arthritis pain.  A tablet today did not improve her back pain.  She was given a very small quantity of oxycodone  and advised to not take her hydrocodone  while taking this other medicine.  She was also placed on a reduced dose prednisone taper and given a lidocaine  patch with prescription for same.  Advised close follow-up with her primary provider for any persistent or worsening symptoms.  Return precautions were outlined.  Amount and/or Complexity of Data Reviewed Labs: ordered. Radiology: ordered and independent interpretation performed.    Details: X-ray reviewed, degenerative changes at the L4-S1 region.  Risk Prescription drug management.         Final diagnoses:  Acute left-sided low back pain without sciatica    ED Discharge Orders          Ordered    lidocaine  (XYLOCAINE ) 2 % solution  Every  3 hours PRN        09/21/24 1859    oxyCODONE -acetaminophen  (PERCOCET/ROXICET) 5-325 MG tablet  Every 6 hours PRN        09/21/24 1859    predniSONE (DELTASONE) 10 MG tablet        09/21/24 1859               Birdena Clarity, PA-C 09/23/24 2047    Dean Clarity, MD 09/25/24 [phone]

## 2024-09-21 NOTE — ED Notes (Signed)
 Pt/family received d/c paperwork at this time. After going over the paperwork any questions, comments, or concerns were answered to the best of this nurse's knowledge. The pt/family verbally acknowledged the teachings/instructions.

## 2024-09-21 NOTE — Discharge Instructions (Addendum)
 Your imaging your x-rays today are reassuring.  Take your next dose of prednisone tomorrow as you received a dose of this here.  I also prescribed you a lidoderm  pain patch which you can wear for 12 hours, take off overnight then apply a new patch the next morning.  You may take the oxycodone  in place of your hydrocodone  for the next 3 days, use caution as this medication will make you drowsy and can also cause constipation.  You may want to add a stool softener to avoid this problem.  Plan follow-up with your primary doctor if your symptoms are not improving this week.  Also recommend a heating pad 20 minutes 3-4 times daily to your lower back.

## 2024-09-21 NOTE — ED Triage Notes (Signed)
 Pt with low back pain since waking up this morning.  Pt states pain is across her entire low back and denies any recent injury.

## 2024-09-23 NOTE — Progress Notes (Unsigned)
 Tara Holmes [address], [name]   CLINIC:  Medical Oncology/Hematology  PCP:  Tara Isles, Holmes [address] 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 female returns for routine follow-up of anemia of CKD.  She was last seen by Tara Barefoot PA-C on 07/02/2024.  At today's visit, she reports feeling fairly well.  *** ***No recent hospitalizations, surgeries, or changes in baseline health status.  She is tolerating Aranesp  well without any major side effects.  *** Blood pressure has been at goal.***  No symptoms concerning for DVT or PE.  ***  She  denies any bleeding episodes. *** No fatigue, pica, headaches, lightheadedness, syncope, chest pain, or dyspnea on exertion.   *** She continues to take iron tablet daily.  ***  She has 75***% energy and 75***% appetite. She endorses that she is maintaining a stable weight.  ASSESSMENT & PLAN:  1.  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 - Hematology workup showed elevated B12 and elevated MMA (likely artifact in the setting of CKD stage IV/V).  Normal folate. - SPEP negative for M spike.  Minimally elevated FLC ratio 1.80 (kappa 116.0, lambda 64.5), likely artifact in the setting of CKD stage IV/V.  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 - ESA therapy with Retacrit  initiated at [facility] around March 2024 - Switch to Aranesp  in June 2025 (per request of Tara Holmes).  Currently receiving Aranesp  60 mcg every 4 weeks for Hgb <11.0. - Colonoscopy (2023) by Tara Holmes in [place]. - Takes ferrous sulfate 325 mg daily.   - Denies BRBPR/melena.*** - Labs today *** - PLAN: *** TBD.  *** Hemoglobin not at goal.   Increase Aranesp  to 60 mcg every 4 weeks  for Hgb <11.0. - RTC 3 months - Continue oral iron supplement, but take every other day. If she continues to have functional iron deficiency (iron saturation <20%), we will consider IV iron repletion. - Check serum immunofixation to rule out plasma cell disorder in the setting of elevated FLC ratio.  2.  Social/Family History:  - Lives at home with her husband.  She independent of ADLS and iADLS. Retired Engineer, site. No chemical exposure. No tobacco use. No ETOH. - Mother had breast cancer. Father had colon cancer. No family history of anemia.   PLAN SUMMARY: *** >> CBC + Aranesp  every 4 weeks >> Same day labs (CBC/D, CMP, ferritin, iron/TIBC) + OFFICE visit + INJECTION in 3 months     REVIEW OF SYSTEMS:***  Review of Systems  Constitutional:  Negative for appetite change, chills, diaphoresis, fatigue, fever and unexpected weight change.  HENT:   Negative for lump/mass and nosebleeds.   Eyes:  Negative for eye problems.  Respiratory:  Negative for cough, hemoptysis and shortness of breath.   Cardiovascular:  Negative for chest pain, leg swelling and palpitations.  Gastrointestinal:  Negative for abdominal pain, blood in stool, constipation, diarrhea, nausea and vomiting.  Genitourinary:  Negative for hematuria.   Skin: Negative.   Neurological:  Negative for dizziness, headaches and light-headedness.  Hematological:  Does not bruise/bleed easily.     PHYSICAL EXAM:***  ECOG PERFORMANCE STATUS: 0 - Asymptomatic  There were no vitals filed for this visit.  There were no  vitals filed for this visit.  Physical Exam Constitutional:      Appearance: Normal appearance. She is normal weight.  Cardiovascular:     Rate and Rhythm: Normal rate.     Heart sounds: Normal heart sounds.  Pulmonary:     Breath sounds: Normal breath sounds.  Neurological:     General: No focal deficit present.     Mental Status: Mental status is at baseline.  Psychiatric:        Behavior: Behavior  normal. Behavior is cooperative.    PAST MEDICAL/SURGICAL HISTORY:  Past Medical History:  Diagnosis Date   Anemia    Aneurysm 1978   Arthritis    Chronic kidney disease    ESRD   Diabetes (HCC)    Encephalomalacia    GERD (gastroesophageal reflux disease)    Gout    Head injury 10/20/2020   d/t fall   Heart disease    Hypertension    Mild memory disturbance    Seizures (HCC)    Sleep apnea    Past Surgical History:  Procedure Laterality Date   AV FISTULA INSERTION W/ RF MAGNETIC GUIDANCE Left 08/08/2018   Procedure: AV FISTULA INSERTION W/RF MAGNETIC GUIDANCE;  Surgeon: Tara Holmes;  Location: [facility] INVASIVE CV LAB;  Service: Cardiovascular;  Laterality: Left;   BREAST SURGERY Left    fibroid   TONSILLECTOMY      SOCIAL HISTORY:  Social History   Socioeconomic History   Marital status: Married    Spouse name: Tara Holmes   Number of children: 2   Years of education: MA   Highest education level: Not on file  Occupational History   Occupation: Retired  Tobacco Use   Smoking status: Never   Smokeless tobacco: Never  Vaping Use   Vaping status: Never Used  Substance and Sexual Activity   Alcohol use: No   Drug use: Never   Sexual activity: Not on file  Other Topics Concern   Not on file  Social History Narrative   Patient lives at home with spouse.   Caffeine Use: none   Social Drivers of Corporate investment banker Strain: Not on file  Food Insecurity: Not on file  Transportation Needs: Not on file  Physical Activity: Not on file  Stress: Not on file  Social Connections: Not on file  Intimate Partner Violence: Not on file    FAMILY HISTORY:  Family History  Problem Relation Age of Onset   Heart attack Mother    Diabetes Mother    Colon cancer Father     CURRENT MEDICATIONS:  Outpatient Encounter Medications as of 09/24/2024  Medication Sig Note   ACCU-CHEK COMPACT PLUS test strip CHECK BLOOD SUGAR ONCE A DAY    allopurinol (ZYLOPRIM)  100 MG tablet Take 100 mg by mouth daily.    amLODipine (NORVASC) 2.5 MG tablet Take 2.5 mg by mouth daily.    APIXABAN  (ELIQUIS ) VTE STARTER PACK (10MG  AND 5MG ) Take as directed on package: start with two-5mg  tablets twice daily for 7 days. On day 8, switch to one-5mg  tablet twice daily.    carvedilol (COREG) 25 MG tablet Take 25 mg by mouth 2 (two) times daily.     cholecalciferol (VITAMIN D3) 25 MCG (1000 UNIT) tablet Take 1,000 Units by mouth daily.    cinacalcet (SENSIPAR) 30 MG tablet Take 30 mg by mouth 3 (three) times a week. M W F    ezetimibe (ZETIA) 10 MG tablet Take 10 mg by  mouth daily.    ferrous sulfate 325 (65 FE) MG EC tablet Take 325 mg by mouth every evening. 06/27/2019: Taking every evening   furosemide (LASIX) 40 MG tablet Take 40 mg by mouth daily.    HYDROcodone -acetaminophen  (NORCO/VICODIN) 5-325 MG tablet Take 1 every 6 hours for pain and that is not relieved by Tylenol  alone    lidocaine  (LIDODERM ) 5 % Place 1 patch onto the skin daily. Remove & Discard patch within 12 hours or as directed by Holmes    losartan (COZAAR) 100 MG tablet Take 100 mg by mouth daily.    oxybutynin (DITROPAN) 5 MG tablet Take 5 mg by mouth 2 (two) times daily.     oxyCODONE -acetaminophen  (PERCOCET/ROXICET) 5-325 MG tablet Take 1 tablet by mouth every 6 (six) hours as needed for severe pain (pain score 7-10).    predniSONE (DELTASONE) 10 MG tablet Take 4 tablets on day 1,  3 tablets on day 2,  2 tablets on day 3 and 1 tablet on day 4.    rosuvastatin  (CRESTOR ) 10 MG tablet Take 10 mg by mouth daily.    No facility-administered encounter medications on file as of 09/24/2024.    ALLERGIES:  Allergies  Allergen Reactions   Amoxicillin Other (See Comments)    Pt states allergic reaction in vaginal region. Has patient had a PCN reaction causing immediate rash, facial/tongue/throat swelling, SOB or lightheadedness with hypotension: No Has patient had a PCN reaction causing severe rash involving mucus  membranes or skin necrosis: No Has patient had a PCN reaction that required hospitalization: No Has patient had a PCN reaction occurring within the last 10 years: No Yeast infection reaction ONLY If all of the above answers are NO, then may proceed with Ce   Atorvastatin Other (See Comments)    MUSCLE ACHES  Other Reaction(s): Not available  Lipitor   Prednisone Other (See Comments)    ELEVATED BLOOD SUGAR   Iodinated Contrast Media Other (See Comments)    PATIENT IS UNAWARE OF THIS ALLERGY OR ANY REACTION TO THIS MEDICATION. Other reaction(s): Other (See Comments) PATIENT IS UNAWARE OF THIS ALLERGY OR ANY REACTION TO THIS MEDICATION.    LABORATORY DATA:  I have reviewed the labs as listed.  CBC    Component Value Date/Time   WBC 5.7 08/27/2024 1226   RBC 3.72 (L) 08/27/2024 1226   HGB 10.8 (L) 08/27/2024 1226   HCT 34.3 (L) 08/27/2024 1226   PLT 165 08/27/2024 1226   MCV 92.2 08/27/2024 1226   MCH 29.0 08/27/2024 1226   MCHC 31.5 08/27/2024 1226   RDW 14.0 08/27/2024 1226   LYMPHSABS 1.3 06/04/2024 1042   MONOABS 0.5 06/04/2024 1042   EOSABS 0.1 06/04/2024 1042   BASOSABS 0.0 06/04/2024 1042      Latest Ref Rng & Units 08/08/2024    8:24 AM 07/02/2024    1:38 PM 06/04/2024   10:42 AM  CMP  Glucose 70 - 99 mg/dL 879  98  892   BUN 8 - 23 mg/dL 67  67  72   Creatinine 0.44 - 1.00 mg/dL 6.34  7.13  6.61   Sodium 135 - 145 mmol/L 140  140  140   Potassium 3.5 - 5.1 mmol/L 4.1  4.6  3.7   Chloride 98 - 111 mmol/L 99  102  100   CO2 22 - 32 mmol/L 27  26  29    Calcium  8.9 - 10.3 mg/dL 9.4  9.4  9.3   Total Protein  6.5 - 8.1 g/dL   7.5   Total Bilirubin 0.0 - 1.2 mg/dL   0.9   Alkaline Phos 38 - 126 U/L   84   AST 15 - 41 U/L   48   ALT 0 - 44 U/L   75     DIAGNOSTIC IMAGING:  I have independently reviewed the relevant imaging and discussed with the patient.   WRAP UP:  All questions were answered. The patient knows to call the clinic with any problems,  questions or concerns.  Medical decision making: Moderate  Time spent on visit: I spent 20 minutes counseling the patient face to face. The total time spent in the appointment was 30 minutes and more than 50% was on counseling.  Tara CHRISTELLA Barefoot, PA-C  ***

## 2024-09-24 ENCOUNTER — Inpatient Hospital Stay: Attending: Physician Assistant | Admitting: Physician Assistant

## 2024-09-24 ENCOUNTER — Inpatient Hospital Stay

## 2024-09-24 VITALS — BP 134/63 | HR 58 | Temp 96.6°F | Resp 18 | Wt 144.8 lb

## 2024-09-24 DIAGNOSIS — N185 Chronic kidney disease, stage 5: Secondary | ICD-10-CM | POA: Insufficient documentation

## 2024-09-24 DIAGNOSIS — D649 Anemia, unspecified: Secondary | ICD-10-CM

## 2024-09-24 DIAGNOSIS — D631 Anemia in chronic kidney disease: Secondary | ICD-10-CM

## 2024-09-24 DIAGNOSIS — I120 Hypertensive chronic kidney disease with stage 5 chronic kidney disease or end stage renal disease: Secondary | ICD-10-CM | POA: Insufficient documentation

## 2024-09-24 DIAGNOSIS — I82622 Acute embolism and thrombosis of deep veins of left upper extremity: Secondary | ICD-10-CM

## 2024-09-24 DIAGNOSIS — N184 Chronic kidney disease, stage 4 (severe): Secondary | ICD-10-CM

## 2024-09-24 DIAGNOSIS — E1122 Type 2 diabetes mellitus with diabetic chronic kidney disease: Secondary | ICD-10-CM | POA: Diagnosis present

## 2024-09-24 LAB — IRON AND TIBC
Iron: 96 ug/dL (ref 28–170)
Saturation Ratios: 42 % — ABNORMAL HIGH (ref 10.4–31.8)
TIBC: 227 ug/dL — ABNORMAL LOW (ref 250–450)
UIBC: 131 ug/dL

## 2024-09-24 LAB — CBC WITH DIFFERENTIAL/PLATELET
Abs Immature Granulocytes: 0.03 K/uL (ref 0.00–0.07)
Basophils Absolute: 0 K/uL (ref 0.0–0.1)
Basophils Relative: 0 %
Eosinophils Absolute: 0 K/uL (ref 0.0–0.5)
Eosinophils Relative: 0 %
HCT: 33.4 % — ABNORMAL LOW (ref 36.0–46.0)
Hemoglobin: 10.5 g/dL — ABNORMAL LOW (ref 12.0–15.0)
Immature Granulocytes: 0 %
Lymphocytes Relative: 8 %
Lymphs Abs: 0.8 K/uL (ref 0.7–4.0)
MCH: 28.2 pg (ref 26.0–34.0)
MCHC: 31.4 g/dL (ref 30.0–36.0)
MCV: 89.8 fL (ref 80.0–100.0)
Monocytes Absolute: 0.5 K/uL (ref 0.1–1.0)
Monocytes Relative: 5 %
Neutro Abs: 8.2 K/uL — ABNORMAL HIGH (ref 1.7–7.7)
Neutrophils Relative %: 87 %
Platelets: 154 K/uL (ref 150–400)
RBC: 3.72 MIL/uL — ABNORMAL LOW (ref 3.87–5.11)
RDW: 13.5 % (ref 11.5–15.5)
WBC: 9.5 K/uL (ref 4.0–10.5)
nRBC: 0 % (ref 0.0–0.2)

## 2024-09-24 LAB — COMPREHENSIVE METABOLIC PANEL WITH GFR
ALT: 44 U/L (ref 0–44)
AST: 23 U/L (ref 15–41)
Albumin: 4 g/dL (ref 3.5–5.0)
Alkaline Phosphatase: 81 U/L (ref 38–126)
Anion gap: 16 — ABNORMAL HIGH (ref 5–15)
BUN: 92 mg/dL — ABNORMAL HIGH (ref 8–23)
CO2: 27 mmol/L (ref 22–32)
Calcium: 9.6 mg/dL (ref 8.9–10.3)
Chloride: 98 mmol/L (ref 98–111)
Creatinine, Ser: 3.19 mg/dL — ABNORMAL HIGH (ref 0.44–1.00)
GFR, Estimated: 15 mL/min — ABNORMAL LOW (ref >60)
Glucose, Bld: 227 mg/dL — ABNORMAL HIGH (ref 70–99)
Potassium: 3.4 mmol/L — ABNORMAL LOW (ref 3.5–5.1)
Sodium: 140 mmol/L (ref 135–145)
Total Bilirubin: 0.5 mg/dL (ref 0.0–1.2)
Total Protein: 7.4 g/dL (ref 6.5–8.1)

## 2024-09-24 LAB — FERRITIN: Ferritin: 619 ng/mL — ABNORMAL HIGH (ref 11–307)

## 2024-09-24 MED ORDER — DARBEPOETIN ALFA 60 MCG/0.3ML IJ SOSY
60.0000 ug | PREFILLED_SYRINGE | Freq: Once | INTRAMUSCULAR | Status: AC
  Administered 2024-09-24: 60 ug via SUBCUTANEOUS
  Filled 2024-09-24: qty 0.3

## 2024-09-24 NOTE — Patient Instructions (Signed)

## 2024-09-24 NOTE — Progress Notes (Signed)
 Patient's Hgb 10.5 and blood pressure stable. Patient  tolerated aranesp  injection with no complaints voiced.  Site clean and dry with no bruising or swelling noted at site.  See MAR for details.  Band aid applied.  Patient stable during and after injection.  Vss with discharge and left in satisfactory condition with no s/s of distress noted. All follow ups as scheduled.   Tara Holmes

## 2024-09-24 NOTE — Patient Instructions (Addendum)
 [facility] at [facility] **VISIT SUMMARY & IMPORTANT INSTRUCTIONS **   You were seen today by Pleasant Barefoot PA-C for your follow-up visit.    ANEMIA FROM CHRONIC KIDNEY DISEASE Chronic kidney disease makes it difficult for your body to make new blood cells. Your blood levels are lower than they need to be. We will continue your Aranesp  injections every 4 weeks.   You can STOP your iron supplement at this time.  BLOOD CLOT IN ARM: This is most likely related to your AV fistula, and needs to be worked up by vascular specialist.  We will check to make sure you do not have any underlying blood clotting disorder.  FOLLOW-UP APPOINTMENT: 3 months  ** Thank you for trusting me with your healthcare!  I strive to provide all of my patients with quality care at each visit.  If you receive a survey for this visit, I would be so grateful to you for taking the time to provide feedback.  Thank you in advance!  ~ Shedric Fredericks                                        Dr. Mickiel Davonna Pleasant Barefoot, PA-C    Delon Hope, NP   - - - - - - - - - - - - - - - - - -     Thank you for choosing  [facility] Center at [facility] to provide your oncology and hematology care.  To afford each patient quality time with our provider, please arrive at least 15 minutes before your scheduled appointment time.   If you have a lab appointment with the Cancer Center please come in thru the Main Entrance and check in at the main information desk.  You need to re-schedule your appointment should you arrive 10 or more minutes late.  We strive to give you quality time with our providers, and arriving late affects you and other patients whose appointments are after yours.  Also, if you no show three or more times for appointments you may be dismissed from the clinic at the providers discretion.     Again, thank you for choosing [facility].  Our hope is that these  requests will decrease the amount of time that you wait before being seen by our physicians.       _____________________________________________________________  Should you have questions after your visit to [facility], please contact our office at [phone] and follow the prompts.  Our office hours are 8:00 a.m. and 4:30 p.m. Monday - Friday.  Please note that voicemails left after 4:00 p.m. may not be returned until the following business day.  We are closed weekends and major holidays.  You do have access to a nurse 24-7, just call the main number to the clinic [phone] and do not press any options, hold on the line and a nurse will answer the phone.    For prescription refill requests, have your pharmacy contact our office and allow 72 hours.

## 2024-10-03 ENCOUNTER — Ambulatory Visit: Attending: Nurse Practitioner | Admitting: Nurse Practitioner

## 2024-10-03 ENCOUNTER — Encounter: Payer: Self-pay | Admitting: Nurse Practitioner

## 2024-10-03 ENCOUNTER — Telehealth: Payer: Self-pay | Admitting: *Deleted

## 2024-10-03 VITALS — BP 100/62 | HR 70 | Ht 63.0 in | Wt 145.7 lb

## 2024-10-03 DIAGNOSIS — E785 Hyperlipidemia, unspecified: Secondary | ICD-10-CM | POA: Insufficient documentation

## 2024-10-03 DIAGNOSIS — I82622 Acute embolism and thrombosis of deep veins of left upper extremity: Secondary | ICD-10-CM | POA: Insufficient documentation

## 2024-10-03 DIAGNOSIS — I10 Essential (primary) hypertension: Secondary | ICD-10-CM | POA: Insufficient documentation

## 2024-10-03 DIAGNOSIS — Z79899 Other long term (current) drug therapy: Secondary | ICD-10-CM | POA: Insufficient documentation

## 2024-10-03 DIAGNOSIS — E876 Hypokalemia: Secondary | ICD-10-CM | POA: Insufficient documentation

## 2024-10-03 DIAGNOSIS — N184 Chronic kidney disease, stage 4 (severe): Secondary | ICD-10-CM | POA: Diagnosis not present

## 2024-10-03 MED ORDER — APIXABAN 5 MG PO TABS
5.0000 mg | ORAL_TABLET | Freq: Two times a day (BID) | ORAL | 1 refills | Status: DC
Start: 2024-10-03 — End: 2024-10-31

## 2024-10-03 MED ORDER — POTASSIUM CHLORIDE CRYS ER 10 MEQ PO TBCR: 10.0000 meq | EXTENDED_RELEASE_TABLET | Freq: Every day | ORAL | 1 refills | Status: DC

## 2024-10-03 NOTE — Telephone Encounter (Signed)
-----   Message from [facility] sent at 10/03/2024  9:25 AM EDT ----- Regarding: Fill Eliquis  Please fill Eliquis  5 Mg BID at [employer]

## 2024-10-03 NOTE — Progress Notes (Addendum)
 Cardiology Office Note   Date:  10/03/2024  ID:  Sharalyn, Lomba 14-Jan-1946, MRN [id number] PCP: Maree Isles, MD  [facility] Providers Cardiologist:  Diannah SHAUNNA Maywood, MD     History of Present Illness Tara Holmes is a 78 y.o. female with a PMH of HTN, T2DM, HLD, obesity, left brain aneurysm, s/p clipping OSA on CPAP, CKD stage IV, anemia, GERD, who presents today for scheduled follow-up.   Last seen by Dr. Burnard on May 22, 2024. Was doing well at the time. Follows Dr. Rachele. Reported that she underwent insertion of AV fistula in 2019,. But had not had to initiate dialysis.   She is here to transfer her cardiac care to [facility]. She is here for follow-up. She tells me she is now on Eliquis . Diagnosed with left upper extremity DVT in September 2025, almost has completed starter pack. Being managed and followed by Hematology/Oncology. Overall doing well from a cardiac perspective. She wants to know if she needs to have her fistula removed as she has had this for 5 years and has never had it used/ and has never had dialysis done, she says she will discuss this with her kidney specialist. Denies any chest pain, shortness of breath, palpitations, syncope, presyncope, dizziness, orthopnea, PND, swelling or significant weight changes, acute bleeding, or claudication.  ROS: Negative. See HPI.   Studies Reviewed  EKG: EKG is not ordered today.   Venous ultrasound left upper extremity (DVT) 08/2024:  IMPRESSION: 1. Occlusive left upper extremity DVT in the left ulnar vein. 2. Left upper extremity av fistula appears patent.  Lexiscan  04/2017:  Nuclear stress EF: 55%. The left ventricular ejection fraction is normal (55-65%). There was no ST segment deviation noted during stress. The study is normal. This is a low risk study.   Normal resting and stress perfusion. No ischemia or infarction EF 55%   Physical Exam VS:  BP 100/62   Pulse 70    Ht 5' 3 (1.6 m)   Wt 145 lb 11.2 oz (66.1 kg)   SpO2 100%   BMI 25.81 kg/m        Wt Readings from Last 3 Encounters:  10/03/24 145 lb 11.2 oz (66.1 kg)  09/24/24 144 lb 13.5 oz (65.7 kg)  09/21/24 144 lb 6.4 oz (65.5 kg)    GEN: Well nourished, well developed in no acute distress NECK: No JVD; No carotid bruits CARDIAC: S1/S2, RRR, no murmurs, rubs, gallops RESPIRATORY:  Clear to auscultation without rales, wheezing or rhonchi  ABDOMEN: Soft, non-tender, non-distended EXTREMITIES:  No edema; No deformity   ASSESSMENT AND PLAN  HTN BP stable and at goal. Discussed to monitor BP at home at least 2 hours after medications and sitting for 5-10 minutes.  No medication changes at this time. Heart healthy diet and regular cardiovascular exercise encouraged.   HLD PCP is managing this.  Will defer this to PCP.  Continue rosuvastatin . Heart healthy diet and regular cardiovascular exercise encouraged.   Acute DVT See venous ultrasound noted above.  Diagnosed in September 2025.  She has almost completed starter pack.  Will refill Eliquis  to 5 mg twice daily.  This is being managed by hematology/oncology.  CKD stage IV Most recent serum creatinine 3.19 with eGFR 15.  Continue follow-up with nephrology.  Avoid nephrotoxic agents.  No medication changes at this time.  Recommended to discuss her question regarding her fistula with her kidney specialist.  5.  Hypokalemia, medication management Most recent potassium  level 3.4.  She is on Lasix.  Will start potassium chloride 10 mill equivalent daily and recheck BMET.      Dispo: Follow-up with MD/APP in 6 months or sooner if anything changes.  Signed, Almarie Crate, NP

## 2024-10-03 NOTE — Telephone Encounter (Signed)
 Prescription refill request for Eliquis  received.  Last office visit:peck, 10/03/2024 Scr: 3.19, 09/24/2024 Age: 78 yo  Weight: 66.1 kg   Refill sent.

## 2024-10-03 NOTE — Patient Instructions (Addendum)
 Medication Instructions:  Your physician has recommended you make the following change in your medication:  Please Continue Eliquis  at 5 Mg Twice daily  Please start Potassium Chloride 10 MeQ daily   Labwork: In 1-2 weeks at [facility]   Testing/Procedures: None   Follow-Up: Your physician recommends that you schedule a follow-up appointment in: 6 months with Dr.Mallipeddi   Any Other Special Instructions Will Be Listed Below (If Applicable).  If you need a refill on your cardiac medications before your next appointment, please call your pharmacy.

## 2024-10-22 ENCOUNTER — Inpatient Hospital Stay: Attending: Physician Assistant

## 2024-10-22 ENCOUNTER — Inpatient Hospital Stay

## 2024-10-22 VITALS — BP 148/64 | HR 79 | Temp 97.6°F | Resp 16

## 2024-10-22 DIAGNOSIS — I120 Hypertensive chronic kidney disease with stage 5 chronic kidney disease or end stage renal disease: Secondary | ICD-10-CM | POA: Insufficient documentation

## 2024-10-22 DIAGNOSIS — N185 Chronic kidney disease, stage 5: Secondary | ICD-10-CM | POA: Insufficient documentation

## 2024-10-22 DIAGNOSIS — D631 Anemia in chronic kidney disease: Secondary | ICD-10-CM | POA: Insufficient documentation

## 2024-10-22 DIAGNOSIS — E1122 Type 2 diabetes mellitus with diabetic chronic kidney disease: Secondary | ICD-10-CM | POA: Insufficient documentation

## 2024-10-22 DIAGNOSIS — D649 Anemia, unspecified: Secondary | ICD-10-CM

## 2024-10-22 DIAGNOSIS — I82622 Acute embolism and thrombosis of deep veins of left upper extremity: Secondary | ICD-10-CM

## 2024-10-22 LAB — CBC
HCT: 30.7 % — ABNORMAL LOW (ref 36.0–46.0)
Hemoglobin: 9.3 g/dL — ABNORMAL LOW (ref 12.0–15.0)
MCH: 28.7 pg (ref 26.0–34.0)
MCHC: 30.3 g/dL (ref 30.0–36.0)
MCV: 94.8 fL (ref 80.0–100.0)
Platelets: 160 K/uL (ref 150–400)
RBC: 3.24 MIL/uL — ABNORMAL LOW (ref 3.87–5.11)
RDW: 13.8 % (ref 11.5–15.5)
WBC: 4.9 K/uL (ref 4.0–10.5)
nRBC: 0 % (ref 0.0–0.2)

## 2024-10-22 LAB — D-DIMER, QUANTITATIVE: D-Dimer, Quant: 0.69 ug{FEU}/mL — ABNORMAL HIGH (ref 0.00–0.50)

## 2024-10-22 MED ORDER — DARBEPOETIN ALFA 60 MCG/0.3ML IJ SOSY
60.0000 ug | PREFILLED_SYRINGE | Freq: Once | INTRAMUSCULAR | Status: AC
  Administered 2024-10-22: 60 ug via SUBCUTANEOUS
  Filled 2024-10-22: qty 0.3

## 2024-10-22 NOTE — Patient Instructions (Signed)
 [facility] - A DEPT OF [facility]  Discharge Instructions: Thank you for choosing [facility] to provide your oncology and hematology care.  If you have a lab appointment with the Cancer Center - please note that after April 8th, 2024, all labs will be drawn in the cancer center.  You do not have to check in or register with the main entrance as you have in the past but will complete your check-in in the cancer center.  Wear comfortable clothing and clothing appropriate for easy access to any Portacath or PICC line.   We strive to give you quality time with your provider. You may need to reschedule your appointment if you arrive late (15 or more minutes).  Arriving late affects you and other patients whose appointments are after yours.  Also, if you miss three or more appointments without notifying the office, you may be dismissed from the clinic at the provider's discretion.      For prescription refill requests, have your pharmacy contact our office and allow 72 hours for refills to be completed.    Today you received the following aranesp , return as scheduled.   To help prevent nausea and vomiting after your treatment, we encourage you to take your nausea medication as directed.  BELOW ARE SYMPTOMS THAT SHOULD BE REPORTED IMMEDIATELY: *FEVER GREATER THAN 100.4 F (38 C) OR HIGHER *CHILLS OR SWEATING *NAUSEA AND VOMITING THAT IS NOT CONTROLLED WITH YOUR NAUSEA MEDICATION *UNUSUAL SHORTNESS OF BREATH *UNUSUAL BRUISING OR BLEEDING *URINARY PROBLEMS (pain or burning when urinating, or frequent urination) *BOWEL PROBLEMS (unusual diarrhea, constipation, pain near the anus) TENDERNESS IN MOUTH AND THROAT WITH OR WITHOUT PRESENCE OF ULCERS (sore throat, sores in mouth, or a toothache) UNUSUAL RASH, SWELLING OR PAIN  UNUSUAL VAGINAL DISCHARGE OR ITCHING   Items with * indicate a potential emergency and should be followed up as soon as possible or  go to the Emergency Department if any problems should occur.  Please show the CHEMOTHERAPY ALERT CARD or IMMUNOTHERAPY ALERT CARD at check-in to the Emergency Department and triage nurse.  Should you have questions after your visit or need to cancel or reschedule your appointment, please contact [facility] [place] - A DEPT OF [facility] [phone]  and follow the prompts.  Office hours are 8:00 a.m. to 4:30 p.m. Monday - Friday. Please note that voicemails left after 4:00 p.m. may not be returned until the following business day.  We are closed weekends and major holidays. You have access to a nurse at all times for urgent questions. Please call the main number to the clinic [phone] and follow the prompts.  For any non-urgent questions, you may also contact your provider using MyChart. We now offer e-Visits for anyone 63 and older to request care online for non-urgent symptoms. For details visit [email or web address].   Also download the MyChart app! Go to the app store, search MyChart, open the app, select , and log in with your MyChart username and password.

## 2024-10-22 NOTE — Progress Notes (Signed)
Patient presents today for Aranesp injection. Hemoglobin reviewed prior to administration. VSS. Injection tolerated without incident or complaint. Patient stable during and after injection. See MAR for details. Patient discharged in satisfactory condition with no s/s of distress noted. 

## 2024-10-25 LAB — LUPUS ANTICOAGULANT PANEL
DRVVT: 98.4 s — ABNORMAL HIGH (ref 0.0–47.0)
PTT Lupus Anticoagulant: 44.5 s — ABNORMAL HIGH (ref 0.0–43.5)

## 2024-10-25 LAB — PROTHROMBIN GENE MUTATION

## 2024-10-25 LAB — BETA-2-GLYCOPROTEIN I ABS, IGG/M/A
Beta-2 Glyco I IgG: 13 GPI IgG units (ref 0–20)
Beta-2-Glycoprotein I IgA: <9 GPI IgA units (ref 0–25)
Beta-2-Glycoprotein I IgM: <9 GPI IgM units (ref 0–32)

## 2024-10-25 LAB — DRVVT MIX: dRVVT Mix: 75.8 s — ABNORMAL HIGH (ref 0.0–40.4)

## 2024-10-25 LAB — FACTOR 5 LEIDEN

## 2024-10-25 LAB — HEXAGONAL PHASE PHOSPHOLIPID: Hexagonal Phase Phospholipid: 3 s (ref 0–11)

## 2024-10-25 LAB — PTT-LA MIX: PTT-LA Mix: 41.7 s — ABNORMAL HIGH (ref 0.0–40.5)

## 2024-10-25 LAB — DRVVT CONFIRM: dRVVT Confirm: 0.9 ratio (ref 0.8–1.2)

## 2024-10-26 LAB — CARDIOLIPIN ANTIBODIES, IGG, IGM, IGA
Anticardiolipin IgA: <9 U/mL (ref 0–11)
Anticardiolipin IgG: <9 GPL U/mL (ref 0–14)
Anticardiolipin IgM: <9 [MPL'U]/mL (ref 0–12)

## 2024-10-29 ENCOUNTER — Other Ambulatory Visit (INDEPENDENT_AMBULATORY_CARE_PROVIDER_SITE_OTHER): Payer: Self-pay | Admitting: Nurse Practitioner

## 2024-10-29 DIAGNOSIS — I82622 Acute embolism and thrombosis of deep veins of left upper extremity: Secondary | ICD-10-CM

## 2024-10-30 ENCOUNTER — Encounter (INDEPENDENT_AMBULATORY_CARE_PROVIDER_SITE_OTHER): Payer: Self-pay | Admitting: Nurse Practitioner

## 2024-10-30 ENCOUNTER — Ambulatory Visit (INDEPENDENT_AMBULATORY_CARE_PROVIDER_SITE_OTHER): Admitting: Nurse Practitioner

## 2024-10-30 ENCOUNTER — Ambulatory Visit (INDEPENDENT_AMBULATORY_CARE_PROVIDER_SITE_OTHER)

## 2024-10-30 VITALS — BP 147/69 | HR 71 | Resp 18 | Ht 63.0 in | Wt 145.8 lb

## 2024-10-30 DIAGNOSIS — I82622 Acute embolism and thrombosis of deep veins of left upper extremity: Secondary | ICD-10-CM | POA: Diagnosis not present

## 2024-10-30 DIAGNOSIS — I10 Essential (primary) hypertension: Secondary | ICD-10-CM

## 2024-10-30 DIAGNOSIS — N185 Chronic kidney disease, stage 5: Secondary | ICD-10-CM

## 2024-10-31 ENCOUNTER — Other Ambulatory Visit: Payer: Self-pay

## 2024-11-04 MED ORDER — APIXABAN 5 MG PO TABS: 5.0000 mg | ORAL_TABLET | Freq: Two times a day (BID) | ORAL | 6 refills | Status: AC

## 2024-11-04 NOTE — Telephone Encounter (Signed)
 Pt last saw Tara Crate, NP on 10/03/24, last labs 09/24/24 Creat 3.19, age 78, weight 66.1kg, based on specified criteria pt is on appropriate dosage of Eliquis  5mg  BID for afib.  Will refill rx.

## 2024-11-10 ENCOUNTER — Encounter (INDEPENDENT_AMBULATORY_CARE_PROVIDER_SITE_OTHER): Payer: Self-pay | Admitting: Nurse Practitioner

## 2024-11-10 NOTE — Progress Notes (Signed)
 Subjective:    Patient ID: Tara Holmes, female    DOB: May 01, 1946, 78 y.o.   MRN: [id number] Chief Complaint  Patient presents with   Follow-up    occlusive left upper extremity DVT in the ulnar vein + left upper extremity AV fistula appears patent. wise, tiffany.  Pt concerns of fistula and wanting status of blood clot    The patient presents today after formation of a DVT in her left ulnar vein.  She notes that she began to have intense pain in her left upper extremity.  She also has a left upper extremity wavelinQ AV fistula.  This fistula has not been utilized.  It was noted that during the evaluation there is a DVT in the left ulnar vein but her fistula remained patent.  Today he has a soft thrill but an audible bruit which is consistent with the exam from her previous visit in July.  Today noninvasive studies show resolution of the DVT in the left upper extremity.  The patient is currently on Eliquis  and denies any significant issues with taking it.    Review of Systems  Cardiovascular:  Negative for leg swelling.  All other systems reviewed and are negative.      Objective:   Physical Exam Vitals reviewed.  HENT:     Head: Normocephalic.  Cardiovascular:     Rate and Rhythm: Normal rate.     Pulses:          Radial pulses are 1+ on the left side.  Pulmonary:     Effort: Pulmonary effort is normal.  Skin:    General: Skin is warm and dry.  Neurological:     Mental Status: She is alert and oriented to person, place, and time.  Psychiatric:        Mood and Affect: Mood normal.        Behavior: Behavior normal.        Thought Content: Thought content normal.        Judgment: Judgment normal.     BP (!) 147/69   Pulse 71   Resp 18   Ht 5' 3 (1.6 m)   Wt 145 lb 12.8 oz (66.1 kg)   BMI 25.83 kg/m   Past Medical History:  Diagnosis Date   Anemia    Aneurysm 1978   Arthritis    Chronic kidney disease    ESRD   Diabetes (HCC)     Encephalomalacia    GERD (gastroesophageal reflux disease)    Gout    Head injury 10/20/2020   d/t fall   Heart disease    Hypertension    Mild memory disturbance    Seizures (HCC)    Sleep apnea     Social History   Socioeconomic History   Marital status: Married    Spouse name: [place]   Number of children: 2   Years of education: MA   Highest education level: Not on file  Occupational History   Occupation: Retired  Tobacco Use   Smoking status: Never   Smokeless tobacco: Never  Vaping Use   Vaping status: Never Used  Substance and Sexual Activity   Alcohol use: No   Drug use: Never   Sexual activity: Not on file  Other Topics Concern   Not on file  Social History Narrative   Patient lives at home with spouse.   Caffeine Use: none   Social Drivers of Corporate Investment Banker Strain: Not on file  Food Insecurity: Not on file  Transportation Needs: Not on file  Physical Activity: Not on file  Stress: Not on file  Social Connections: Not on file  Intimate Partner Violence: Not on file    Past Surgical History:  Procedure Laterality Date   AV FISTULA INSERTION W/ RF MAGNETIC GUIDANCE Left 08/08/2018   Procedure: AV FISTULA INSERTION W/RF MAGNETIC GUIDANCE;  Surgeon: Jama Cordella MATSU, MD;  Location: [facility] INVASIVE CV LAB;  Service: Cardiovascular;  Laterality: Left;   BREAST SURGERY Left    fibroid   TONSILLECTOMY      Family History  Problem Relation Age of Onset   Heart attack Mother    Diabetes Mother    Colon cancer Father     Allergies  Allergen Reactions   Amoxicillin Other (See Comments)    Pt states allergic reaction in vaginal region. Has patient had a PCN reaction causing immediate rash, facial/tongue/throat swelling, SOB or lightheadedness with hypotension: No Has patient had a PCN reaction causing severe rash involving mucus membranes or skin necrosis: No Has patient had a PCN reaction that required hospitalization: No Has patient had  a PCN reaction occurring within the last 10 years: No Yeast infection reaction ONLY If all of the above answers are NO, then may proceed with Ce   Atorvastatin Other (See Comments)    MUSCLE ACHES  Other Reaction(s): Not available  Lipitor   Prednisone  Other (See Comments)    ELEVATED BLOOD SUGAR   Iodinated Contrast Media Other (See Comments)    PATIENT IS UNAWARE OF THIS ALLERGY OR ANY REACTION TO THIS MEDICATION. Other reaction(s): Other (See Comments) PATIENT IS UNAWARE OF THIS ALLERGY OR ANY REACTION TO THIS MEDICATION.       Latest Ref Rng & Units 10/22/2024    8:29 AM 09/24/2024    8:39 AM 08/27/2024   12:26 PM  CBC  WBC 4.0 - 10.5 K/uL 4.9  9.5  5.7   Hemoglobin 12.0 - 15.0 g/dL 9.3  89.4  89.1   Hematocrit 36.0 - 46.0 % 30.7  33.4  34.3   Platelets 150 - 400 K/uL 160  154  165       CMP     Component Value Date/Time   NA 140 09/24/2024 0839   K 3.4 (L) 09/24/2024 0839   CL 98 09/24/2024 0839   CO2 27 09/24/2024 0839   GLUCOSE 227 (H) 09/24/2024 0839   BUN 92 (H) 09/24/2024 0839   CREATININE 3.19 (H) 09/24/2024 0839   CALCIUM  9.6 09/24/2024 0839   CALCIUM  9.5 03/12/2024 0903   PROT 7.4 09/24/2024 0839   ALBUMIN 4.0 09/24/2024 0839   AST 23 09/24/2024 0839   ALT 44 09/24/2024 0839   ALKPHOS 81 09/24/2024 0839   BILITOT 0.5 09/24/2024 0839   GFRNONAA 15 (L) 09/24/2024 0839     No results found.     Assessment & Plan:   1. Acute deep vein thrombosis (DVT) of ulnar vein of left upper extremity (HCC) (Primary) Today noninvasive studies indicate that the DVT has resolved.  It is questionable whether the fistula was the source of the DVT.  However in reviewing the notes and the original studies her fistula was patent when the DVT occurred, which decreases the likelihood as it being the source.  To err on the side of caution we will treat this DVT as if it is unprovoked.  With that she should remain on anticoagulation for least 6 months.  She does have an  upcoming visit with us  again in July to reevaluate her fistula.   2. Primary hypertension Continue antihypertensive medications as already ordered, these medications have been reviewed and there are no changes at this time.  3. Chronic kidney disease, stage V (HCC) Currently still not on dialysis.   Current Outpatient Medications on File Prior to Visit  Medication Sig Dispense Refill   ACCU-CHEK COMPACT PLUS test strip CHECK BLOOD SUGAR ONCE A DAY  3   allopurinol (ZYLOPRIM) 100 MG tablet Take 100 mg by mouth daily.     amLODipine (NORVASC) 2.5 MG tablet Take 2.5 mg by mouth daily.     carvedilol (COREG) 25 MG tablet Take 25 mg by mouth 2 (two) times daily.      cholecalciferol (VITAMIN D3) 25 MCG (1000 UNIT) tablet Take 1,000 Units by mouth daily.     cinacalcet (SENSIPAR) 30 MG tablet Take 30 mg by mouth 3 (three) times a week. M W F     ezetimibe (ZETIA) 10 MG tablet Take 10 mg by mouth daily.     furosemide (LASIX) 40 MG tablet Take 40 mg by mouth daily.     losartan (COZAAR) 100 MG tablet Take 25 mg by mouth daily.     oxybutynin (DITROPAN) 5 MG tablet Take 5 mg by mouth 2 (two) times daily.      potassium chloride  (KLOR-CON ) 10 MEQ tablet Take 10 mEq by mouth daily.     potassium chloride  SA (KLOR-CON  M) 10 MEQ tablet Take 1 tablet (10 mEq total) by mouth daily. 90 tablet 1   rosuvastatin  (CRESTOR ) 10 MG tablet Take 10 mg by mouth daily.     HYDROcodone -acetaminophen  (NORCO/VICODIN) 5-325 MG tablet Take 1 every 6 hours for pain and that is not relieved by Tylenol  alone (Patient not taking: Reported on 10/30/2024) 20 tablet 0   lidocaine  (LIDODERM ) 5 % Place 1 patch onto the skin daily. Remove & Discard patch within 12 hours or as directed by MD (Patient not taking: Reported on 10/30/2024) 30 patch 0   oxyCODONE -acetaminophen  (PERCOCET/ROXICET) 5-325 MG tablet Take 1 tablet by mouth every 6 (six) hours as needed for severe pain (pain score 7-10). (Patient not taking: Reported on  10/30/2024) 12 tablet 0   predniSONE  (DELTASONE ) 10 MG tablet Take 4 tablets on day 1,  3 tablets on day 2,  2 tablets on day 3 and 1 tablet on day 4. (Patient not taking: Reported on 10/30/2024) 10 tablet 0   No current facility-administered medications on file prior to visit.    There are no Patient Instructions on file for this visit. No follow-ups on file.   Anddy Wingert E Mitzie Marlar, NP

## 2024-11-18 ENCOUNTER — Ambulatory Visit: Admitting: Nurse Practitioner

## 2024-11-19 ENCOUNTER — Inpatient Hospital Stay: Attending: Physician Assistant

## 2024-11-19 ENCOUNTER — Inpatient Hospital Stay

## 2024-11-19 VITALS — BP 139/64 | HR 59 | Temp 97.7°F | Resp 16

## 2024-11-19 DIAGNOSIS — D631 Anemia in chronic kidney disease: Secondary | ICD-10-CM | POA: Insufficient documentation

## 2024-11-19 DIAGNOSIS — I120 Hypertensive chronic kidney disease with stage 5 chronic kidney disease or end stage renal disease: Secondary | ICD-10-CM | POA: Diagnosis present

## 2024-11-19 DIAGNOSIS — D649 Anemia, unspecified: Secondary | ICD-10-CM

## 2024-11-19 DIAGNOSIS — N185 Chronic kidney disease, stage 5: Secondary | ICD-10-CM | POA: Insufficient documentation

## 2024-11-19 DIAGNOSIS — E1122 Type 2 diabetes mellitus with diabetic chronic kidney disease: Secondary | ICD-10-CM | POA: Insufficient documentation

## 2024-11-19 LAB — CBC
HCT: 33.3 % — ABNORMAL LOW (ref 36.0–46.0)
Hemoglobin: 10.1 g/dL — ABNORMAL LOW (ref 12.0–15.0)
MCH: 28.5 pg (ref 26.0–34.0)
MCHC: 30.3 g/dL (ref 30.0–36.0)
MCV: 94.1 fL (ref 80.0–100.0)
Platelets: 149 K/uL — ABNORMAL LOW (ref 150–400)
RBC: 3.54 MIL/uL — ABNORMAL LOW (ref 3.87–5.11)
RDW: 13.5 % (ref 11.5–15.5)
WBC: 5.9 K/uL (ref 4.0–10.5)
nRBC: 0 % (ref 0.0–0.2)

## 2024-11-19 MED ORDER — DARBEPOETIN ALFA 60 MCG/0.3ML IJ SOSY
60.0000 ug | PREFILLED_SYRINGE | Freq: Once | INTRAMUSCULAR | Status: AC
  Administered 2024-11-19: 60 ug via SUBCUTANEOUS
  Filled 2024-11-19: qty 0.3

## 2024-11-19 NOTE — Progress Notes (Signed)
 Patient's Hgb 10.1 and blood pressure stable. Patient  tolerated Aranesp  injection with no complaints voiced.  Site clean and dry with no bruising or swelling noted at site.  See MAR for details.  Band aid applied.  Patient stable during and after injection.  Vss with discharge and left in satisfactory condition with no s/s of distress noted. All follow ups as scheduled.   Tara Holmes

## 2024-12-16 ENCOUNTER — Other Ambulatory Visit: Payer: Self-pay | Admitting: Nurse Practitioner

## 2024-12-16 ENCOUNTER — Encounter: Payer: Self-pay | Admitting: *Deleted

## 2024-12-17 ENCOUNTER — Inpatient Hospital Stay

## 2024-12-17 VITALS — BP 130/59 | HR 61 | Resp 18

## 2024-12-17 DIAGNOSIS — D649 Anemia, unspecified: Secondary | ICD-10-CM

## 2024-12-17 DIAGNOSIS — I120 Hypertensive chronic kidney disease with stage 5 chronic kidney disease or end stage renal disease: Secondary | ICD-10-CM | POA: Diagnosis not present

## 2024-12-17 DIAGNOSIS — D631 Anemia in chronic kidney disease: Secondary | ICD-10-CM

## 2024-12-17 LAB — CBC
HCT: 32.5 % — ABNORMAL LOW (ref 36.0–46.0)
Hemoglobin: 9.9 g/dL — ABNORMAL LOW (ref 12.0–15.0)
MCH: 28.5 pg (ref 26.0–34.0)
MCHC: 30.5 g/dL (ref 30.0–36.0)
MCV: 93.7 fL (ref 80.0–100.0)
Platelets: 159 K/uL (ref 150–400)
RBC: 3.47 MIL/uL — ABNORMAL LOW (ref 3.87–5.11)
RDW: 13.3 % (ref 11.5–15.5)
WBC: 5.8 K/uL (ref 4.0–10.5)
nRBC: 0 % (ref 0.0–0.2)

## 2024-12-17 MED ORDER — POTASSIUM CHLORIDE CRYS ER 10 MEQ PO TBCR: 10.0000 meq | EXTENDED_RELEASE_TABLET | Freq: Every day | ORAL | 3 refills | Status: DC

## 2024-12-17 MED ORDER — DARBEPOETIN ALFA 60 MCG/0.3ML IJ SOSY
60.0000 ug | PREFILLED_SYRINGE | Freq: Once | INTRAMUSCULAR | Status: AC
  Administered 2024-12-17: 60 ug via SUBCUTANEOUS
  Filled 2024-12-17: qty 0.3

## 2024-12-17 NOTE — Patient Instructions (Signed)
 [facility] [place] - A DEPT OF [facility]  Discharge Instructions: Thank you for choosing [facility] to provide your oncology and hematology care.  If you have a lab appointment with the Cancer Center - please note that after April 8th, 2024, all labs will be drawn in the cancer center.  You do not have to check in or register with the main entrance as you have in the past but will complete your check-in in the cancer center.  Wear comfortable clothing and clothing appropriate for easy access to any Portacath or PICC line.   We strive to give you quality time with your provider. You may need to reschedule your appointment if you arrive late (15 or more minutes).  Arriving late affects you and other patients whose appointments are after yours.  Also, if you miss three or more appointments without notifying the office, you may be dismissed from the clinic at the provider's discretion.      For prescription refill requests, have your pharmacy contact our office and allow 72 hours for refills to be completed.    Today you received the following Aranesp, return as scheduled.   To help prevent nausea and vomiting after your treatment, we encourage you to take your nausea medication as directed.  BELOW ARE SYMPTOMS THAT SHOULD BE REPORTED IMMEDIATELY: *FEVER GREATER THAN 100.4 F (38 C) OR HIGHER *CHILLS OR SWEATING *NAUSEA AND VOMITING THAT IS NOT CONTROLLED WITH YOUR NAUSEA MEDICATION *UNUSUAL SHORTNESS OF BREATH *UNUSUAL BRUISING OR BLEEDING *URINARY PROBLEMS (pain or burning when urinating, or frequent urination) *BOWEL PROBLEMS (unusual diarrhea, constipation, pain near the anus) TENDERNESS IN MOUTH AND THROAT WITH OR WITHOUT PRESENCE OF ULCERS (sore throat, sores in mouth, or a toothache) UNUSUAL RASH, SWELLING OR PAIN  UNUSUAL VAGINAL DISCHARGE OR ITCHING   Items with * indicate a potential emergency and should be followed up as soon as possible or  go to the Emergency Department if any problems should occur.  Please show the CHEMOTHERAPY ALERT CARD or IMMUNOTHERAPY ALERT CARD at check-in to the Emergency Department and triage nurse.  Should you have questions after your visit or need to cancel or reschedule your appointment, please contact [facility] [place] - A DEPT OF [facility] [phone]  and follow the prompts.  Office hours are 8:00 a.m. to 4:30 p.m. Monday - Friday. Please note that voicemails left after 4:00 p.m. may not be returned until the following business day.  We are closed weekends and major holidays. You have access to a nurse at all times for urgent questions. Please call the main number to the clinic [phone] and follow the prompts.  For any non-urgent questions, you may also contact your provider using MyChart. We now offer e-Visits for anyone 20 and older to request care online for non-urgent symptoms. For details visit [email or web address].   Also download the MyChart app! Go to the app store, search "MyChart", open the app, select [place], and log in with your MyChart username and password.

## 2024-12-17 NOTE — Progress Notes (Signed)
Patient presents today for Aranesp injection. Hemoglobin reviewed prior to administration. VSS. Injection tolerated without incident or complaint. Patient stable during and after injection. See MAR for details. Patient discharged in satisfactory condition with no s/s of distress noted. 

## 2025-01-12 NOTE — Progress Notes (Unsigned)
 "  [facility] [address], [name]   CLINIC:  Medical Oncology/Hematology  PCP:  Maree Isles, MD [address] 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 79 y.o. female returns for routine follow-up of anemia of CKD.   She was last seen by Pleasant Barefoot PA-C on 09/24/2024.  At today's visit, she reports feeling fairly well.  *** She has 70***% energy and 80***% appetite.  ***She endorses that she is maintaining a stable weight.  She is tolerating Aranesp  well without any major side effects.*** Blood pressure has been at goal.***  She  denies any bleeding episodes.*** No fatigue, pica, headaches, lightheadedness, syncope, chest pain, or dyspnea on exertion.   *** Iron tablet was stopped at visit in October 2025.  ***  She was found to have left upper extremity VTE in September 2025, also has AV fistula in same arm.*** No prior DVT/PE.*** She is tolerating Eliquis  well.***  ASSESSMENT & PLAN:  1.  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 Rachele. - Hematology workup showed elevated B12 and elevated MMA (likely artifact in the setting of CKD stage IV/V).  Normal folate. - SPEP negative for M spike.  Minimally elevated FLC ratio 1.80 (kappa 116.0, lambda 64.5), likely artifact in the setting of CKD stage IV/V.  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 - ESA therapy with Retacrit  initiated at [facility] around March 2024 - Switch to Aranesp  in June 2025 (per request of Dr. Rachele).  Currently receiving Aranesp  60 mcg every 4 weeks for Hgb <11.0. - Colonoscopy (2023) by Dr. Donny in [place]. - Iron tablet was stopped in October 2025 visit*** - Denies BRBPR/melena. *** - Labs today (***): *** - PLAN: Hemoglobin at  goal.   Continue Aranesp  60 mcg every 4 weeks for Hgb <11.0.*** - RTC 3 months*** - No indication to restart oral iron or give IV iron.  ***.  2.  Left upper extremity DVT - Patient seen in ED on 08/31/2024 for left arm pain - She has AV fistula in left arm x 4 years, which has never been used - LUE venous US  (09/01/2024): Occlusive left upper extremity DVT and left ulnar vein.  Left upper extremity AV fistula appears patent. - Evaluated by vascular specialist on 10/30/2024: DVT apparently resolved, but they recommended anticoagulation for at least 6 months.  Patient will see vascular specialist again in July 2026 to reevaluate. - Started on Eliquis  on 09/01/2024.  She is tolerating Eliquis  well.*** - No prior DVT/PE - D-dimer (10/22/2024) mildly elevated 0.69 - Coagulopathy workup (10/22/2024): NEGATIVE.  (No lupus anticoagulant, anticardiolipin antibodies, beta-2  glycoprotein antibodies, PT gene mutation, or factor V Leiden mutation) - PLAN: Suspect that upper extremity DVT is related to underlying AVF, as this can cause alterations in blood flow dynamics, increasing risk of thrombosis even if fistula remains patent. - Will also defer to vascular specialist regarding duration of anticoagulation, as we suspect that her DVT is more so related to her fistula than it is to any underlying coagulopathy.  Recommend at least 6 months of anticoagulation, but may consider indefinite anticoagulation if felt to be appropriate per vascular specialist. - Okay to continue Retacrit , per Dr. Davonna (supervising MD)  3.  Social/Family History:  - Lives at home  with her husband.  She independent of ADLS and iADLS. 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 No ETOH. - Mother had breast cancer. Father had colon cancer. No family history of anemia.   PLAN SUMMARY: *** TBD *** >> CBC + Aranesp  every 4 weeks >> Same day labs (CBC/D, CMP, ferritin, iron/TIBC) + OFFICE visit + INJECTION in 3 months      REVIEW OF SYSTEMS:***  Review of Systems  Constitutional:  Negative for appetite change, chills, diaphoresis, fatigue, fever and unexpected weight change.  HENT:   Negative for lump/mass and nosebleeds.   Eyes:  Negative for eye problems.  Respiratory:  Negative for cough, hemoptysis and shortness of breath.   Cardiovascular:  Positive for chest pain (indigestion at times). Negative for leg swelling and palpitations.  Gastrointestinal:  Negative for abdominal pain, blood in stool, constipation, diarrhea, nausea and vomiting.  Genitourinary:  Negative for hematuria.   Skin: Negative.   Neurological:  Negative for dizziness, headaches and light-headedness.  Hematological:  Does not bruise/bleed easily.     PHYSICAL EXAM:***  ECOG PERFORMANCE STATUS: 0 - Asymptomatic  There were no vitals filed for this visit.   There were no vitals filed for this visit.   Physical Exam Constitutional:      Appearance: Normal appearance. She is normal weight.  Cardiovascular:     Rate and Rhythm: Normal rate.     Heart sounds: Normal heart sounds.  Pulmonary:     Breath sounds: Normal breath sounds.  Neurological:     General: No focal deficit present.     Mental Status: Mental status is at baseline.  Psychiatric:        Behavior: Behavior normal. Behavior is cooperative.    PAST MEDICAL/SURGICAL HISTORY:  Past Medical History:  Diagnosis Date   Anemia    Aneurysm 1978   Arthritis    Chronic kidney disease    ESRD   Diabetes (HCC)    Encephalomalacia    GERD (gastroesophageal reflux disease)    Gout    Head injury 10/20/2020   d/t fall   Heart disease    Hypertension    Mild memory disturbance    Seizures (HCC)    Sleep apnea    Past Surgical History:  Procedure Laterality Date   AV FISTULA INSERTION W/ RF MAGNETIC GUIDANCE Left 08/08/2018   Procedure: AV FISTULA INSERTION W/RF MAGNETIC GUIDANCE;  Surgeon: Jama Cordella MATSU, MD;  Location: [facility] INVASIVE CV LAB;   Service: Cardiovascular;  Laterality: Left;   BREAST SURGERY Left    fibroid   TONSILLECTOMY      SOCIAL HISTORY:  Social History   Socioeconomic History   Marital status: Married    Spouse name: Dallas   Number of children: 2   Years of education: MA   Highest education level: Not on file  Occupational History   Occupation: Retired  Tobacco Use   Smoking status: Never   Smokeless tobacco: Never  Vaping Use   Vaping status: Never Used  Substance and Sexual Activity   Alcohol use: No   Drug use: Never   Sexual activity: Not on file  Other Topics Concern   Not on file  Social History Narrative   Patient lives at home with spouse.   Caffeine Use: none   Social Drivers of Health   Tobacco Use: Low Risk (12/17/2024)   Patient History    Smoking Tobacco Use: Never    Smokeless Tobacco Use: Never  Passive Exposure: Not on file  Financial Resource Strain: Not on file  Food Insecurity: Not on file  Transportation Needs: Not on file  Physical Activity: Not on file  Stress: Not on file  Social Connections: Not on file  Intimate Partner Violence: Not on file  Depression (PHQ2-9): Low Risk (09/24/2024)   Depression (PHQ2-9)    PHQ-2 Score: 0  Alcohol Screen: Not on file  Housing: Not on file  Utilities: Not on file  Health Literacy: Not on file    FAMILY HISTORY:  Family History  Problem Relation Age of Onset   Heart attack Mother    Diabetes Mother    Colon cancer Father     CURRENT MEDICATIONS:  Outpatient Encounter Medications as of 01/15/2025  Medication Sig   ACCU-CHEK COMPACT PLUS test strip CHECK BLOOD SUGAR ONCE A DAY   allopurinol (ZYLOPRIM) 100 MG tablet Take 100 mg by mouth daily.   amLODipine (NORVASC) 2.5 MG tablet Take 2.5 mg by mouth daily.   apixaban  (ELIQUIS ) 5 MG TABS tablet Take 1 tablet (5 mg total) by mouth 2 (two) times daily.   carvedilol (COREG) 25 MG tablet Take 25 mg by mouth 2 (two) times daily.    cholecalciferol (VITAMIN D3) 25  MCG (1000 UNIT) tablet Take 1,000 Units by mouth daily.   cinacalcet (SENSIPAR) 30 MG tablet Take 30 mg by mouth 3 (three) times a week. M W F   ezetimibe (ZETIA) 10 MG tablet Take 10 mg by mouth daily.   furosemide (LASIX) 40 MG tablet Take 40 mg by mouth daily.   losartan (COZAAR) 100 MG tablet Take 25 mg by mouth daily.   oxybutynin (DITROPAN) 5 MG tablet Take 5 mg by mouth 2 (two) times daily.    potassium chloride  (KLOR-CON  M) 10 MEQ tablet Take 1 tablet (10 mEq total) by mouth daily.   rosuvastatin  (CRESTOR ) 10 MG tablet Take 10 mg by mouth daily.   No facility-administered encounter medications on file as of 01/15/2025.    ALLERGIES:  Allergies  Allergen Reactions   Amoxicillin Other (See Comments)    Pt states allergic reaction in vaginal region. Has patient had a PCN reaction causing immediate rash, facial/tongue/throat swelling, SOB or lightheadedness with hypotension: No Has patient had a PCN reaction causing severe rash involving mucus membranes or skin necrosis: No Has patient had a PCN reaction that required hospitalization: No Has patient had a PCN reaction occurring within the last 10 years: No Yeast infection reaction ONLY If all of the above answers are NO, then may proceed with Ce   Atorvastatin Other (See Comments)    MUSCLE ACHES  Other Reaction(s): Not available  Lipitor   Prednisone  Other (See Comments)    ELEVATED BLOOD SUGAR   Iodinated Contrast Media Other (See Comments)    PATIENT IS UNAWARE OF THIS ALLERGY OR ANY REACTION TO THIS MEDICATION. Other reaction(s): Other (See Comments) PATIENT IS UNAWARE OF THIS ALLERGY OR ANY REACTION TO THIS MEDICATION.    LABORATORY DATA:  I have reviewed the labs as listed.  CBC    Component Value Date/Time   WBC 5.8 12/17/2024 0907   RBC 3.47 (L) 12/17/2024 0907   HGB 9.9 (L) 12/17/2024 0907   HCT 32.5 (L) 12/17/2024 0907   PLT 159 12/17/2024 0907   MCV 93.7 12/17/2024 0907   MCH 28.5 12/17/2024 0907    MCHC 30.5 12/17/2024 0907   RDW 13.3 12/17/2024 0907   LYMPHSABS 0.8 09/24/2024 0839   MONOABS 0.5  09/24/2024 0839   EOSABS 0.0 09/24/2024 0839   BASOSABS 0.0 09/24/2024 0839      Latest Ref Rng & Units 09/24/2024    8:39 AM 08/08/2024    8:24 AM 07/02/2024    1:38 PM  CMP  Glucose 70 - 99 mg/dL 772  879  98   BUN 8 - 23 mg/dL 92  67  67   Creatinine 0.44 - 1.00 mg/dL 6.80  6.34  7.13   Sodium 135 - 145 mmol/L 140  140  140   Potassium 3.5 - 5.1 mmol/L 3.4  4.1  4.6   Chloride 98 - 111 mmol/L 98  99  102   CO2 22 - 32 mmol/L 27  27  26    Calcium  8.9 - 10.3 mg/dL 9.6  9.4  9.4   Total Protein 6.5 - 8.1 g/dL 7.4     Total Bilirubin 0.0 - 1.2 mg/dL 0.5     Alkaline Phos 38 - 126 U/L 81     AST 15 - 41 U/L 23     ALT 0 - 44 U/L 44       DIAGNOSTIC IMAGING:  I have independently reviewed the relevant imaging and discussed with the patient.   WRAP UP:  All questions were answered. The patient knows to call the clinic with any problems, questions or concerns.  Medical decision making: ***  Time spent on visit: I spent *** minutes counseling the patient face to face. The total time spent in the appointment was *** minutes and more than 50% was on counseling.  Pleasant CHRISTELLA Barefoot, PA-C  *** "

## 2025-01-14 ENCOUNTER — Inpatient Hospital Stay: Admitting: Physician Assistant

## 2025-01-14 ENCOUNTER — Inpatient Hospital Stay

## 2025-01-14 NOTE — Progress Notes (Unsigned)
 "  [facility] [address], [name]   CLINIC:  Medical Oncology/Hematology  PCP:  Maree Isles, MD [address] 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 female returns for routine follow-up of anemia of CKD.   She was last seen by Pleasant Barefoot PA-C on 09/24/2024.  At today's visit, she reports feeling fairly well.  *** She has 70***% energy and 80***% appetite.  ***She endorses that she is maintaining a stable weight.  She is tolerating Aranesp  well without any major side effects.*** Blood pressure has been at goal.***  She  denies any bleeding episodes.*** No fatigue, pica, headaches, lightheadedness, syncope, chest pain, or dyspnea on exertion.   *** Iron tablet was stopped at visit in October 2025.  ***  She was found to have left upper extremity VTE in September 2025, also has AV fistula in same arm.*** No prior DVT/PE.*** She is tolerating Eliquis  well.***  ASSESSMENT & PLAN:  1.  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 Rachele. - Hematology workup showed elevated B12 and elevated MMA (likely artifact in the setting of CKD stage IV/V).  Normal folate. - SPEP negative for M spike.  Minimally elevated FLC ratio 1.80 (kappa 116.0, lambda 64.5), likely artifact in the setting of CKD stage IV/V.  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 - ESA therapy with Retacrit  initiated at [facility] around March 2024 - Switch to Aranesp  in June 2025 (per request of Dr. Rachele).  Currently receiving Aranesp  60 mcg every 4 weeks for Hgb <11.0. - Colonoscopy (2023) by Dr. Donny in [place]. - Iron tablet was stopped in October 2025 visit*** - Denies BRBPR/melena. *** - Labs today (***): *** - PLAN: Hemoglobin at  goal.   Continue Aranesp  60 mcg every 4 weeks for Hgb <11.0.*** - RTC 3 months*** - No indication to restart oral iron or give IV iron.  ***.  2.  Left upper extremity DVT - Patient seen in ED on 08/31/2024 for left arm pain - She has AV fistula in left arm x 4 years, which has never been used - LUE venous US  (09/01/2024): Occlusive left upper extremity DVT and left ulnar vein.  Left upper extremity AV fistula appears patent. - Evaluated by vascular specialist on 10/30/2024: DVT apparently resolved, but they recommended anticoagulation for at least 6 months.  Patient will see vascular specialist again in July 2026 to reevaluate. - Started on Eliquis  on 09/01/2024.  She is tolerating Eliquis  well.*** - No prior DVT/PE - D-dimer (10/22/2024) mildly elevated 0.69 - Coagulopathy workup (10/22/2024): NEGATIVE.  (No lupus anticoagulant, anticardiolipin antibodies, beta-2  glycoprotein antibodies, PT gene mutation, or factor V Leiden mutation) - PLAN: Suspect that upper extremity DVT is related to underlying AVF, as this can cause alterations in blood flow dynamics, increasing risk of thrombosis even if fistula remains patent. - Will also defer to vascular specialist regarding duration of anticoagulation, as we suspect that her DVT is more so related to her fistula than it is to any underlying coagulopathy.  Recommend at least 6 months of anticoagulation, but may consider indefinite anticoagulation if felt to be appropriate per vascular specialist. - Okay to continue Retacrit , per Dr. Davonna (supervising MD)  3.  Social/Family History:  - Lives at home  with her husband.  She independent of ADLS and iADLS. 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 No ETOH. - Mother had breast cancer. Father had colon cancer. No family history of anemia.   PLAN SUMMARY: *** TBD *** >> CBC + Aranesp  every 4 weeks >> Same day labs (CBC/D, CMP, ferritin, iron/TIBC) + OFFICE visit + INJECTION in 3 months      REVIEW OF SYSTEMS:***  Review of Systems  Constitutional:  Negative for appetite change, chills, diaphoresis, fatigue, fever and unexpected weight change.  HENT:   Negative for lump/mass and nosebleeds.   Eyes:  Negative for eye problems.  Respiratory:  Negative for cough, hemoptysis and shortness of breath.   Cardiovascular:  Positive for chest pain (indigestion at times). Negative for leg swelling and palpitations.  Gastrointestinal:  Negative for abdominal pain, blood in stool, constipation, diarrhea, nausea and vomiting.  Genitourinary:  Negative for hematuria.   Skin: Negative.   Neurological:  Negative for dizziness, headaches and light-headedness.  Hematological:  Does not bruise/bleed easily.     PHYSICAL EXAM:***  ECOG PERFORMANCE STATUS: 0 - Asymptomatic  There were no vitals filed for this visit.   There were no vitals filed for this visit.   Physical Exam Constitutional:      Appearance: Normal appearance. She is normal weight.  Cardiovascular:     Rate and Rhythm: Normal rate.     Heart sounds: Normal heart sounds.  Pulmonary:     Breath sounds: Normal breath sounds.  Neurological:     General: No focal deficit present.     Mental Status: Mental status is at baseline.  Psychiatric:        Behavior: Behavior normal. Behavior is cooperative.    PAST MEDICAL/SURGICAL HISTORY:  Past Medical History:  Diagnosis Date   Anemia    Aneurysm 1978   Arthritis    Chronic kidney disease    ESRD   Diabetes (HCC)    Encephalomalacia    GERD (gastroesophageal reflux disease)    Gout    Head injury 10/20/2020   d/t fall   Heart disease    Hypertension    Mild memory disturbance    Seizures (HCC)    Sleep apnea    Past Surgical History:  Procedure Laterality Date   AV FISTULA INSERTION W/ RF MAGNETIC GUIDANCE Left 08/08/2018   Procedure: AV FISTULA INSERTION W/RF MAGNETIC GUIDANCE;  Surgeon: Jama Cordella MATSU, MD;  Location: [facility] INVASIVE CV LAB;   Service: Cardiovascular;  Laterality: Left;   BREAST SURGERY Left    fibroid   TONSILLECTOMY      SOCIAL HISTORY:  Social History   Socioeconomic History   Marital status: Married    Spouse name: [place]   Number of children: 2   Years of education: MA   Highest education level: Not on file  Occupational History   Occupation: Retired  Tobacco Use   Smoking status: Never   Smokeless tobacco: Never  Vaping Use   Vaping status: Never Used  Substance and Sexual Activity   Alcohol use: No   Drug use: Never   Sexual activity: Not on file  Other Topics Concern   Not on file  Social History Narrative   Patient lives at home with spouse.   Caffeine Use: none   Social Drivers of Health   Tobacco Use: Low Risk (12/17/2024)   Patient History    Smoking Tobacco Use: Never    Smokeless Tobacco Use: Never  Passive Exposure: Not on file  Financial Resource Strain: Not on file  Food Insecurity: Not on file  Transportation Needs: Not on file  Physical Activity: Not on file  Stress: Not on file  Social Connections: Not on file  Intimate Partner Violence: Not on file  Depression (PHQ2-9): Low Risk (09/24/2024)   Depression (PHQ2-9)    PHQ-2 Score: 0  Alcohol Screen: Not on file  Housing: Not on file  Utilities: Not on file  Health Literacy: Not on file    FAMILY HISTORY:  Family History  Problem Relation Age of Onset   Heart attack Mother    Diabetes Mother    Colon cancer Father     CURRENT MEDICATIONS:  Outpatient Encounter Medications as of 01/15/2025  Medication Sig   ACCU-CHEK COMPACT PLUS test strip CHECK BLOOD SUGAR ONCE A DAY   allopurinol (ZYLOPRIM) 100 MG tablet Take 100 mg by mouth daily.   amLODipine (NORVASC) 2.5 MG tablet Take 2.5 mg by mouth daily.   apixaban  (ELIQUIS ) 5 MG TABS tablet Take 1 tablet (5 mg total) by mouth 2 (two) times daily.   carvedilol (COREG) 25 MG tablet Take 25 mg by mouth 2 (two) times daily.    cholecalciferol (VITAMIN D3) 25  MCG (1000 UNIT) tablet Take 1,000 Units by mouth daily.   cinacalcet (SENSIPAR) 30 MG tablet Take 30 mg by mouth 3 (three) times a week. M W F   ezetimibe (ZETIA) 10 MG tablet Take 10 mg by mouth daily.   furosemide (LASIX) 40 MG tablet Take 40 mg by mouth daily.   losartan (COZAAR) 100 MG tablet Take 25 mg by mouth daily.   oxybutynin (DITROPAN) 5 MG tablet Take 5 mg by mouth 2 (two) times daily.    potassium chloride  (KLOR-CON  M) 10 MEQ tablet Take 1 tablet (10 mEq total) by mouth daily.   rosuvastatin  (CRESTOR ) 10 MG tablet Take 10 mg by mouth daily.   No facility-administered encounter medications on file as of 01/15/2025.    ALLERGIES:  Allergies  Allergen Reactions   Amoxicillin Other (See Comments)    Pt states allergic reaction in vaginal region. Has patient had a PCN reaction causing immediate rash, facial/tongue/throat swelling, SOB or lightheadedness with hypotension: No Has patient had a PCN reaction causing severe rash involving mucus membranes or skin necrosis: No Has patient had a PCN reaction that required hospitalization: No Has patient had a PCN reaction occurring within the last 10 years: No Yeast infection reaction ONLY If all of the above answers are NO, then may proceed with Ce   Atorvastatin Other (See Comments)    MUSCLE ACHES  Other Reaction(s): Not available  Lipitor   Prednisone  Other (See Comments)    ELEVATED BLOOD SUGAR   Iodinated Contrast Media Other (See Comments)    PATIENT IS UNAWARE OF THIS ALLERGY OR ANY REACTION TO THIS MEDICATION. Other reaction(s): Other (See Comments) PATIENT IS UNAWARE OF THIS ALLERGY OR ANY REACTION TO THIS MEDICATION.    LABORATORY DATA:  I have reviewed the labs as listed.  CBC    Component Value Date/Time   WBC 5.8 12/17/2024 0907   RBC 3.47 (L) 12/17/2024 0907   HGB 9.9 (L) 12/17/2024 0907   HCT 32.5 (L) 12/17/2024 0907   PLT 159 12/17/2024 0907   MCV 93.7 12/17/2024 0907   MCH 28.5 12/17/2024 0907    MCHC 30.5 12/17/2024 0907   RDW 13.3 12/17/2024 0907   LYMPHSABS 0.8 09/24/2024 0839   MONOABS 0.5  09/24/2024 0839   EOSABS 0.0 09/24/2024 0839   BASOSABS 0.0 09/24/2024 0839      Latest Ref Rng & Units 09/24/2024    8:39 AM 08/08/2024    8:24 AM 07/02/2024    1:38 PM  CMP  Glucose 70 - 99 mg/dL 772  879  98   BUN 8 - 23 mg/dL 92  67  67   Creatinine 0.44 - 1.00 mg/dL 6.80  6.34  7.13   Sodium 135 - 145 mmol/L 140  140  140   Potassium 3.5 - 5.1 mmol/L 3.4  4.1  4.6   Chloride 98 - 111 mmol/L 98  99  102   CO2 22 - 32 mmol/L 27  27  26    Calcium  8.9 - 10.3 mg/dL 9.6  9.4  9.4   Total Protein 6.5 - 8.1 g/dL 7.4     Total Bilirubin 0.0 - 1.2 mg/dL 0.5     Alkaline Phos 38 - 126 U/L 81     AST 15 - 41 U/L 23     ALT 0 - 44 U/L 44       DIAGNOSTIC IMAGING:  I have independently reviewed the relevant imaging and discussed with the patient.   WRAP UP:  All questions were answered. The patient knows to call the clinic with any problems, questions or concerns.  Medical decision making: ***  Time spent on visit: I spent *** minutes counseling the patient face to face. The total time spent in the appointment was *** minutes and more than 50% was on counseling.  Pleasant CHRISTELLA Barefoot, PA-C  *** "

## 2025-01-15 ENCOUNTER — Inpatient Hospital Stay

## 2025-01-15 ENCOUNTER — Inpatient Hospital Stay: Attending: Physician Assistant

## 2025-01-15 ENCOUNTER — Other Ambulatory Visit: Payer: Self-pay

## 2025-01-15 ENCOUNTER — Inpatient Hospital Stay: Admitting: Physician Assistant

## 2025-01-15 ENCOUNTER — Encounter: Payer: Self-pay | Admitting: Physician Assistant

## 2025-01-15 VITALS — BP 136/77 | HR 63 | Temp 97.9°F | Resp 18 | Ht 63.0 in | Wt 148.0 lb

## 2025-01-15 DIAGNOSIS — E1122 Type 2 diabetes mellitus with diabetic chronic kidney disease: Secondary | ICD-10-CM | POA: Diagnosis present

## 2025-01-15 DIAGNOSIS — I120 Hypertensive chronic kidney disease with stage 5 chronic kidney disease or end stage renal disease: Secondary | ICD-10-CM | POA: Diagnosis present

## 2025-01-15 DIAGNOSIS — D631 Anemia in chronic kidney disease: Secondary | ICD-10-CM | POA: Insufficient documentation

## 2025-01-15 DIAGNOSIS — D649 Anemia, unspecified: Secondary | ICD-10-CM

## 2025-01-15 DIAGNOSIS — N184 Chronic kidney disease, stage 4 (severe): Secondary | ICD-10-CM | POA: Diagnosis not present

## 2025-01-15 DIAGNOSIS — N185 Chronic kidney disease, stage 5: Secondary | ICD-10-CM | POA: Diagnosis present

## 2025-01-15 LAB — CBC WITH DIFFERENTIAL/PLATELET
Abs Immature Granulocytes: 0.01 10*3/uL (ref 0.00–0.07)
Basophils Absolute: 0 10*3/uL (ref 0.0–0.1)
Basophils Relative: 0 %
Eosinophils Absolute: 0.2 10*3/uL (ref 0.0–0.5)
Eosinophils Relative: 3 %
HCT: 33.8 % — ABNORMAL LOW (ref 36.0–46.0)
Hemoglobin: 10.3 g/dL — ABNORMAL LOW (ref 12.0–15.0)
Immature Granulocytes: 0 %
Lymphocytes Relative: 32 %
Lymphs Abs: 1.6 10*3/uL (ref 0.7–4.0)
MCH: 28.2 pg (ref 26.0–34.0)
MCHC: 30.5 g/dL (ref 30.0–36.0)
MCV: 92.6 fL (ref 80.0–100.0)
Monocytes Absolute: 0.7 10*3/uL (ref 0.1–1.0)
Monocytes Relative: 13 %
Neutro Abs: 2.6 10*3/uL (ref 1.7–7.7)
Neutrophils Relative %: 52 %
Platelets: 148 10*3/uL — ABNORMAL LOW (ref 150–400)
RBC: 3.65 MIL/uL — ABNORMAL LOW (ref 3.87–5.11)
RDW: 13.3 % (ref 11.5–15.5)
WBC: 5 10*3/uL (ref 4.0–10.5)
nRBC: 0 % (ref 0.0–0.2)

## 2025-01-15 LAB — COMPREHENSIVE METABOLIC PANEL WITH GFR
ALT: 108 U/L — ABNORMAL HIGH (ref 0–44)
AST: 97 U/L — ABNORMAL HIGH (ref 15–41)
Albumin: 4.1 g/dL (ref 3.5–5.0)
Alkaline Phosphatase: 93 U/L (ref 38–126)
Anion gap: 14 (ref 5–15)
BUN: 76 mg/dL — ABNORMAL HIGH (ref 8–23)
CO2: 26 mmol/L (ref 22–32)
Calcium: 9.8 mg/dL (ref 8.9–10.3)
Chloride: 98 mmol/L (ref 98–111)
Creatinine, Ser: 3.33 mg/dL — ABNORMAL HIGH (ref 0.44–1.00)
GFR, Estimated: 14 mL/min — ABNORMAL LOW
Glucose, Bld: 111 mg/dL — ABNORMAL HIGH (ref 70–99)
Potassium: 4.2 mmol/L (ref 3.5–5.1)
Sodium: 139 mmol/L (ref 135–145)
Total Bilirubin: 0.6 mg/dL (ref 0.0–1.2)
Total Protein: 7.5 g/dL (ref 6.5–8.1)

## 2025-01-15 LAB — IRON AND TIBC
Iron: 67 ug/dL (ref 28–170)
Saturation Ratios: 27 % (ref 10.4–31.8)
TIBC: 251 ug/dL (ref 250–450)
UIBC: 184 ug/dL

## 2025-01-15 LAB — FERRITIN: Ferritin: 737 ng/mL — ABNORMAL HIGH (ref 11–307)

## 2025-01-15 MED ORDER — DARBEPOETIN ALFA 60 MCG/0.3ML IJ SOSY
60.0000 ug | PREFILLED_SYRINGE | Freq: Once | INTRAMUSCULAR | Status: AC
  Administered 2025-01-15: 60 ug via SUBCUTANEOUS
  Filled 2025-01-15: qty 0.3

## 2025-01-15 NOTE — Patient Instructions (Signed)
 [facility] [place] - A DEPT OF [facility]  Discharge Instructions: Thank you for choosing [facility] to provide your oncology and hematology care.  If you have a lab appointment with the Cancer Center - please note that after April 8th, 2024, all labs will be drawn in the cancer center.  You do not have to check in or register with the main entrance as you have in the past but will complete your check-in in the cancer center.  Wear comfortable clothing and clothing appropriate for easy access to any Portacath or PICC line.   We strive to give you quality time with your provider. You may need to reschedule your appointment if you arrive late (15 or more minutes).  Arriving late affects you and other patients whose appointments are after yours.  Also, if you miss three or more appointments without notifying the office, you may be dismissed from the clinic at the provider's discretion.      For prescription refill requests, have your pharmacy contact our office and allow 72 hours for refills to be completed.    Today you received the following Aranesp, return as scheduled.   To help prevent nausea and vomiting after your treatment, we encourage you to take your nausea medication as directed.  BELOW ARE SYMPTOMS THAT SHOULD BE REPORTED IMMEDIATELY: *FEVER GREATER THAN 100.4 F (38 C) OR HIGHER *CHILLS OR SWEATING *NAUSEA AND VOMITING THAT IS NOT CONTROLLED WITH YOUR NAUSEA MEDICATION *UNUSUAL SHORTNESS OF BREATH *UNUSUAL BRUISING OR BLEEDING *URINARY PROBLEMS (pain or burning when urinating, or frequent urination) *BOWEL PROBLEMS (unusual diarrhea, constipation, pain near the anus) TENDERNESS IN MOUTH AND THROAT WITH OR WITHOUT PRESENCE OF ULCERS (sore throat, sores in mouth, or a toothache) UNUSUAL RASH, SWELLING OR PAIN  UNUSUAL VAGINAL DISCHARGE OR ITCHING   Items with * indicate a potential emergency and should be followed up as soon as possible or  go to the Emergency Department if any problems should occur.  Please show the CHEMOTHERAPY ALERT CARD or IMMUNOTHERAPY ALERT CARD at check-in to the Emergency Department and triage nurse.  Should you have questions after your visit or need to cancel or reschedule your appointment, please contact [facility] [place] - A DEPT OF [facility] [phone]  and follow the prompts.  Office hours are 8:00 a.m. to 4:30 p.m. Monday - Friday. Please note that voicemails left after 4:00 p.m. may not be returned until the following business day.  We are closed weekends and major holidays. You have access to a nurse at all times for urgent questions. Please call the main number to the clinic [phone] and follow the prompts.  For any non-urgent questions, you may also contact your provider using MyChart. We now offer e-Visits for anyone 20 and older to request care online for non-urgent symptoms. For details visit [email or web address].   Also download the MyChart app! Go to the app store, search "MyChart", open the app, select [place], and log in with your MyChart username and password.

## 2025-01-15 NOTE — Patient Instructions (Signed)
 [facility] at [facility] **VISIT SUMMARY & IMPORTANT INSTRUCTIONS **   You were seen today by Pleasant Barefoot PA-C for your follow-up visit.      ANEMIA FROM CHRONIC KIDNEY DISEASE Chronic kidney disease makes it difficult for your body to make new blood cells. Your blood levels are lower than they need to be. We will continue your Aranesp  injections every 4 weeks.   You can STOP your iron supplement at this time.  FOLLOW-UP APPOINTMENT: 3 months  ** Thank you for trusting me with your healthcare!  I strive to provide all of my patients with quality care at each visit.  If you receive a survey for this visit, I would be so grateful to you for taking the time to provide feedback.  Thank you in advance!  ~ Justn Quale                                        Dr. Mickiel Davonna Pleasant Barefoot, PA-C    Delon Hope, NP   - - - - - - - - - - - - - - - - - -     Thank you for choosing [facility] at [facility] to provide your oncology and hematology care.  To afford each patient quality time with our provider, please arrive at least 15 minutes before your scheduled appointment time.   If you have a lab appointment with the Cancer Center please come in thru the Main Entrance and check in at the main information desk.  You need to re-schedule your appointment should you arrive 10 or more minutes late.  We strive to give you quality time with our providers, and arriving late affects you and other patients whose appointments are after yours.  Also, if you no show three or more times for appointments you may be dismissed from the clinic at the providers discretion.     Again, thank you for choosing [facility].  Our hope is that these requests will decrease the amount of time that you wait before being seen by our physicians.       _____________________________________________________________  Should you have questions after your  visit to [facility], please contact our office at [phone] and follow the prompts.  Our office hours are 8:00 a.m. and 4:30 p.m. Monday - Friday.  Please note that voicemails left after 4:00 p.m. may not be returned until the following business day.  We are closed weekends and major holidays.  You do have access to a nurse 24-7, just call the main number to the clinic [phone] and do not press any options, hold on the line and a nurse will answer the phone.    For prescription refill requests, have your pharmacy contact our office and allow 72 hours.

## 2025-01-15 NOTE — Progress Notes (Signed)
Patient presents today for Aranesp injection. Hemoglobin reviewed prior to administration. VSS. Injection tolerated without incident or complaint. Patient stable during and after injection. See MAR for details. Patient discharged in satisfactory condition with no s/s of distress noted. 

## 2025-01-22 MED ORDER — POTASSIUM CHLORIDE CRYS ER 10 MEQ PO TBCR: 10.0000 meq | EXTENDED_RELEASE_TABLET | Freq: Every day | ORAL | 0 refills | Status: AC

## 2025-02-12 ENCOUNTER — Inpatient Hospital Stay

## 2025-03-12 ENCOUNTER — Inpatient Hospital Stay

## 2025-03-18 ENCOUNTER — Ambulatory Visit: Admitting: Internal Medicine

## 2025-04-09 ENCOUNTER — Inpatient Hospital Stay

## 2025-04-09 ENCOUNTER — Inpatient Hospital Stay: Admitting: Physician Assistant

## 2025-07-03 ENCOUNTER — Encounter (INDEPENDENT_AMBULATORY_CARE_PROVIDER_SITE_OTHER)

## 2025-07-03 ENCOUNTER — Ambulatory Visit (INDEPENDENT_AMBULATORY_CARE_PROVIDER_SITE_OTHER): Admitting: Vascular Surgery
